# Patient Record
Sex: Female | Born: 1975 | Hispanic: No | Marital: Single | Smoking: Never smoker
Health system: Southern US, Community
[De-identification: ages and names within clinical notes are randomized; demographics above are authoritative.]

## PROBLEM LIST (undated history)

## (undated) DIAGNOSIS — G373 Acute transverse myelitis in demyelinating disease of central nervous system: Secondary | ICD-10-CM

## (undated) DIAGNOSIS — G8220 Paraplegia, unspecified: Secondary | ICD-10-CM

## (undated) DIAGNOSIS — E669 Obesity, unspecified: Secondary | ICD-10-CM

## (undated) DIAGNOSIS — Z8739 Personal history of other diseases of the musculoskeletal system and connective tissue: Secondary | ICD-10-CM

## (undated) HISTORY — PX: OTHER SURGICAL HISTORY: SHX169

## (undated) HISTORY — PX: TUBAL LIGATION: SHX77

## (undated) HISTORY — PX: KNEE ARTHROSCOPY: SHX127

---

## 2012-11-11 ENCOUNTER — Encounter (HOSPITAL_COMMUNITY): Payer: Self-pay

## 2012-11-11 ENCOUNTER — Emergency Department (HOSPITAL_COMMUNITY)
Admission: EM | Admit: 2012-11-11 | Discharge: 2012-11-11 | Disposition: A | Discharge status: Home / Self Care | Payer: Self-pay | Attending: Emergency Medicine | Admitting: Emergency Medicine

## 2012-11-11 DIAGNOSIS — Z3202 Encounter for pregnancy test, result negative: Secondary | ICD-10-CM | POA: Insufficient documentation

## 2012-11-11 DIAGNOSIS — N926 Irregular menstruation, unspecified: Secondary | ICD-10-CM | POA: Insufficient documentation

## 2012-11-11 DIAGNOSIS — R51 Headache: Secondary | ICD-10-CM | POA: Insufficient documentation

## 2012-11-11 DIAGNOSIS — R040 Epistaxis: Secondary | ICD-10-CM | POA: Insufficient documentation

## 2012-11-11 LAB — CBC WITH DIFFERENTIAL/PLATELET
Basophils Absolute: 0 10*3/uL (ref 0.0–0.1)
Basophils Relative: 0 % (ref 0–1)
Eosinophils Absolute: 0 10*3/uL (ref 0.0–0.7)
Eosinophils Relative: 0 % (ref 0–5)
HCT: 37 % (ref 36.0–46.0)
MCH: 25 pg — ABNORMAL LOW (ref 26.0–34.0)
MCHC: 31.1 g/dL (ref 30.0–36.0)
MCV: 80.4 fL (ref 78.0–100.0)
Monocytes Absolute: 0.5 10*3/uL (ref 0.1–1.0)
Platelets: 215 10*3/uL (ref 150–400)
RDW: 13.6 % (ref 11.5–15.5)

## 2012-11-11 NOTE — ED Notes (Signed)
Dr Beaton at bedside 

## 2012-11-11 NOTE — ED Notes (Signed)
Pt c/o daily nose bleeds x2 months, headache "on and off" x1 month, and has not had a menstrual cycle since 09/02/2012. Pt denies using any at home pregnancy test, had a tubal ligation 8 years ago. Pt had her (L) nostril cauterized 2 years ago.

## 2012-11-11 NOTE — ED Notes (Signed)
Pt c/o chronic nosebleeds; pt states she had nosebleed last night and denies bleeding today; pt alert and mentating appropriately; pt denies dizziness and lightheadedness; pt denies numbness and tingling; pt denies headaches and change in vision; pt states left foot tingles before nosebleed starts; pt states she has always had period after tubal ligation and has no had one since 09/02/2012

## 2012-11-11 NOTE — ED Notes (Signed)
Pt alert and mentating appropriately upon d/c; pt given d/c teaching and follow up care instructions; pt verbalizes understanding and has no further questions upon d/c. Pt ambulatory upon d/c. NAD noted upon d/c.

## 2012-11-14 NOTE — ED Provider Notes (Addendum)
History     CSN: [id number]  Arrival date & time 11/11/12  39   First MD Initiated Contact with Patient 11/11/12 1523      Chief Complaint  Patient presents with  . Hematemesis  . Headache    (Consider location/radiation/quality/duration/timing/severity/associated sxs/prior treatment) HPI Pt c/o daily nose bleeds x2 months, headache "on and off" x1 month, and has not had a menstrual cycle since 09/02/2012. Pt denies using any at home pregnancy test, had a tubal ligation 8 years ago. Pt had her (L) nostril cauterized 2 years ago History reviewed. No pertinent past medical history.  Past Surgical History  Procedure Laterality Date  . Knee arthroscopy    . Fibroid tumor    . Tubal ligation      History reviewed. No pertinent family history.  History  Substance Use Topics  . Smoking status: Never Smoker   . Smokeless tobacco: Not on file  . Alcohol Use: No    OB History   Grav Para Term Preterm Abortions TAB SAB Ect Mult Living                  Review of Systems  Constitutional: Negative for fever and fatigue.  HENT: Positive for nosebleeds. Negative for ear pain and congestion.   Hematological: Does not bruise/bleed easily.   Pt c/o daily nose bleeds x2 months, headache "on and off" x1 month, and has not had a menstrual cycle since 09/02/2012. Pt denies using any at home pregnancy test, had a tubal ligation 8 years ago. Pt had her (L) nostril cauterized 2 years ago  Allergies  Review of patient's allergies indicates no known allergies.  Home Medications  No current outpatient prescriptions on file.  BP 103/58  Pulse 72  Temp(Src) 98.8 F (37.1 C) (Oral)  Resp 18  Ht 5' (1.524 m)  Wt 247 lb (112.038 kg)  BMI 48.24 kg/m2  SpO2 100%  LMP 09/02/2012  Physical Exam  Nursing note and vitals reviewed. Constitutional: She is oriented to person, place, and time. She appears well-developed and well-nourished. No distress.  HENT:  Head: Normocephalic and  atraumatic.  Nose: No nasal deformity or nasal septal hematoma. No epistaxis (Friable area noted R septum).  Eyes: Pupils are equal, round, and reactive to light.  Neck: Normal range of motion.  Cardiovascular: Normal rate and intact distal pulses.   Pulmonary/Chest: No respiratory distress.  Abdominal: Normal appearance. She exhibits no distension.  Musculoskeletal: Normal range of motion.  Neurological: She is alert and oriented to person, place, and time. No cranial nerve deficit.  Skin: Skin is warm and dry. No rash noted.  Psychiatric: She has a normal mood and affect. Her behavior is normal.    ED Course  EPISTAXIS MANAGEMENT Date/Time: 11/14/2012 1:10 AM Performed by: Dot Lanes Authorized by: Dot Lanes Consent: Verbal consent obtained. The procedure was performed in an emergent situation. Risks and benefits: risks, benefits and alternatives were discussed Patient understanding: patient states understanding of the procedure being performed Patient identity confirmed: verbally with patient Patient sedated: no Treatment site: right anterior Repair method: suction and silver nitrate Post-procedure assessment: bleeding stopped Treatment complexity: simple Recurrence: recurrence of recent bleed Patient tolerance: Patient tolerated the procedure well with no immediate complications.   (including critical care time)  Labs Reviewed  CBC WITH DIFFERENTIAL - Abnormal; Notable for the following:    Hemoglobin 11.5 (*)    MCH 25.0 (*)    All other components within normal limits  POCT  PREGNANCY, URINE   No results found.   1. Epistaxis       MDM          Dot Lanes, MD 11/14/12 [id number]  Dot Lanes, MD 12/02/12 2121

## 2015-01-19 ENCOUNTER — Emergency Department (HOSPITAL_COMMUNITY)
Admission: EM | Admit: 2015-01-19 | Discharge: 2015-01-19 | Disposition: A | Discharge status: Home / Self Care | Payer: PRIVATE HEALTH INSURANCE | Attending: Emergency Medicine | Admitting: Emergency Medicine

## 2015-01-19 ENCOUNTER — Encounter (HOSPITAL_COMMUNITY): Payer: Self-pay | Admitting: *Deleted

## 2015-01-19 DIAGNOSIS — X58XXXA Exposure to other specified factors, initial encounter: Secondary | ICD-10-CM | POA: Diagnosis not present

## 2015-01-19 DIAGNOSIS — Y998 Other external cause status: Secondary | ICD-10-CM | POA: Insufficient documentation

## 2015-01-19 DIAGNOSIS — R350 Frequency of micturition: Secondary | ICD-10-CM | POA: Diagnosis not present

## 2015-01-19 DIAGNOSIS — Y9289 Other specified places as the place of occurrence of the external cause: Secondary | ICD-10-CM | POA: Insufficient documentation

## 2015-01-19 DIAGNOSIS — Y9389 Activity, other specified: Secondary | ICD-10-CM | POA: Insufficient documentation

## 2015-01-19 DIAGNOSIS — M79605 Pain in left leg: Secondary | ICD-10-CM | POA: Diagnosis not present

## 2015-01-19 DIAGNOSIS — M5410 Radiculopathy, site unspecified: Secondary | ICD-10-CM

## 2015-01-19 DIAGNOSIS — S3992XA Unspecified injury of lower back, initial encounter: Secondary | ICD-10-CM | POA: Diagnosis present

## 2015-01-19 MED ORDER — PREDNISONE 20 MG PO TABS: ORAL_TABLET | ORAL | Status: DC

## 2015-01-19 MED ORDER — METHOCARBAMOL 500 MG PO TABS: 500.0000 mg | ORAL_TABLET | Freq: Two times a day (BID) | ORAL | Status: DC

## 2015-01-19 MED ORDER — HYDROCODONE-ACETAMINOPHEN 5-325 MG PO TABS: 2.0000 | ORAL_TABLET | Freq: Four times a day (QID) | ORAL | Status: DC | PRN

## 2015-01-19 NOTE — ED Notes (Signed)
Pt reports "disc problem" in her back, haven't had any problems in 4 years, Thursday started moving a trash can and "messed her back up." Yesterday began having burning and spasming in back of legs. Now pain is only down L leg. Pt has tried hot bath and OTC meds without relief. No loss of bowel or bladder control.

## 2015-01-19 NOTE — Discharge Instructions (Signed)
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
## 2015-09-19 ENCOUNTER — Emergency Department (HOSPITAL_COMMUNITY): Payer: No Typology Code available for payment source

## 2015-09-19 ENCOUNTER — Emergency Department (HOSPITAL_COMMUNITY)
Admission: EM | Admit: 2015-09-19 | Discharge: 2015-09-20 | Disposition: A | Discharge status: Home / Self Care | Payer: No Typology Code available for payment source | Attending: Emergency Medicine | Admitting: Emergency Medicine

## 2015-09-19 ENCOUNTER — Encounter (HOSPITAL_COMMUNITY): Payer: Self-pay | Admitting: Emergency Medicine

## 2015-09-19 DIAGNOSIS — M545 Low back pain: Secondary | ICD-10-CM | POA: Insufficient documentation

## 2015-09-19 DIAGNOSIS — Z3202 Encounter for pregnancy test, result negative: Secondary | ICD-10-CM | POA: Diagnosis not present

## 2015-09-19 DIAGNOSIS — R109 Unspecified abdominal pain: Secondary | ICD-10-CM

## 2015-09-19 DIAGNOSIS — Z9851 Tubal ligation status: Secondary | ICD-10-CM | POA: Insufficient documentation

## 2015-09-19 LAB — URINALYSIS, ROUTINE W REFLEX MICROSCOPIC
Bilirubin Urine: NEGATIVE
Glucose, UA: NEGATIVE mg/dL
Ketones, ur: NEGATIVE mg/dL
Nitrite: NEGATIVE
Protein, ur: NEGATIVE mg/dL
Specific Gravity, Urine: 1.03 (ref 1.005–1.030)
pH: 5.5 (ref 5.0–8.0)

## 2015-09-19 LAB — URINE MICROSCOPIC-ADD ON

## 2015-09-19 LAB — PREGNANCY, URINE: Preg Test, Ur: NEGATIVE

## 2015-09-19 MED ORDER — KETOROLAC TROMETHAMINE 30 MG/ML IJ SOLN
30.0000 mg | Freq: Once | INTRAMUSCULAR | Status: AC
  Administered 2015-09-19: 30 mg via INTRAMUSCULAR
  Filled 2015-09-19: qty 1

## 2015-09-19 MED ORDER — DEXAMETHASONE SODIUM PHOSPHATE 10 MG/ML IJ SOLN
10.0000 mg | Freq: Once | INTRAMUSCULAR | Status: AC
  Administered 2015-09-19: 10 mg via INTRAMUSCULAR
  Filled 2015-09-19: qty 1

## 2015-09-19 MED ORDER — DEXAMETHASONE SODIUM PHOSPHATE 10 MG/ML IJ SOLN: 10.0000 mg | Freq: Once | INTRAMUSCULAR | Status: DC

## 2015-09-19 MED ORDER — DIAZEPAM 5 MG PO TABS
5.0000 mg | ORAL_TABLET | Freq: Once | ORAL | Status: AC
  Administered 2015-09-19: 5 mg via ORAL
  Filled 2015-09-19: qty 1

## 2015-09-19 NOTE — ED Notes (Signed)
Patient is having pain on the whole left side from her breast down to the left calf. Patient was at work yesterday she felt a sting and then she felt she could not hardly work.

## 2015-09-19 NOTE — ED Provider Notes (Signed)
CSN: [id number]     Arrival date & time 09/19/15  2054 History   First MD Initiated Contact with Patient 09/19/15 2107     Chief Complaint  Patient presents with  . Flank Pain     (Consider location/radiation/quality/duration/timing/severity/associated sxs/prior Treatment) HPI Patient presents with concern of pain in the right lower back, both legs. Pain is severe, sharp, incapacitating. Patient had mild symptoms a few weeks ago, that improved spontaneously. Over the past day she has developed severe, persistent discomfort, with no improvement with ibuprofen. Patient was one similar episode in the distant past. No prior physician evaluation of her pain. She states that she is otherwise generally well, denies medical problems. No incontinence, fever, chills, dyspnea, chest pain. No recent medication changes, diet changes, activity changes. 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  History reviewed. No pertinent past medical history. Past Surgical History  Procedure Laterality Date  . Knee arthroscopy    . Fibroid tumor    . Tubal ligation     History reviewed. No pertinent family history. Social History  Substance Use Topics  . Smoking status: Never Smoker   . Smokeless tobacco: None  . Alcohol Use: No   OB History    No data available     Review of Systems  Constitutional:       Per HPI, otherwise negative  HENT:       Per HPI, otherwise negative  Respiratory:       Per HPI, otherwise negative  Cardiovascular:       Per HPI, otherwise negative  Gastrointestinal: Negative for vomiting.  Endocrine:       Negative aside from HPI  Genitourinary:       Neg aside from HPI   Musculoskeletal:       Per HPI, otherwise negative  Skin: Negative.   Neurological: Positive for weakness. Negative for syncope.      Allergies  Review of patient's allergies indicates no known allergies.  Home Medications   Prior to Admission medications   Medication Sig  Start Date End Date Taking? Authorizing Provider  ibuprofen (ADVIL,MOTRIN) 200 MG tablet Take 800 mg by mouth 2 (two) times daily as needed for moderate pain.   Yes Historical Provider, MD  HYDROcodone-acetaminophen (NORCO/VICODIN) 5-325 MG per tablet Take 2 tablets by mouth every 6 (six) hours as needed for moderate pain. Patient not taking: Reported on 09/19/2015 01/19/15   Domenic Moras, PA-C  methocarbamol (ROBAXIN) 500 MG tablet Take 1 tablet (500 mg total) by mouth 2 (two) times daily. Patient not taking: Reported on 09/19/2015 01/19/15   Domenic Moras, PA-C  predniSONE (DELTASONE) 20 MG tablet 3 tabs po day one, then 2 tabs daily x 4 days Patient not taking: Reported on 09/19/2015 01/19/15   Domenic Moras, PA-C   BP 184/105 mmHg  Pulse 94  Temp(Src) 98.3 F (36.8 C) (Oral)  Resp 20  Ht 5' (1.524 m)  Wt 300 lb (136.079 kg)  BMI 58.59 kg/m2  SpO2 96% Physical Exam  Constitutional: She is oriented to person, place, and time. She appears well-developed and well-nourished. No distress.  Morbidly obese young F, awake, alert, tearful.    HENT:  Head: Normocephalic and atraumatic.  Eyes: Conjunctivae and EOM are normal.  Cardiovascular: Normal rate and regular rhythm.   Pulmonary/Chest: Effort normal and breath sounds normal. No stridor. No respiratory distress.  Abdominal: She exhibits no distension.  Musculoskeletal: She exhibits no edema.  Arms: Neurological: She is alert and oriented to person, place, and time. She displays no atrophy and no tremor. No cranial nerve deficit or sensory deficit. She exhibits normal muscle tone. She displays no seizure activity. Coordination normal.  Skin: Skin is warm and dry.  Psychiatric: She has a normal mood and affect.  Nursing note and vitals reviewed.   ED Course  Procedures (including critical care time) Labs Review Labs Reviewed  URINALYSIS, ROUTINE W REFLEX MICROSCOPIC (NOT AT [facility]) - Abnormal; Notable for the following:    APPearance CLOUDY (*)     Hgb urine dipstick TRACE (*)    Leukocytes, UA LARGE (*)    All other components within normal limits  URINE MICROSCOPIC-ADD ON - Abnormal; Notable for the following:    Squamous Epithelial / LPF 6-30 (*)    Bacteria, UA FEW (*)    All other components within normal limits  PREGNANCY, URINE    Chart review notable for prior visit for similar symptoms, possibly radicular.  12:14 AM CT does not demonstrate kidney stone. On repeat exam the patient is more calm, feels better.   MDM  She presents with severe back pain. The patient describes symptoms most consistent with radiculopathy, she has unexplained hematuria, and given her description of flank pain, CT scans performed to exclude stone. Vital signs, urinalysis otherwise reassuring. Patient improved with medication here, was discharged with similar medication, will follow up with primary care.  Carmin Muskrat, MD 09/20/15 [phone]

## 2015-09-20 MED ORDER — PREDNISONE 20 MG PO TABS
ORAL_TABLET | ORAL | Status: DC
Start: 2015-09-20 — End: 2016-09-29

## 2015-09-20 MED ORDER — DIAZEPAM 2 MG PO TABS: 2.0000 mg | ORAL_TABLET | Freq: Three times a day (TID) | ORAL | Status: DC

## 2015-09-20 NOTE — Discharge Instructions (Signed)
As discussed today's evaluation is largely reassuring. Your pain is likely due to inflammation of the nerves as they leave your spinal column. Please take all medication as directed, but sure to follow-up with your primary care physician. Return here for concerning changes in your condition.

## 2016-09-29 ENCOUNTER — Ambulatory Visit (INDEPENDENT_AMBULATORY_CARE_PROVIDER_SITE_OTHER): Payer: PRIVATE HEALTH INSURANCE | Admitting: Family Medicine

## 2016-09-29 VITALS — BP 142/88 | HR 99 | Temp 98.1°F | Resp 16 | Ht 61.0 in | Wt 280.0 lb

## 2016-09-29 DIAGNOSIS — R208 Other disturbances of skin sensation: Secondary | ICD-10-CM

## 2016-09-29 DIAGNOSIS — M5432 Sciatica, left side: Secondary | ICD-10-CM

## 2016-09-29 DIAGNOSIS — M7989 Other specified soft tissue disorders: Secondary | ICD-10-CM | POA: Diagnosis not present

## 2016-09-29 DIAGNOSIS — M79662 Pain in left lower leg: Secondary | ICD-10-CM | POA: Diagnosis not present

## 2016-09-29 DIAGNOSIS — R7989 Other specified abnormal findings of blood chemistry: Secondary | ICD-10-CM

## 2016-09-29 LAB — GLUCOSE, POCT (MANUAL RESULT ENTRY): POC Glucose: 129 mg/dl — AB (ref 70–99)

## 2016-09-29 MED ORDER — GABAPENTIN 100 MG PO CAPS: 100.0000 mg | ORAL_CAPSULE | Freq: Three times a day (TID) | ORAL | 0 refills | Status: DC

## 2016-09-29 MED ORDER — MELOXICAM 7.5 MG PO TABS: 7.5000 mg | ORAL_TABLET | Freq: Every day | ORAL | 0 refills | Status: DC

## 2016-09-29 NOTE — Patient Instructions (Addendum)
Your symptoms are likely due to sciatica, but will need to look into other causes of calf pain and swelling. I also will check a thyroid tests, electrolytes, and vitamin B12 for the burning/tingling. If the d-dimer is elevated, you will need to have a calf ultrasound and we will schedule that.   Start meloxicam 1 per day, do not combine this with Advil or Aleve over-the-counter. Tylenol is okay to take if needed. Additionally you can start with gabapentin 1 at bedtime, increase up to 3 times per day over the next week as you tolerated that medication. Follow-up in the next 1 week if not improving, sooner if worse.   Sciatica Sciatica is pain, numbness, weakness, or tingling along the path of the sciatic nerve. The sciatic nerve starts in the lower back and runs down the back of each leg. The nerve controls the muscles in the lower leg and in the back of the knee. It also provides feeling (sensation) to the back of the thigh, the lower leg, and the sole of the foot. Sciatica is a symptom of another medical condition that pinches or puts pressure on the sciatic nerve. Generally, sciatica only affects one side of the body. Sciatica usually goes away on its own or with treatment. In some cases, sciatica may keep coming back (recur). What are the causes? This condition is caused by pressure on the sciatic nerve, or pinching of the sciatic nerve. This may be the result of:  A disk in between the bones of the spine (vertebrae) bulging out too far (herniated disk).  Age-related changes in the spinal disks (degenerative disk disease).  A pain disorder that affects a muscle in the buttock (piriformis syndrome).  Extra bone growth (bone spur) near the sciatic nerve.  An injury or break (fracture) of the pelvis.  Pregnancy.  Tumor (rare). What increases the risk? The following factors may make you more likely to develop this condition:  Playing sports that place pressure or stress on the spine, such  as football or weight lifting.  Having poor strength and flexibility.  A history of back injury.  A history of back surgery.  Sitting for long periods of time.  Doing activities that involve repetitive bending or lifting.  Obesity. What are the signs or symptoms? Symptoms can vary from mild to very severe, and they may include:  Any of these problems in the lower back, leg, hip, or buttock:  Mild tingling or dull aches.  Burning sensations.  Sharp pains.  Numbness in the back of the calf or the sole of the foot.  Leg weakness.  Severe back pain that makes movement difficult. These symptoms may get worse when you cough, sneeze, or laugh, or when you sit or stand for long periods of time. Being overweight may also make symptoms worse. In some cases, symptoms may recur over time. How is this diagnosed? This condition may be diagnosed based on:  Your symptoms.  A physical exam. Your health care provider may ask you to do certain movements to check whether those movements trigger your symptoms.  You may have tests, including:  Blood tests.  X-rays.  MRI.  CT scan. How is this treated? In many cases, this condition improves on its own, without any treatment. However, treatment may include:  Reducing or modifying physical activity during periods of pain.  Exercising and stretching to strengthen your abdomen and improve the flexibility of your spine.  Icing and applying heat to the affected area.  Medicines  that help:  To relieve pain and swelling.  To relax your muscles.  Injections of medicines that help to relieve pain, irritation, and inflammation around the sciatic nerve (steroids).  Surgery. Follow these instructions at home: Medicines  Take over-the-counter and prescription medicines only as told by your health care provider.  Do not drive or operate heavy machinery while taking prescription pain medicine. Managing pain  If directed, apply ice to  the affected area.  Put ice in a plastic bag.  Place a towel between your skin and the bag.  Leave the ice on for 20 minutes, 2-3 times a day.  After icing, apply heat to the affected area before you exercise or as often as told by your health care provider. Use the heat source that your health care provider recommends, such as a moist heat pack or a heating pad.  Place a towel between your skin and the heat source.  Leave the heat on for 20-30 minutes.  Remove the heat if your skin turns bright red. This is especially important if you are unable to feel pain, heat, or cold. You may have a greater risk of getting burned. Activity  Return to your normal activities as told by your health care provider. Ask your health care provider what activities are safe for you.  Avoid activities that make your symptoms worse.  Take brief periods of rest throughout the day. Resting in a lying or standing position is usually better than sitting to rest.  When you rest for longer periods, mix in some mild activity or stretching between periods of rest. This will help to prevent stiffness and pain.  Avoid sitting for long periods of time without moving. Get up and move around at least one time each hour.  Exercise and stretch regularly, as told by your health care provider.  Do not lift anything that is heavier than 10 lb (4.5 kg) while you have symptoms of sciatica. When you do not have symptoms, you should still avoid heavy lifting, especially repetitive heavy lifting.  When you lift objects, always use proper lifting technique, which includes:  Bending your knees.  Keeping the load close to your body.  Avoiding twisting. General instructions  Use good posture.  Avoid leaning forward while sitting.  Avoid hunching over while standing.  Maintain a healthy weight. Excess weight puts extra stress on your back and makes it difficult to maintain good posture.  Wear supportive, comfortable  shoes. Avoid wearing high heels.  Avoid sleeping on a mattress that is too soft or too hard. A mattress that is firm enough to support your back when you sleep may help to reduce your pain.  Keep all follow-up visits as told by your health care provider. This is important. Contact a health care provider if:  You have pain that wakes you up when you are sleeping.  You have pain that gets worse when you lie down.  Your pain is worse than you have experienced in the past.  Your pain lasts longer than 4 weeks.  You experience unexplained weight loss. Get help right away if:  You lose control of your bowel or bladder (incontinence).  You have:  Weakness in your lower back, pelvis, buttocks, or legs that gets worse.  Redness or swelling of your back.  A burning sensation when you urinate. This information is not intended to replace advice given to you by your health care provider. Make sure you discuss any questions you have with your health  care provider. Document Released: 07/28/2001 Document Revised: 01/07/2016 Document Reviewed: 04/12/2015 Elsevier Interactive Patient Education  2017 [employer].    IF you received an x-ray today, you will receive an invoice from [facility]. Please contact [facility] at [phone] with questions or concerns regarding your invoice.   IF you received labwork today, you will receive an invoice from [facility]. Please contact [facility] at [phone] with questions or concerns regarding your invoice.   Our billing staff will not be able to assist you with questions regarding bills from these companies.  You will be contacted with the lab results as soon as they are available. The fastest way to get your results is to activate your My Chart account. Instructions are located on the last page of this paperwork. If you have not heard from [place] regarding the results in 2 weeks, please contact this office.

## 2016-09-29 NOTE — Progress Notes (Signed)
By signing my name below, I, Mesha Guinyard, attest that this documentation has been prepared under the direction and in the presence of Merri Ray, MD.  Electronically Signed: Verlee Monte, Medical Scribe. 09/29/16. 3:19 PM.  Subjective:    Patient ID: Traci [name], female    DOB: 08-03-1976, 41 y.o.   MRN: [id number]  HPI Chief Complaint  Patient presents with  . Leg Pain    left calf, x 2 weeks    HPI Comments: Traci [name] is a 41 y.o. female who presents to the [facility] complaining of left leg pain onset 2 weeks ago after waking up. Pain radiates to her buttock down her leg to her calf, she has a burning sensation in her calf, and left calf swelling that's worse at the end of the day since onset. 3 nights ago she had tingling in her left leg and her foot became numb afterwards. Shes taken 2 extra strength-tylenol for a day, 4 tabs aleve a day for 2-3 days, and 4 advil tabs BID for some relief of her upper leg, not her lower leg. Pt has a hx of sciatic pain in he left leg and her sxs feel similar - she also had those symptoms around the same time last year. Pt has not recently traveled, is not a drinker or smoker, and isn't taking birth control pills. She denies PMHx of DVT/PE, heart issues, and DM. Denies extremity weakness, bowel/bladder incontinence, SOB, and chest pain.  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.  FHx: Vit B12 deficiency in her brother who can't walk due to it, uncle, and cousin  Mom and dad both have DM Dad has a Psychologist, forensic  There are no active problems to display for this patient.  No past medical history on file. Past Surgical History:  Procedure Laterality Date  . fibroid tumor    . KNEE ARTHROSCOPY    . TUBAL LIGATION     No Known Allergies Prior to Admission medications   Not on File   Social History   Social History  . Marital status: Single    Spouse name: N/A  . Number of children: N/A  . Years of education: N/A    Occupational History  . Not on file.   Social History Main Topics  . Smoking status: Never Smoker  . Smokeless tobacco: Never Used  . Alcohol use No  . Drug use: No  . Sexual activity: Not on file   Other Topics Concern  . Not on file   Social History Narrative  . No narrative on file   Review of Systems  Respiratory: Negative for shortness of breath.   Cardiovascular: Positive for leg swelling. Negative for chest pain.  Musculoskeletal: Positive for arthralgias and myalgias.  Neurological: Positive for numbness. Negative for weakness.   Objective:  Physical Exam  Constitutional: She appears well-developed and well-nourished. No distress.  HENT:  Head: Normocephalic and atraumatic.  Eyes: Conjunctivae are normal.  Neck: Neck supple.  Cardiovascular: Normal rate.   Pulmonary/Chest: Effort normal.  Musculoskeletal:  Pain with heel walk but functionally able to heel walk Calf circumference measured 15 cm below the patella: 47 on the left, 45 on the right Negative seat straight leg raise Negative homans  Neurological: She is alert.  Reflex Scores:      Patellar reflexes are 1+ on the right side and 1+ on the left side.      Achilles reflexes are 3+ on the right  side and 3+ on the left side. Skin: Skin is warm and dry.  Psychiatric: She has a normal mood and affect. Her behavior is normal.  Nursing note and vitals reviewed.  BP (!) 142/88 (BP Location: Right Arm, Patient Position: Sitting, Cuff Size: Large)   Pulse 99   Temp 98.1 F (36.7 C) (Oral)   Resp 16   Ht 5\' 1"  (1.549 m)   Wt 280 lb (127 kg)   LMP 09/16/2016 (Approximate)   SpO2 96%   BMI 52.91 kg/m    Results for orders placed or performed in visit on 09/29/16  POCT glucose (manual entry)  Result Value Ref Range   POC Glucose 129 (A) 70 - 99 mg/dl   Assessment & Plan:    Traci [name] is a 41 y.o. female Pain of left calf - Plan: D-dimer, quantitative (not at [facility]), VAS [place] LOWER EXTREMITY  VENOUS (DVT), CANCELED: VAS [place] LOWER EXTREMITY VENOUS (DVT)  Dysesthesia - Plan: Basic metabolic panel, TSH, Vitamin B12, POCT glucose (manual entry), meloxicam (MOBIC) 7.5 MG tablet, gabapentin (NEURONTIN) 100 MG capsule  Calf swelling - Plan: D-dimer, quantitative (not at [facility]), VAS [place] LOWER EXTREMITY VENOUS (DVT)  Sciatica, left side - Plan: meloxicam (MOBIC) 7.5 MG tablet, gabapentin (NEURONTIN) 100 MG capsule  Elevated d-dimer - Plan: VAS [place] LOWER EXTREMITY VENOUS (DVT), CANCELED: VAS [place] LOWER EXTREMITY VENOUS (DVT)  Exam and initial symptoms likely due to sciatica, but with slight discrepancy on calf size/calf pain on left, check d-dimer.   -D-dimer was obtained which was borderline elevated, will order ultrasound of lower extremity.   -Symptomatic care discussed initially with meloxicam and Neurontin in case this is a radiculopathy consistent with sciatica. RTC precautions if persistent, and depending on results of ultrasound.   Meds ordered this encounter  Medications  . meloxicam (MOBIC) 7.5 MG tablet    Sig: Take 1 tablet (7.5 mg total) by mouth daily.    Dispense:  30 tablet    Refill:  0  . gabapentin (NEURONTIN) 100 MG capsule    Sig: Take 1 capsule (100 mg total) by mouth 3 (three) times daily. Can start with one at bedtime and increase to 3 times per day over next 1 week as tolerated.    Dispense:  30 capsule    Refill:  0   Patient Instructions   Your symptoms are likely due to sciatica, but will need to look into other causes of calf pain and swelling. I also will check a thyroid tests, electrolytes, and vitamin B12 for the burning/tingling. If the d-dimer is elevated, you will need to have a calf ultrasound and we will schedule that.   Start meloxicam 1 per day, do not combine this with Advil or Aleve over-the-counter. Tylenol is okay to take if needed. Additionally you can start with gabapentin 1 at bedtime, increase up to 3 times per day over the next week as you  tolerated that medication. Follow-up in the next 1 week if not improving, sooner if worse.   Sciatica Sciatica is pain, numbness, weakness, or tingling along the path of the sciatic nerve. The sciatic nerve starts in the lower back and runs down the back of each leg. The nerve controls the muscles in the lower leg and in the back of the knee. It also provides feeling (sensation) to the back of the thigh, the lower leg, and the sole of the foot. Sciatica is a symptom of another medical condition that pinches or puts pressure on  the sciatic nerve. Generally, sciatica only affects one side of the body. Sciatica usually goes away on its own or with treatment. In some cases, sciatica may keep coming back (recur). What are the causes? This condition is caused by pressure on the sciatic nerve, or pinching of the sciatic nerve. This may be the result of:  A disk in between the bones of the spine (vertebrae) bulging out too far (herniated disk).  Age-related changes in the spinal disks (degenerative disk disease).  A pain disorder that affects a muscle in the buttock (piriformis syndrome).  Extra bone growth (bone spur) near the sciatic nerve.  An injury or break (fracture) of the pelvis.  Pregnancy.  Tumor (rare). What increases the risk? The following factors may make you more likely to develop this condition:  Playing sports that place pressure or stress on the spine, such as football or weight lifting.  Having poor strength and flexibility.  A history of back injury.  A history of back surgery.  Sitting for long periods of time.  Doing activities that involve repetitive bending or lifting.  Obesity. What are the signs or symptoms? Symptoms can vary from mild to very severe, and they may include:  Any of these problems in the lower back, leg, hip, or buttock:  Mild tingling or dull aches.  Burning sensations.  Sharp pains.  Numbness in the back of the calf or the sole of  the foot.  Leg weakness.  Severe back pain that makes movement difficult. These symptoms may get worse when you cough, sneeze, or laugh, or when you sit or stand for long periods of time. Being overweight may also make symptoms worse. In some cases, symptoms may recur over time. How is this diagnosed? This condition may be diagnosed based on:  Your symptoms.  A physical exam. Your health care provider may ask you to do certain movements to check whether those movements trigger your symptoms.  You may have tests, including:  Blood tests.  X-rays.  MRI.  CT scan. How is this treated? In many cases, this condition improves on its own, without any treatment. However, treatment may include:  Reducing or modifying physical activity during periods of pain.  Exercising and stretching to strengthen your abdomen and improve the flexibility of your spine.  Icing and applying heat to the affected area.  Medicines that help:  To relieve pain and swelling.  To relax your muscles.  Injections of medicines that help to relieve pain, irritation, and inflammation around the sciatic nerve (steroids).  Surgery. Follow these instructions at home: Medicines  Take over-the-counter and prescription medicines only as told by your health care provider.  Do not drive or operate heavy machinery while taking prescription pain medicine. Managing pain  If directed, apply ice to the affected area.  Put ice in a plastic bag.  Place a towel between your skin and the bag.  Leave the ice on for 20 minutes, 2-3 times a day.  After icing, apply heat to the affected area before you exercise or as often as told by your health care provider. Use the heat source that your health care provider recommends, such as a moist heat pack or a heating pad.  Place a towel between your skin and the heat source.  Leave the heat on for 20-30 minutes.  Remove the heat if your skin turns bright red. This is  especially important if you are unable to feel pain, heat, or cold. You may have a  greater risk of getting burned. Activity  Return to your normal activities as told by your health care provider. Ask your health care provider what activities are safe for you.  Avoid activities that make your symptoms worse.  Take brief periods of rest throughout the day. Resting in a lying or standing position is usually better than sitting to rest.  When you rest for longer periods, mix in some mild activity or stretching between periods of rest. This will help to prevent stiffness and pain.  Avoid sitting for long periods of time without moving. Get up and move around at least one time each hour.  Exercise and stretch regularly, as told by your health care provider.  Do not lift anything that is heavier than 10 lb (4.5 kg) while you have symptoms of sciatica. When you do not have symptoms, you should still avoid heavy lifting, especially repetitive heavy lifting.  When you lift objects, always use proper lifting technique, which includes:  Bending your knees.  Keeping the load close to your body.  Avoiding twisting. General instructions  Use good posture.  Avoid leaning forward while sitting.  Avoid hunching over while standing.  Maintain a healthy weight. Excess weight puts extra stress on your back and makes it difficult to maintain good posture.  Wear supportive, comfortable shoes. Avoid wearing high heels.  Avoid sleeping on a mattress that is too soft or too hard. A mattress that is firm enough to support your back when you sleep may help to reduce your pain.  Keep all follow-up visits as told by your health care provider. This is important. Contact a health care provider if:  You have pain that wakes you up when you are sleeping.  You have pain that gets worse when you lie down.  Your pain is worse than you have experienced in the past.  Your pain lasts longer than 4  weeks.  You experience unexplained weight loss. Get help right away if:  You lose control of your bowel or bladder (incontinence).  You have:  Weakness in your lower back, pelvis, buttocks, or legs that gets worse.  Redness or swelling of your back.  A burning sensation when you urinate. This information is not intended to replace advice given to you by your health care provider. Make sure you discuss any questions you have with your health care provider. Document Released: 07/28/2001 Document Revised: 01/07/2016 Document Reviewed: 04/12/2015 Elsevier Interactive Patient Education  2017 [employer].    IF you received an x-ray today, you will receive an invoice from [facility]. Please contact [facility] at [phone] with questions or concerns regarding your invoice.   IF you received labwork today, you will receive an invoice from [place]. Please contact [facility] at [phone] with questions or concerns regarding your invoice.   Our billing staff will not be able to assist you with questions regarding bills from these companies.  You will be contacted with the lab results as soon as they are available. The fastest way to get your results is to activate your My Chart account. Instructions are located on the last page of this paperwork. If you have not heard from [place] regarding the results in 2 weeks, please contact this office.       I personally performed the services described in this documentation, which was scribed in my presence. The recorded information has been reviewed and considered for accuracy and completeness, addended by me as needed, and agree with information above.  Signed,  Merri Ray, MD [facility] [place].  10/01/16 11:10 AM

## 2016-09-30 LAB — BASIC METABOLIC PANEL
BUN/Creatinine Ratio: 23 (ref 9–23)
BUN: 13 mg/dL (ref 6–24)
CALCIUM: 9.4 mg/dL (ref 8.7–10.2)
CO2: 23 mmol/L (ref 18–29)
CREATININE: 0.56 mg/dL — AB (ref 0.57–1.00)
Chloride: 100 mmol/L (ref 96–106)
GFR calc Af Amer: 133 mL/min/{1.73_m2} (ref >59)
GFR, EST NON AFRICAN AMERICAN: 115 mL/min/{1.73_m2} (ref >59)
GLUCOSE: 128 mg/dL — AB (ref 65–99)
Potassium: 3.9 mmol/L (ref 3.5–5.2)
SODIUM: 140 mmol/L (ref 134–144)

## 2016-09-30 LAB — D-DIMER, QUANTITATIVE (NOT AT ARMC): D-DIMER: 0.56 mg{FEU}/L — AB (ref 0.00–0.49)

## 2016-09-30 LAB — VITAMIN B12: VITAMIN B 12: 577 pg/mL (ref 232–1245)

## 2016-09-30 LAB — TSH: TSH: 1.12 u[IU]/mL (ref 0.450–4.500)

## 2016-09-30 LAB — PLEASE NOTE

## 2016-10-12 ENCOUNTER — Encounter: Payer: Self-pay | Admitting: Family Medicine

## 2016-10-13 ENCOUNTER — Ambulatory Visit (INDEPENDENT_AMBULATORY_CARE_PROVIDER_SITE_OTHER): Payer: PRIVATE HEALTH INSURANCE

## 2016-10-13 ENCOUNTER — Ambulatory Visit (INDEPENDENT_AMBULATORY_CARE_PROVIDER_SITE_OTHER): Payer: PRIVATE HEALTH INSURANCE | Admitting: Family Medicine

## 2016-10-13 ENCOUNTER — Ambulatory Visit (HOSPITAL_COMMUNITY)
Admission: RE | Admit: 2016-10-13 | Discharge: 2016-10-13 | Disposition: A | Discharge status: Home / Self Care | Payer: PRIVATE HEALTH INSURANCE | Source: Ambulatory Visit | Attending: Family Medicine | Admitting: Family Medicine

## 2016-10-13 VITALS — BP 134/92 | HR 87 | Temp 98.2°F | Resp 16 | Ht 61.0 in | Wt 283.0 lb

## 2016-10-13 DIAGNOSIS — R7989 Other specified abnormal findings of blood chemistry: Secondary | ICD-10-CM | POA: Diagnosis not present

## 2016-10-13 DIAGNOSIS — M5442 Lumbago with sciatica, left side: Secondary | ICD-10-CM

## 2016-10-13 DIAGNOSIS — M5441 Lumbago with sciatica, right side: Secondary | ICD-10-CM

## 2016-10-13 DIAGNOSIS — M7989 Other specified soft tissue disorders: Secondary | ICD-10-CM | POA: Insufficient documentation

## 2016-10-13 DIAGNOSIS — M79662 Pain in left lower leg: Secondary | ICD-10-CM | POA: Insufficient documentation

## 2016-10-13 NOTE — Progress Notes (Signed)
Subjective:  By signing my name below, I, Traci [name], attest that this documentation has been prepared under the direction and in the presence of Wendie Agreste, MD Electronically Signed: Ladene Artist, ED Scribe 10/13/2016 at 2:38 PM.   Patient ID: Traci [name], female    DOB: [birth year], 41 y.o.   MRN: [id number]  Chief Complaint  Patient presents with  . Follow-up    Pain of left calf   HPI Traci [name] is a 41 y.o. female who presents to [facility] Care at [facility] for a follow-up on left calf pain. Pt was seen on 09/30/15 with left calf pain and burning sensation. She also reported sciatica-type symptoms. She had a mildly elevated d-dimer so [place] was ordered. Pt was prescribed gabapentin 100 mg tid with plan on increasing if needed and meloxicam 7.5 mg qd at last visit.   Pt states that she never received a call about an [place]. She reports no improvement of left calf pain with taken gabapentin tid. However, she states that she is now experiencing numbness in the first 2 toes on her left and pain that radiates into her buttocks with lying down at night. She also reports sob with walking distances over the past week. Pt denies side effects of gabapentin, chest pain or cough. No fevers, no history of DVT/PE.  There are no active problems to display for this patient.  No past medical history on file. Past Surgical History:  Procedure Laterality Date  . fibroid tumor    . KNEE ARTHROSCOPY    . TUBAL LIGATION     No Known Allergies Prior to Admission medications   Medication Sig Start Date End Date Taking? Authorizing Provider  gabapentin (NEURONTIN) 100 MG capsule Take 1 capsule (100 mg total) by mouth 3 (three) times daily. Can start with one at bedtime and increase to 3 times per day over next 1 week as tolerated. 09/29/16  Yes Wendie Agreste, MD  meloxicam (MOBIC) 7.5 MG tablet Take 1 tablet (7.5 mg total) by mouth daily. 09/29/16  Yes Wendie Agreste, MD   Social History    Social History  . Marital status: Single    Spouse name: N/A  . Number of children: N/A  . Years of education: N/A   Occupational History  . Not on file.   Social History Main Topics  . Smoking status: Never Smoker  . Smokeless tobacco: Never Used  . Alcohol use No  . Drug use: No  . Sexual activity: Not on file   Other Topics Concern  . Not on file   Social History Narrative  . No narrative on file   Review of Systems  Respiratory: Positive for shortness of breath. Negative for cough.   Cardiovascular: Negative for chest pain.  Musculoskeletal: Positive for myalgias.  Neurological: Positive for numbness.      Objective:   Physical Exam  Constitutional: She is oriented to person, place, and time. She appears well-developed and well-nourished. No distress.  HENT:  Head: Normocephalic and atraumatic.  Eyes: Conjunctivae and EOM are normal.  Neck: Neck supple. No tracheal deviation present.  Cardiovascular: Normal rate.   Pulmonary/Chest: Effort normal. No respiratory distress.  Musculoskeletal: Normal range of motion.  Calf circumference 15 cm below patellar: 47 on L, 46 on R. Negative seated straight leg raise.   Neurological: She is alert and oriented to person, place, and time.  Reflex Scores:      Patellar reflexes are 2+ on the right side  and 2+ on the left side.      Achilles reflexes are 2+ on the right side and 2+ on the left side. Skin: Skin is warm and dry.  Psychiatric: She has a normal mood and affect. Her behavior is normal.  Nursing note and vitals reviewed.  Vitals:   10/13/16 1407  BP: (!) 134/92  Pulse: 87  Resp: 16  Temp: 98.2 F (36.8 C)  TempSrc: Oral  SpO2: 98%  Weight: 283 lb (128.4 kg)  Height: 5\' 1"  (1.549 m)   Dg Chest 2 View  Result Date: 10/13/2016 CLINICAL DATA:  Dyspnea over the past week EXAM: CHEST  2 VIEW COMPARISON:  None in PACs FINDINGS: The lungs are adequately inflated. There is no focal infiltrate. There is no  pleural effusion. The heart and pulmonary vascularity are normal. The mediastinum is normal in width. 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 IMPRESSION: There is no pneumonia, CHF, nor other acute cardiopulmonary abnormality. Electronically Signed   By: David  Martinique M.D.   On: 10/13/2016 15:07      Assessment & Plan:  Traci [name] is a 41 y.o. female Pain of left calf  Acute left-sided low back pain with left-sided sciatica - Plan: DG Chest 2 View, DG Lumbar Spine Complete  With persisting calf pain, ultrasound was obtained (verified that we did try to call her for previous ultrasound and unable to reach her).  Ultrasound today did not show any apparent DVT. Symptoms still most likely sciatica. Lumbar spine x-rays noted for some degenerative changes, and she has been apparently told the past that she had a disc issue. Will try meloxicam for another 7-10 days, increase Neurontin to 2 pills at night, then increase to 2 pills in the morning in the next few days. If not significant improving in the next week to 10 days, consider MRI or a short course of prednisone. Sooner if worse.  In regards to shortness of breath, denies fever, denies chest pain, reassuring exam. Advised if any chest pains, worsening shortness of breath, or new or worsening symptoms, to return here or emergency room. Understanding expressed.  No orders of the defined types were placed in this encounter.  Patient Instructions    GO [place] FOR U/S 4PM TODAY  USE [facility]   For sciatica symptoms, can take 2 Neurontin tablets at night, then increase other doses during the day every few days until burning pain or numbness in feet improves.   If ultrasound is normal today, may need follow-up with orthopedist to discuss leg pains and further workup if needed. I will call you with report.   If any worsening shortness of breath return here or ER if needed for other testing.    IF you received an x-ray today, you will receive an invoice from [facility]. Please contact [facility] at [phone] with questions or concerns regarding your invoice.   IF you received labwork today, you will receive an invoice from [place]. Please contact [facility] at [phone] with questions or concerns regarding your invoice.   Our billing staff will not be able to assist you with questions regarding bills from these companies.  You will be contacted with the lab results as soon as they are available. The fastest way to get your results is to activate your My Chart account. Instructions are located on the last page of this paperwork. If you have not heard from [place] regarding the results in 2  weeks, please contact this office.      I personally performed the services described in this documentation, which was scribed in my presence. The recorded information has been reviewed and considered for accuracy and completeness, addended by me as needed, and agree with information above.  Signed,   Merri Ray, MD [facility] [place].  10/13/16 8:30 PM

## 2016-10-13 NOTE — Progress Notes (Signed)
VASCULAR LAB PRELIMINARY  PRELIMINARY  PRELIMINARY  PRELIMINARY  Left lower extremity venous duplex completed.    Preliminary report:  Left:  No evidence of DVT, superficial thrombosis, or Baker's cyst.  Traci [name], RVS 10/13/2016, 4:51 PM

## 2016-10-13 NOTE — Patient Instructions (Addendum)
  GO TO [place] FOR U/S 4PM TODAY  [facility]   For sciatica symptoms, can take 2 Neurontin tablets at night, then increase other doses during the day every few days until burning pain or numbness in feet improves.   If ultrasound is normal today, may need follow-up with orthopedist to discuss leg pains and further workup if needed. I will call you with report.   If any worsening shortness of breath return here or ER if needed for other testing.   IF you received an x-ray today, you will receive an invoice from [facility]. Please contact [facility] at [phone] with questions or concerns regarding your invoice.   IF you received labwork today, you will receive an invoice from [place]. Please contact [facility] at [phone] with questions or concerns regarding your invoice.   Our billing staff will not be able to assist you with questions regarding bills from these companies.  You will be contacted with the lab results as soon as they are available. The fastest way to get your results is to activate your My Chart account. Instructions are located on the last page of this paperwork. If you have not heard from [place] regarding the results in 2 weeks, please contact this office.

## 2016-12-17 ENCOUNTER — Encounter: Payer: Self-pay | Admitting: Family Medicine

## 2016-12-17 ENCOUNTER — Ambulatory Visit (INDEPENDENT_AMBULATORY_CARE_PROVIDER_SITE_OTHER): Payer: PRIVATE HEALTH INSURANCE | Admitting: Family Medicine

## 2016-12-17 VITALS — BP 124/82 | HR 89 | Temp 98.5°F | Resp 16 | Ht 60.0 in | Wt 282.0 lb

## 2016-12-17 DIAGNOSIS — M545 Low back pain: Secondary | ICD-10-CM

## 2016-12-17 DIAGNOSIS — M5432 Sciatica, left side: Secondary | ICD-10-CM

## 2016-12-17 DIAGNOSIS — R208 Other disturbances of skin sensation: Secondary | ICD-10-CM | POA: Diagnosis not present

## 2016-12-17 MED ORDER — GABAPENTIN 100 MG PO CAPS: ORAL_CAPSULE | ORAL | 0 refills | Status: DC

## 2016-12-17 MED ORDER — MELOXICAM 7.5 MG PO TABS: 7.5000 mg | ORAL_TABLET | Freq: Every day | ORAL | 0 refills | Status: DC

## 2016-12-17 MED ORDER — GABAPENTIN 100 MG PO CAPS: 100.0000 mg | ORAL_CAPSULE | Freq: Three times a day (TID) | ORAL | 0 refills | Status: DC

## 2016-12-17 MED ORDER — CYCLOBENZAPRINE HCL 10 MG PO TABS: 10.0000 mg | ORAL_TABLET | Freq: Three times a day (TID) | ORAL | 0 refills | Status: DC | PRN

## 2016-12-17 NOTE — Progress Notes (Signed)
Glendi Mohiuddin is a 41 y.o. female who presents to Primary Care at [facility] today for back pain:  1.  Back pain:  Present x 1 week.  Left sided back pain.  Describes as "aching" alternating with "sharp stabbing" pain in her low back pain.  States that she sat up in bed to use the bathroom in the middle of the night, and when sitting up to get out of bed had sudden sharp stabbing pain at that time.  Pain has persisted through the week.  She has been using 600 - 800 mg of ibuprofen 2-3 x per day which has helped somewhat with her pain.  Describes as 8/10.  She is obese and does little   Has history of low back pain previously.  States back "goes out" once a year or so but then she is good for some time.  However, recently had exacerbation of her back pain in Feb of this year.  She was treated at that appointment with Gabapentin (see below) and Mobic as analgesic and anti-inflammatory.    Also with sciatic nerve pain the radiates to her Left leg and calf.  Describes this as "burning/tingling."  Previously prescribed gabapentin to help with this, which does help.  However she hasn't had this for about a month.  No LE weakness or changes in gait.  No motor weakness/decreased sensation BL LE's.    No fevers or chills.  No dysuria, hematuria, urinary frequency, incontinence of bladder or bowel.    ROS as above.   PMH reviewed. Patient is a nonsmoker.   No past medical history on file. Past Surgical History:  Procedure Laterality Date  . fibroid tumor    . KNEE ARTHROSCOPY    . TUBAL LIGATION      Medications reviewed. Current Outpatient Prescriptions  Medication Sig Dispense Refill  . gabapentin (NEURONTIN) 100 MG capsule Take 1 capsule (100 mg total) by mouth 3 (three) times daily. Can start with one at bedtime and increase to 3 times per day over next 1 week as tolerated. 30 capsule 0  . meloxicam (MOBIC) 7.5 MG tablet Take 1 tablet (7.5 mg total) by mouth daily. 30 tablet 0   No current  facility-administered medications for this visit.      Physical Exam:  BP (!) 141/83   Pulse 89   Temp 98.5 F (36.9 C) (Oral)   Resp 16   Ht 5' (1.524 m)   Wt 282 lb (127.9 kg)   SpO2 99%   BMI 55.07 kg/m  Gen:  Alert, cooperative patient who appears stated age in no acute distress.  Vital signs reviewed. HEENT: EOMI,  MMM Pulm:  Clear to auscultation bilaterally with good air movement.  No wheezes or rales noted.   Cardiac:  Regular rate and rhythm without murmur auscultated.  Good S1/S2. Back:  Normal skin, Spine with normal alignment and no deformity.  No tenderness to vertebral process palpation.  Paraspinous muscles are tender BL lumbar region to palpation.  Range of motion is full at neck and decreased forward flexion lumbar sacral regions due to pain.  Straight leg raise is positive for Left sided back pain.  (She has chronic Right leg pain and is unable to tolerate with SLR on the Right.) Neuro:  Sensation 5/5 bilateral lower extremities.  Strength on Right Leg 5/5 knee extension and flexion.  Hip flexor strength is 4/5 on Right.  Hip flexor strength is 3/5 on the Left.  Strength on Left leg  5/5 knee extension and flexion Patellar and Achilles  DTR's +1 Achiles BL, unable to elicit paterllar BL.  She ambulates with waddling gait, secondary more to obesity than pain.    Assessment and Plan:  1.  Lumbago:  - Most likely lumbar strain, though possibly also with piriformis syndrome with sciatica as well.  - Plan to treat with flexeril for muscle relaxer, mobic refill (which helped her previously with back pain). - Gabapentin for sciatica.    - Heat and massage.  She herself was already planning on starting massage - Red flags and return precautions provided.    - Call or return to clinic prn if these symptoms worsen or fail to improve in next 2 weeks.  Return immediately if worsening.

## 2016-12-17 NOTE — Patient Instructions (Addendum)
You have a bad muscle spasm in your lower back.  This is causing the pain you are feeling.  Take the Meloxicam once daily for pain relief and anti-inflammatory effects.  Use the Gabapentin for the tingling you are having in your legs.    Take the Flexeril as a muscle relaxer at night. This may make you drowsy and if it does do not take it during the day.  Heat and massage are also great to help relieve the pain.  This can last for the next 7-10 days. If you're still having issues in the next 2 weeks come back and see [place].  If you start having worsening pain despite the treatment don't wait and come back immediately.     IF you received an x-ray today, you will receive an invoice from [facility]. Please contact [facility] at [phone] with questions or concerns regarding your invoice.   IF you received labwork today, you will receive an invoice from [facility]. Please contact [facility] at [phone] with questions or concerns regarding your invoice.   Our billing staff will not be able to assist you with questions regarding bills from these companies.  You will be contacted with the lab results as soon as they are available. The fastest way to get your results is to activate your My Chart account. Instructions are located on the last page of this paperwork. If you have not heard from [place] regarding the results in 2 weeks, please contact this office.

## 2016-12-20 ENCOUNTER — Emergency Department (HOSPITAL_COMMUNITY): Payer: PRIVATE HEALTH INSURANCE

## 2016-12-20 ENCOUNTER — Inpatient Hospital Stay (HOSPITAL_COMMUNITY)
Admission: EM | Admit: 2016-12-20 | Discharge: 2016-12-29 | DRG: 098 | Disposition: A | Discharge status: Rehabilitation Facility | Payer: PRIVATE HEALTH INSURANCE | Attending: Internal Medicine | Admitting: Internal Medicine

## 2016-12-20 ENCOUNTER — Encounter (HOSPITAL_COMMUNITY): Payer: Self-pay | Admitting: Emergency Medicine

## 2016-12-20 DIAGNOSIS — M5137 Other intervertebral disc degeneration, lumbosacral region: Secondary | ICD-10-CM | POA: Diagnosis not present

## 2016-12-20 DIAGNOSIS — G8220 Paraplegia, unspecified: Secondary | ICD-10-CM | POA: Diagnosis present

## 2016-12-20 DIAGNOSIS — R5081 Fever presenting with conditions classified elsewhere: Secondary | ICD-10-CM | POA: Diagnosis not present

## 2016-12-20 DIAGNOSIS — R339 Retention of urine, unspecified: Secondary | ICD-10-CM

## 2016-12-20 DIAGNOSIS — E871 Hypo-osmolality and hyponatremia: Secondary | ICD-10-CM | POA: Diagnosis not present

## 2016-12-20 DIAGNOSIS — R739 Hyperglycemia, unspecified: Secondary | ICD-10-CM | POA: Diagnosis not present

## 2016-12-20 DIAGNOSIS — Z806 Family history of leukemia: Secondary | ICD-10-CM | POA: Diagnosis not present

## 2016-12-20 DIAGNOSIS — D62 Acute posthemorrhagic anemia: Secondary | ICD-10-CM | POA: Diagnosis not present

## 2016-12-20 DIAGNOSIS — E1165 Type 2 diabetes mellitus with hyperglycemia: Secondary | ICD-10-CM | POA: Diagnosis present

## 2016-12-20 DIAGNOSIS — T380X5A Adverse effect of glucocorticoids and synthetic analogues, initial encounter: Secondary | ICD-10-CM | POA: Diagnosis not present

## 2016-12-20 DIAGNOSIS — R1084 Generalized abdominal pain: Secondary | ICD-10-CM | POA: Diagnosis not present

## 2016-12-20 DIAGNOSIS — M48061 Spinal stenosis, lumbar region without neurogenic claudication: Secondary | ICD-10-CM | POA: Diagnosis present

## 2016-12-20 DIAGNOSIS — N319 Neuromuscular dysfunction of bladder, unspecified: Secondary | ICD-10-CM | POA: Diagnosis present

## 2016-12-20 DIAGNOSIS — E1169 Type 2 diabetes mellitus with other specified complication: Secondary | ICD-10-CM

## 2016-12-20 DIAGNOSIS — R531 Weakness: Secondary | ICD-10-CM

## 2016-12-20 DIAGNOSIS — Z9851 Tubal ligation status: Secondary | ICD-10-CM

## 2016-12-20 DIAGNOSIS — R740 Nonspecific elevation of levels of transaminase and lactic acid dehydrogenase [LDH]: Secondary | ICD-10-CM | POA: Diagnosis not present

## 2016-12-20 DIAGNOSIS — M792 Neuralgia and neuritis, unspecified: Secondary | ICD-10-CM | POA: Diagnosis not present

## 2016-12-20 DIAGNOSIS — Z79899 Other long term (current) drug therapy: Secondary | ICD-10-CM

## 2016-12-20 DIAGNOSIS — K649 Unspecified hemorrhoids: Secondary | ICD-10-CM | POA: Diagnosis not present

## 2016-12-20 DIAGNOSIS — G8221 Paraplegia, complete: Secondary | ICD-10-CM | POA: Diagnosis not present

## 2016-12-20 DIAGNOSIS — M5116 Intervertebral disc disorders with radiculopathy, lumbar region: Secondary | ICD-10-CM

## 2016-12-20 DIAGNOSIS — G0491 Myelitis, unspecified: Secondary | ICD-10-CM | POA: Diagnosis not present

## 2016-12-20 DIAGNOSIS — Z8349 Family history of other endocrine, nutritional and metabolic diseases: Secondary | ICD-10-CM

## 2016-12-20 DIAGNOSIS — R202 Paresthesia of skin: Secondary | ICD-10-CM

## 2016-12-20 DIAGNOSIS — M5432 Sciatica, left side: Secondary | ICD-10-CM | POA: Diagnosis present

## 2016-12-20 DIAGNOSIS — G379 Demyelinating disease of central nervous system, unspecified: Secondary | ICD-10-CM | POA: Diagnosis present

## 2016-12-20 DIAGNOSIS — I82492 Acute embolism and thrombosis of other specified deep vein of left lower extremity: Secondary | ICD-10-CM | POA: Diagnosis not present

## 2016-12-20 DIAGNOSIS — R200 Anesthesia of skin: Secondary | ICD-10-CM

## 2016-12-20 DIAGNOSIS — R402143 Coma scale, eyes open, spontaneous, at hospital admission: Secondary | ICD-10-CM | POA: Diagnosis present

## 2016-12-20 DIAGNOSIS — G373 Acute transverse myelitis in demyelinating disease of central nervous system: Secondary | ICD-10-CM | POA: Diagnosis present

## 2016-12-20 DIAGNOSIS — K592 Neurogenic bowel, not elsewhere classified: Secondary | ICD-10-CM | POA: Diagnosis present

## 2016-12-20 DIAGNOSIS — R29898 Other symptoms and signs involving the musculoskeletal system: Secondary | ICD-10-CM

## 2016-12-20 DIAGNOSIS — R402363 Coma scale, best motor response, obeys commands, at hospital admission: Secondary | ICD-10-CM | POA: Diagnosis present

## 2016-12-20 DIAGNOSIS — N390 Urinary tract infection, site not specified: Secondary | ICD-10-CM | POA: Diagnosis not present

## 2016-12-20 DIAGNOSIS — R402253 Coma scale, best verbal response, oriented, at hospital admission: Secondary | ICD-10-CM | POA: Diagnosis present

## 2016-12-20 DIAGNOSIS — M5126 Other intervertebral disc displacement, lumbar region: Secondary | ICD-10-CM | POA: Diagnosis present

## 2016-12-20 DIAGNOSIS — Z6841 Body Mass Index (BMI) 40.0 and over, adult: Secondary | ICD-10-CM

## 2016-12-20 DIAGNOSIS — E669 Obesity, unspecified: Secondary | ICD-10-CM | POA: Diagnosis not present

## 2016-12-20 DIAGNOSIS — Z960 Presence of urogenital implants: Secondary | ICD-10-CM | POA: Diagnosis not present

## 2016-12-20 DIAGNOSIS — K5900 Constipation, unspecified: Secondary | ICD-10-CM | POA: Diagnosis present

## 2016-12-20 DIAGNOSIS — Z833 Family history of diabetes mellitus: Secondary | ICD-10-CM

## 2016-12-20 DIAGNOSIS — I10 Essential (primary) hypertension: Secondary | ICD-10-CM | POA: Diagnosis present

## 2016-12-20 DIAGNOSIS — E0965 Drug or chemical induced diabetes mellitus with hyperglycemia: Secondary | ICD-10-CM | POA: Diagnosis not present

## 2016-12-20 DIAGNOSIS — A498 Other bacterial infections of unspecified site: Secondary | ICD-10-CM | POA: Diagnosis not present

## 2016-12-20 DIAGNOSIS — R112 Nausea with vomiting, unspecified: Secondary | ICD-10-CM | POA: Diagnosis not present

## 2016-12-20 DIAGNOSIS — M7989 Other specified soft tissue disorders: Secondary | ICD-10-CM | POA: Diagnosis not present

## 2016-12-20 DIAGNOSIS — N39490 Overflow incontinence: Secondary | ICD-10-CM | POA: Diagnosis present

## 2016-12-20 DIAGNOSIS — M4714 Other spondylosis with myelopathy, thoracic region: Secondary | ICD-10-CM | POA: Diagnosis not present

## 2016-12-20 HISTORY — DX: Personal history of other diseases of the musculoskeletal system and connective tissue: Z87.39

## 2016-12-20 HISTORY — DX: Obesity, unspecified: E66.9

## 2016-12-20 LAB — CBC WITH DIFFERENTIAL/PLATELET
BASOS PCT: 0 %
Basophils Absolute: 0 10*3/uL (ref 0.0–0.1)
Eosinophils Absolute: 0.1 10*3/uL (ref 0.0–0.7)
Eosinophils Relative: 1 %
HEMATOCRIT: 41.9 % (ref 36.0–46.0)
Hemoglobin: 13.4 g/dL (ref 12.0–15.0)
LYMPHS PCT: 34 %
Lymphs Abs: 1.8 10*3/uL (ref 0.7–4.0)
MCH: 25.1 pg — AB (ref 26.0–34.0)
MCHC: 32 g/dL (ref 30.0–36.0)
MCV: 78.5 fL (ref 78.0–100.0)
MONO ABS: 0.3 10*3/uL (ref 0.1–1.0)
MONOS PCT: 6 %
NEUTROS ABS: 3.1 10*3/uL (ref 1.7–7.7)
Neutrophils Relative %: 59 %
Platelets: 232 10*3/uL (ref 150–400)
RBC: 5.34 MIL/uL — ABNORMAL HIGH (ref 3.87–5.11)
RDW: 14 % (ref 11.5–15.5)
WBC: 5.2 10*3/uL (ref 4.0–10.5)

## 2016-12-20 LAB — BASIC METABOLIC PANEL
Anion gap: 8 (ref 5–15)
BUN: 5 mg/dL — ABNORMAL LOW (ref 6–20)
CALCIUM: 9.1 mg/dL (ref 8.9–10.3)
CO2: 24 mmol/L (ref 22–32)
CREATININE: 0.6 mg/dL (ref 0.44–1.00)
Chloride: 103 mmol/L (ref 101–111)
GFR calc Af Amer: >60 mL/min (ref >60)
GFR calc non Af Amer: >60 mL/min (ref >60)
GLUCOSE: 175 mg/dL — AB (ref 65–99)
Potassium: 4 mmol/L (ref 3.5–5.1)
Sodium: 135 mmol/L (ref 135–145)

## 2016-12-20 LAB — URINALYSIS, ROUTINE W REFLEX MICROSCOPIC
BACTERIA UA: NONE SEEN
Bilirubin Urine: NEGATIVE
Glucose, UA: >=500 mg/dL — AB
HGB URINE DIPSTICK: NEGATIVE
KETONES UR: NEGATIVE mg/dL
Leukocytes, UA: NEGATIVE
Nitrite: NEGATIVE
PROTEIN: NEGATIVE mg/dL
Specific Gravity, Urine: 1.02 (ref 1.005–1.030)
Squamous Epithelial / LPF: NONE SEEN
pH: 6 (ref 5.0–8.0)

## 2016-12-20 LAB — VITAMIN B12: Vitamin B-12: 1561 pg/mL — ABNORMAL HIGH (ref 180–914)

## 2016-12-20 LAB — I-STAT CG4 LACTIC ACID, ED: Lactic Acid, Venous: 1.37 mmol/L (ref 0.5–1.9)

## 2016-12-20 MED ORDER — ENOXAPARIN SODIUM 40 MG/0.4ML ~~LOC~~ SOLN
40.0000 mg | SUBCUTANEOUS | Status: DC
  Administered 2016-12-20 – 2016-12-21 (×2): 40 mg via SUBCUTANEOUS
  Filled 2016-12-20 (×2): qty 0.4

## 2016-12-20 MED ORDER — GADOBENATE DIMEGLUMINE 529 MG/ML IV SOLN
20.0000 mL | Freq: Once | INTRAVENOUS | Status: AC | PRN
  Administered 2016-12-20: 20 mL via INTRAVENOUS

## 2016-12-20 MED ORDER — SODIUM CHLORIDE 0.9 % IV SOLN
INTRAVENOUS | Status: AC
  Administered 2016-12-20 (×2): via INTRAVENOUS

## 2016-12-20 MED ORDER — DEXTROSE 5 % IV SOLN
10.0000 mg/kg | Freq: Three times a day (TID) | INTRAVENOUS | Status: DC
  Administered 2016-12-20 – 2016-12-22 (×7): 1280 mg via INTRAVENOUS
  Filled 2016-12-20 (×2): qty 25.6
  Filled 2016-12-20: qty 20
  Filled 2016-12-20 (×2): qty 25.6
  Filled 2016-12-20: qty 20
  Filled 2016-12-20: qty 25.6
  Filled 2016-12-20: qty 20
  Filled 2016-12-20: qty 25.6

## 2016-12-20 MED ORDER — MELOXICAM 7.5 MG PO TABS
7.5000 mg | ORAL_TABLET | Freq: Every day | ORAL | Status: DC
  Administered 2016-12-20 – 2016-12-29 (×10): 7.5 mg via ORAL
  Filled 2016-12-20 (×10): qty 1

## 2016-12-20 MED ORDER — SODIUM CHLORIDE 0.9 % IV SOLN
1000.0000 mg | Freq: Once | INTRAVENOUS | Status: AC
  Administered 2016-12-20: 1000 mg via INTRAVENOUS
  Filled 2016-12-20: qty 8

## 2016-12-20 MED ORDER — OXYCODONE-ACETAMINOPHEN 5-325 MG PO TABS
1.0000 | ORAL_TABLET | ORAL | Status: DC | PRN
  Administered 2016-12-20 – 2016-12-21 (×3): 1 via ORAL
  Filled 2016-12-20 (×4): qty 1

## 2016-12-20 MED ORDER — GABAPENTIN 300 MG PO CAPS
300.0000 mg | ORAL_CAPSULE | Freq: Three times a day (TID) | ORAL | Status: DC
  Administered 2016-12-20 – 2016-12-29 (×27): 300 mg via ORAL
  Filled 2016-12-20 (×27): qty 1

## 2016-12-20 MED ORDER — SODIUM CHLORIDE 0.9 % IV SOLN
1000.0000 mg | INTRAVENOUS | Status: AC
  Administered 2016-12-21 – 2016-12-24 (×4): 1000 mg via INTRAVENOUS
  Filled 2016-12-20 (×4): qty 8

## 2016-12-20 MED ORDER — ONDANSETRON HCL 4 MG/2ML IJ SOLN
4.0000 mg | Freq: Once | INTRAMUSCULAR | Status: AC
  Administered 2016-12-20: 4 mg via INTRAVENOUS
  Filled 2016-12-20: qty 2

## 2016-12-20 MED ORDER — MORPHINE SULFATE (PF) 4 MG/ML IV SOLN
4.0000 mg | Freq: Once | INTRAVENOUS | Status: AC
  Administered 2016-12-20: 4 mg via INTRAVENOUS
  Filled 2016-12-20: qty 1

## 2016-12-20 MED ORDER — SODIUM CHLORIDE 0.9 % IV SOLN
INTRAVENOUS | Status: DC
  Administered 2016-12-20: 02:00:00 via INTRAVENOUS
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
## 2016-12-20 NOTE — ED Notes (Signed)
Pt placed on bedpan. Urinary incontinence noted to clothing and bed linen.  Pt unaware of this incontinence. No urine acquired.  Linens changed. Pt continues with incontinence and states she cannot feel herself urinating.

## 2016-12-20 NOTE — ED Notes (Signed)
Pt. currently at MRI .  
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
## 2016-12-20 NOTE — ED Triage Notes (Addendum)
Patient arrived with EMS from home reports bilateral lower legs numbness/" burning" for 1 week worse this evening unrelieved by prescriptions Flexeril/ Meloxicam and gabapentin , denies injury / ambulatory .Pt. added low back pain for several days . Hypertensive at arrival.
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
## 2016-12-20 NOTE — Consult Note (Signed)
Reason for Consult: L4-5 herniated disc, spinal stenosis, paraplegia, thoracic spinal cord abnormality Referring Physician: Dr. Alta Corning Traci [name] is an 41 y.o. female.  HPI: The patient is a 41 year old obese Hispanic female who has had some "left sciatica" over the last couple months. She has seen her primary doctor, Dr. Nyoka Cowden, and was worked up with lumbar x-rays about 2 months ago which demonstrated some degenerative changes. The patient was treated with medications including Mobic.  The patient began noticing bilateral lower extremity numbness and weakness while taking a shower yesterday. This progressed to paraplegia. The patient was brought to the ER. She was noted to have perineal numbness, decreased rectal tone, paraplegia and urinary retention. A Foley catheter was placed. The patient was initially worked up with a lumbar MRI with and without contrast which demonstrated a left L4-5 herniated disc with moderate spinal stenosis. I spoke with Dr. Leonides Schanz and told her I didn't think this explained her clinical situation and we proceeded with a thoracic MRI. This demonstrated high signal within the patient's caudal spinal cord. Dr. Cheral Marker, neurology saw the patient and she is being admitted.  I saw the patient about 0730. She relates the above history. She complains of back pain but this seems more or less at the baseline. She describes numbness in her bilateral lower extremities which begins in her proximal anterior thighs. She can't feel anything below her knees. She has perineal numbness.  Past Medical History:  Diagnosis Date  . H/O degenerative disc disease   . Obesity     Past Surgical History:  Procedure Laterality Date  . fibroid tumor    . KNEE ARTHROSCOPY    . TUBAL LIGATION      No family history on file.  Social History:  reports that she has never smoked. She has never used smokeless tobacco. She reports that she does not drink alcohol or use drugs.  Allergies: No Known  Allergies  Medications:  I have reviewed the patient's current medications. Prior to Admission:  (Not in a hospital admission) Scheduled:  Continuous: . sodium chloride 125 mL/hr at 12/20/16 0131  . acyclovir     PRN: Anti-infectives    Start     Dose/Rate Route Frequency Ordered Stop   12/20/16 0900  acyclovir (ZOVIRAX) 1,280 mg in dextrose 5 % 250 mL IVPB     10 mg/kg  127.9 kg 275.6 mL/hr over 60 Minutes Intravenous Every 8 hours 12/20/16 0852         Results for orders placed or performed during the hospital encounter of 12/20/16 (from the past 48 hour(s))  CBC with Differential     Status: Abnormal   Collection Time: 12/20/16  1:18 AM  Result Value Ref Range   WBC 5.2 4.0 - 10.5 K/uL   RBC 5.34 (H) 3.87 - 5.11 MIL/uL   Hemoglobin 13.4 12.0 - 15.0 g/dL   HCT 41.9 36.0 - 46.0 %   MCV 78.5 78.0 - 100.0 fL   MCH 25.1 (L) 26.0 - 34.0 pg   MCHC 32.0 30.0 - 36.0 g/dL   RDW 14.0 11.5 - 15.5 %   Platelets 232 150 - 400 K/uL   Neutrophils Relative % 59 %   Neutro Abs 3.1 1.7 - 7.7 K/uL   Lymphocytes Relative 34 %   Lymphs Abs 1.8 0.7 - 4.0 K/uL   Monocytes Relative 6 %   Monocytes Absolute 0.3 0.1 - 1.0 K/uL   Eosinophils Relative 1 %   Eosinophils Absolute 0.1 0.0 -  0.7 K/uL   Basophils Relative 0 %   Basophils Absolute 0.0 0.0 - 0.1 K/uL  Basic metabolic panel     Status: Abnormal   Collection Time: 12/20/16  1:18 AM  Result Value Ref Range   Sodium 135 135 - 145 mmol/L   Potassium 4.0 3.5 - 5.1 mmol/L   Chloride 103 101 - 111 mmol/L   CO2 24 22 - 32 mmol/L   Glucose, Bld 175 (H) 65 - 99 mg/dL   BUN 5 (L) 6 - 20 mg/dL   Creatinine, Ser 0.60 0.44 - 1.00 mg/dL   Calcium 9.1 8.9 - 10.3 mg/dL   GFR calc non Af Amer >60 >60 mL/min   GFR calc Af Amer >60 >60 mL/min    Comment: (NOTE) The eGFR has been calculated using the CKD EPI equation. This calculation has not been validated in all clinical situations. eGFR's persistently <60 mL/min signify possible Chronic  Kidney Disease.    Anion gap 8 5 - 15  I-Stat CG4 Lactic Acid, ED     Status: None   Collection Time: 12/20/16  1:53 AM  Result Value Ref Range   Lactic Acid, Venous 1.37 0.5 - 1.9 mmol/L    Mr Thoracic Spine Wo Contrast  Result Date: 12/20/2016 CLINICAL DATA:  Urinary incontinence, thoracic spine pain. EXAM: MRI THORACIC SPINE WITHOUT CONTRAST TECHNIQUE: Multiplanar, multisequence MR imaging of the thoracic spine was performed. No intravenous contrast was administered. COMPARISON:  None. FINDINGS: Large body habitus results in decreased signal to noise ratio, further degraded by mild motion. ALIGNMENT: Maintenance of the thoracic kyphosis. No malalignment. VERTEBRAE/DISCS: Vertebral bodies are intact. Intervertebral discs morphology and signal are normal. Mild mineralization mid thoracic discs. Multilevel mild chronic discogenic endplate changes with minimal acute component at T7-8. No suspicious bone marrow signal. CORD: Abnormal expansile T2 bright signal within the from T10 to the conus medullaris, predominately involving the lateral columns. Contrast was administered for prior examination, however there is no T1 shortening within the lesion. Faint bright STIR signal thoracic spine from T for 2 T5-6 PREVERTEBRAL AND PARASPINAL SOFT TISSUES:  Normal. DISC LEVELS: T1-2 through T3-4: No disc bulge, canal stenosis nor neural foraminal narrowing. T4-5, T5-6: Small broad-based disc bulge without canal stenosis. No neural foraminal narrowing. T6-7: Small broad-based LEFT central disc protrusion without canal stenosis. Mild LEFT neural foraminal narrowing. T7-8: Small broad-based disc bulge without canal stenosis or neural foraminal narrowing. T8-9, T9-10, T10-11 and T11-12: No significant disc bulge, canal stenosis or neural foraminal narrowing. IMPRESSION: Discontinuous abnormal signal within the mid thoracic spinal cord to the conus medullaris concerning for demyelination. Recommend postcontrast imaging  and, imaging of the remaining neuro access when clinically able and, 24 hours after last gadolinium contrast administration. Degenerative change of the thoracic spine without canal stenosis. Mild LEFT T6-7 neural foraminal narrowing. Acute findings discussed with and reconfirmed by Dr.KRISTEN WARD on 12/20/2016 at 6:40 am. Electronically Signed   By: Elon Alas M.D.   On: 12/20/2016 06:45   Mr Lumbar Spine W Wo Contrast  Addendum Date: 12/20/2016   ADDENDUM REPORT: 12/20/2016 05:54 ADDENDUM: Upon re- review, there is likely moderate canal stenosis at L4-5, AP dimension of the canal is 5 mm. Thecal sac is 3-4 mm. Electronically Signed   By: Elon Alas M.D.   On: 12/20/2016 05:54   Result Date: 12/20/2016 CLINICAL DATA:  Bilateral lower extremity numbness and burning sensation for week, not improved with medication. EXAM: MRI LUMBAR SPINE WITHOUT AND WITH CONTRAST TECHNIQUE: Multiplanar  and multiecho pulse sequences of the lumbar spine were obtained without and with intravenous contrast. CONTRAST:  48m MULTIHANCE GADOBENATE DIMEGLUMINE 529 MG/ML IV SOLN COMPARISON:  Lumbar spine radiographs October 13, 2016 FINDINGS: SEGMENTATION: For the purposes of this report, the last well-formed intervertebral disc will be described as L5-S1. ALIGNMENT: Maintenance of the lumbar lordosis. No malalignment. VERTEBRAE:Vertebral bodies are intact. Moderate L4-5 and L5-S1 disc height loss with acute on chronic enhancing mild discogenic endplate changes LH8-5 mild subacute discogenic endplate changes LI7-P8 No suspicious osseous or disc enhancement. CONUS MEDULLARIS: Conus medullaris terminates at T12-L1 and demonstrates normal morphology and signal characteristics. Cauda equina is normal. No abnormal cord, leptomeningeal or epidural enhancement. PARASPINAL AND SOFT TISSUES: Included prevertebral and paraspinal soft tissues are normal. DISC LEVELS: T12-L1: No disc bulge, canal stenosis nor neural foraminal narrowing.  L1-2: Small LEFT central broad-based disc protrusion without canal stenosis or neural foraminal narrowing. L2-3: Small RIGHT central disc protrusion contacts the traversing RIGHT L3 within the lateral recess. Mild canal stenosis. No neural foraminal narrowing. L3-4: Small broad-based disc bulge, 4 x 4 mm extrusion along the inferior disc, difficult to quantify due to slice thickness. No canal stenosis or neural foraminal narrowing. L4-5: LEFT central disc extrusion measuring 4 x 9 mm (AP by CC) difficult to quantify due to slice thickness. Surrounding enhancing granulation tissue. Mild canal stenosis with lateral recess effacement likely affecting the traversing L5 nerves though there is moderate thecal sac effacement. Mild to moderate RIGHT, minimal LEFT neural foraminal narrowing. L5-S1: Small central disc protrusion and annular fissure. No canal stenosis. Mild LEFT neural foraminal narrowing. IMPRESSION: Moderate L4-5 disc extrusion results in mild canal stenosis (moderate thecal sac effacement) likely affects the traversing L5 nerves. Small L3-4 disc extrusion. Small disc protrusions all remaining lumbar levels. Mild canal stenosis L2-3. Mild to moderate RIGHT L4-5 neural foraminal narrowing. Acute findings discussed with and reconfirmed by Dr.KRISTEN WARD on 12/20/2016 at 3:54 am. Electronically Signed: By: CElon AlasM.D. On: 12/20/2016 03:55    ROS: As above Blood pressure (!) 173/87, pulse 95, temperature 98.6 F (37 C), temperature source Rectal, resp. rate 16, SpO2 98 %. Physical Exam  General: An obese 41year old Hispanic female in no apparent distress and remarkably stoic.  HEENT: Normocephalic, extraocular muscles intact  Neck: Supple, normal range of motion  Thorax: Symmetric  Abdomen: Soft and obese  Extremities: Unremarkable  Neurologic exam: The patient is alert and oriented 3, Glasgow Coma Scale 15. Cranial nerves II through XII were examined bilaterally and grossly  normal. Vision and hearing are grossly normal bilaterally. The patient's motor strength is 5 over 5 in about a bicep, tricep, hand grip. She has 0/5 bilateral iliopsoas, quadriceps, gastrocnemius, and dorsiflexor strength. There is decreased light touch sensation in her lower extremities. By report she has perineal numbness and decreased rectal tone. A Foley catheter is in place. Cerebellar function is intact to rapid alternating movements of the upper extremities bilaterally.  I have reviewed the patient's lumbar MRI performed with and without contrast [facility]. The patient has disc degeneration at L4-5 with a left-sided herniated disc which results in moderate spinal stenosis. There is mild diffuse bulges here and there.  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 Multiple sclerosis/Devic's disease are also possible. Neurology is managing this.  L4-5 herniated disc, spinal stenosis: This is likely the source of her "left sciatica" and can be addressed in the future if necessary. I don't think this is the cause of her leg weakness as she has weakness proximal to the L4-5 segment.  I have discussed this case with Dr. Leonides Schanz.    JENKINS,JEFFREY D 12/20/2016, 8:50 AM

## 2016-12-20 NOTE — ED Notes (Signed)
Patient transported to MRI 
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
## 2016-12-20 NOTE — Progress Notes (Signed)
Pt arrived to unit from ED via bed. No c/o pain or discomfort. Pt oriented to unit.

## 2016-12-21 ENCOUNTER — Inpatient Hospital Stay (HOSPITAL_COMMUNITY): Payer: PRIVATE HEALTH INSURANCE

## 2016-12-21 ENCOUNTER — Encounter (HOSPITAL_COMMUNITY): Payer: Self-pay

## 2016-12-21 LAB — BASIC METABOLIC PANEL
Anion gap: 7 (ref 5–15)
BUN: 9 mg/dL (ref 6–20)
CO2: 24 mmol/L (ref 22–32)
CREATININE: 0.65 mg/dL (ref 0.44–1.00)
Calcium: 8.8 mg/dL — ABNORMAL LOW (ref 8.9–10.3)
Chloride: 104 mmol/L (ref 101–111)
GFR calc Af Amer: >60 mL/min (ref >60)
GLUCOSE: 204 mg/dL — AB (ref 65–99)
POTASSIUM: 4.4 mmol/L (ref 3.5–5.1)
Sodium: 135 mmol/L (ref 135–145)

## 2016-12-21 LAB — ANTI-JO 1 ANTIBODY, IGG: Anti JO-1: <0.2 AI (ref 0.0–0.9)

## 2016-12-21 LAB — EXTRACTABLE NUCLEAR ANTIGEN ANTIBODY
SSA (RO) (ENA) ANTIBODY, IGG: 0.7 AI (ref 0.0–0.9)
Scleroderma (Scl-70) (ENA) Antibody, IgG: <0.2 AI (ref 0.0–0.9)
ds DNA Ab: <1 IU/mL (ref 0–9)

## 2016-12-21 LAB — PROTIME-INR
INR: 1.04
Prothrombin Time: 13.7 seconds (ref 11.4–15.2)

## 2016-12-21 LAB — HIV ANTIBODY (ROUTINE TESTING W REFLEX): HIV Screen 4th Generation wRfx: NONREACTIVE

## 2016-12-21 LAB — APTT: aPTT: 29 seconds (ref 24–36)

## 2016-12-21 LAB — GLUCOSE, CAPILLARY
GLUCOSE-CAPILLARY: 310 mg/dL — AB (ref 65–99)
Glucose-Capillary: 289 mg/dL — ABNORMAL HIGH (ref 65–99)

## 2016-12-21 MED ORDER — POLYETHYLENE GLYCOL 3350 17 G PO PACK
17.0000 g | PACK | Freq: Every day | ORAL | Status: DC
  Administered 2016-12-21 – 2016-12-22 (×2): 17 g via ORAL
  Filled 2016-12-21 (×2): qty 1

## 2016-12-21 MED ORDER — INSULIN ASPART 100 UNIT/ML ~~LOC~~ SOLN
0.0000 [IU] | Freq: Three times a day (TID) | SUBCUTANEOUS | Status: DC
  Administered 2016-12-21: 7 [IU] via SUBCUTANEOUS
  Administered 2016-12-22: 2 [IU] via SUBCUTANEOUS
  Administered 2016-12-22: 3 [IU] via SUBCUTANEOUS
  Administered 2016-12-22: 2 [IU] via SUBCUTANEOUS
  Administered 2016-12-23: 3 [IU] via SUBCUTANEOUS

## 2016-12-21 MED ORDER — NYSTATIN 100000 UNIT/GM EX POWD
Freq: Three times a day (TID) | CUTANEOUS | Status: DC
  Administered 2016-12-21 – 2016-12-23 (×6): via TOPICAL
  Administered 2016-12-23 (×2): 1 g via TOPICAL
  Administered 2016-12-24 – 2016-12-28 (×9): via TOPICAL
  Administered 2016-12-28: 1 via TOPICAL
  Administered 2016-12-28 – 2016-12-29 (×2): via TOPICAL
  Filled 2016-12-21: qty 15

## 2016-12-21 MED ORDER — INSULIN ASPART 100 UNIT/ML ~~LOC~~ SOLN: 0.0000 [IU] | Freq: Every day | SUBCUTANEOUS | Status: DC

## 2016-12-21 MED ORDER — INSULIN GLARGINE 100 UNIT/ML ~~LOC~~ SOLN
5.0000 [IU] | Freq: Every day | SUBCUTANEOUS | Status: DC
  Administered 2016-12-21 – 2016-12-22 (×2): 5 [IU] via SUBCUTANEOUS
  Filled 2016-12-21 (×3): qty 0.05

## 2016-12-21 NOTE — Progress Notes (Signed)
Going for MRI now.

## 2016-12-21 NOTE — Progress Notes (Signed)
Subjective: Currently, the patient reports that her paresthesia has progressed up her thighs and she has more numbness. Worsened LLE paresthesia compared to yesterday. Still with full paraplegia below hips. Pt is tearful on exam, but understands current workup.  Interval Events: B12 wnl, Autoimmune Ab negative BG elevated in setting of steroids  Objective: Vital signs in last 24 hours: Vitals:   12/20/16 2202 12/21/16 0134 12/21/16 0441 12/21/16 1028  BP: (!) 168/95 (!) 161/88 (!) 160/83 (!) 155/81  Pulse: (!) 111 (!) 107 88 91  Resp: 20 18 20 20   Temp: 99.3 F (37.4 C) 98.8 F (37.1 C) 98.8 F (37.1 C) 98.6 F (37 C)  TempSrc: Oral Oral Oral Oral  SpO2: 95% 94% 96% 97%   Intake/Output:  05/06 0701 - 05/07 0700 In: 6283 [P.O.:2875; I.V.:1725; IV Piggyback:500] Out: 1517 [Urine:3425]    Physical Exam: Physical Exam  Constitutional: She is oriented to person, place, and time. She appears well-developed. She is cooperative. No distress.  HENT:  Head: Normocephalic and atraumatic.  Eyes: EOM are normal. Pupils are equal, round, and reactive to light.  Neck: Normal range of motion.  Cardiovascular: Normal rate, regular rhythm, normal heart sounds and normal pulses.  Exam reveals no gallop.   No murmur heard. Pulmonary/Chest: Effort normal and breath sounds normal. No respiratory distress. Breasts are symmetrical.  Abdominal: Soft. Bowel sounds are normal. There is no tenderness.  Musculoskeletal: She exhibits no edema.  Neurological: She is alert and oriented to person, place, and time.  Full paraplegia below the hips. Progression of paresthesia w/ no sensation to pain, light touch, temp below knee BLE. Minimal temp sensation RLE above the knee.  Skin: Skin is warm and dry.  Psychiatric:  Appropriately anxious and tearful on exam.   Labs: CBC:  Recent Labs Lab 12/20/16 0118  WBC 5.2  NEUTROABS 3.1  HGB 13.4  HCT 41.9  MCV 78.5  PLT 616   Metabolic  Panel:  Recent Labs Lab 12/20/16 0118 12/21/16 0353  NA 135 135  K 4.0 4.4  CL 103 104  CO2 24 24  GLUCOSE 175* 204*  BUN 5* 9  CREATININE 0.60 0.65  CALCIUM 9.1 8.8*    Medications: Infusions: . acyclovir Stopped (12/21/16 0715)  . methylPREDNISolone (SOLU-MEDROL) injection Stopped (12/21/16 0737)   Scheduled Medications: . gabapentin  300 mg Oral TID  . insulin aspart  0-5 Units Subcutaneous QHS  . insulin aspart  0-9 Units Subcutaneous TID WC  . meloxicam  7.5 mg Oral Daily  . nystatin   Topical TID   PRN Medications: oxyCODONE-acetaminophen  Assessment/Plan: Pt is a 41 y.o. 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 Interventions at this time will be focused on diagnostic w/u and high dose steroids.   1) Transverse myelitis: Progression of paresthesia today. Denies ocular symptoms. Band like pain sensation in lumbar region, 5/10. Hyperintensity of thoracic spinal cord on T2. MRI w/ and w/o contrast brain, cervical and thoracic spine pending today. Neurology follow with extensive w/u for possible etiology. Autoimmune labs negative, B12 wnl, Cu/Zn pending, CSF studies pending LP which cannot be done today d/t lovenox injection (plan tomorrow). Neurology c/s, appreciate recs. 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 L4-5 herniation may be responsible. NSG signed off, can f/u outpt if sciatica returns after resolution from myelitis.  3) HTN: initially hypertensive in the setting of  acute pain/fear from neuro deficits. Improving. Will monitor.  DVT PPx - low molecular weight heparin, hold tomorrow for IR LP.  Length of Stay: 1 day(s) Dispo: Anticipated discharge after diagnostic w/u and completion of acute therapy.  Holley Raring, MD Pager: [phone] (7AM-5PM) 12/21/2016, 1:05 PM

## 2016-12-21 NOTE — Care Management Note (Signed)
Case Management Note  Patient Details  Name: Traci [name] MRN: [id number] Date of Birth: 14-Mar-1976  Subjective/Objective:    Pt in with bilateral leg weakness. She is from home with a roommate.                Action/Plan: Plan is to return home when medically stable. CM following for d/c needs, physician orders.    Expected Discharge Date:                  Expected Discharge Plan:  Home/Self Care  In-House Referral:     Discharge planning Services     Post Acute Care Choice:    Choice offered to:     DME Arranged:    DME Agency:     HH Arranged:    HH Agency:     Status of Service:  In process, will continue to follow  If discussed at Long Length of Stay Meetings, dates discussed:    Additional Comments:  Pollie Friar, RN 12/21/2016, 4:23 PM

## 2016-12-21 NOTE — Progress Notes (Signed)
Patient ID: Arelia Volpe, female   DOB: 03/12/1976, 41 y.o.   MRN: [id number] Subjective:  The patient is alert and pleasant. She appears comfortable. She's not having much pain. She still can't move her legs.  Objective: Vital signs in last 24 hours: Temp:  [98.2 F (36.8 C)-99.7 F (37.6 C)] 98.6 F (37 C) (05/07 1028) Pulse Rate:  [88-111] 91 (05/07 1028) Resp:  [18-20] 20 (05/07 1028) BP: (155-175)/(81-95) 155/81 (05/07 1028) SpO2:  [94 %-97 %] 97 % (05/07 1028)  Intake/Output from previous day: 05/06 0701 - 05/07 0700 In: 5100 [P.O.:2875; I.V.:1725; IV Piggyback:500] Out: 9937 [Urine:3425] Intake/Output this shift: Total I/O In: 240 [P.O.:240] Out: -   Physical exam the patient is alert and pleasant. She is paraplegic. She has light touch sensation in her bilateral lower extremities.  Lab Results:  Recent Labs  12/20/16 0118  WBC 5.2  HGB 13.4  HCT 41.9  PLT 232   BMET  Recent Labs  12/20/16 0118 12/21/16 0353  NA 135 135  K 4.0 4.4  CL 103 104  CO2 24 24  GLUCOSE 175* 204*  BUN 5* 9  CREATININE 0.60 0.65  CALCIUM 9.1 8.8*    Studies/Results: Mr Thoracic Spine Wo Contrast  Result Date: 12/20/2016 CLINICAL DATA:  Urinary incontinence, thoracic spine pain. EXAM: MRI THORACIC SPINE WITHOUT CONTRAST TECHNIQUE: Multiplanar, multisequence MR imaging of the thoracic spine was performed. No intravenous contrast was administered. COMPARISON:  None. FINDINGS: Large body habitus results in decreased signal to noise ratio, further degraded by mild motion. ALIGNMENT: Maintenance of the thoracic kyphosis. No malalignment. VERTEBRAE/DISCS: Vertebral bodies are intact. Intervertebral discs morphology and signal are normal. Mild mineralization mid thoracic discs. Multilevel mild chronic discogenic endplate changes with minimal acute component at T7-8. No suspicious bone marrow signal. CORD: Abnormal expansile T2 bright signal within the from T10 to the conus medullaris,  predominately involving the lateral columns. Contrast was administered for prior examination, however there is no T1 shortening within the lesion. Faint bright STIR signal thoracic spine from T for 2 T5-6 PREVERTEBRAL AND PARASPINAL SOFT TISSUES:  Normal. DISC LEVELS: T1-2 through T3-4: No disc bulge, canal stenosis nor neural foraminal narrowing. T4-5, T5-6: Small broad-based disc bulge without canal stenosis. No neural foraminal narrowing. T6-7: Small broad-based LEFT central disc protrusion without canal stenosis. Mild LEFT neural foraminal narrowing. T7-8: Small broad-based disc bulge without canal stenosis or neural foraminal narrowing. T8-9, T9-10, T10-11 and T11-12: No significant disc bulge, canal stenosis or neural foraminal narrowing. IMPRESSION: Discontinuous abnormal signal within the mid thoracic spinal cord to the conus medullaris concerning for demyelination. Recommend postcontrast imaging and, imaging of the remaining neuro access when clinically able and, 24 hours after last gadolinium contrast administration. Degenerative change of the thoracic spine without canal stenosis. Mild LEFT T6-7 neural foraminal narrowing. Acute findings discussed with and reconfirmed by Dr.KRISTEN WARD on 12/20/2016 at 6:40 am. Electronically Signed   By: Elon Alas M.D.   On: 12/20/2016 06:45   Mr Lumbar Spine W Wo Contrast  Addendum Date: 12/20/2016   ADDENDUM REPORT: 12/20/2016 05:54 ADDENDUM: Upon re- review, there is likely moderate canal stenosis at L4-5, AP dimension of the canal is 5 mm. Thecal sac is 3-4 mm. Electronically Signed   By: Elon Alas M.D.   On: 12/20/2016 05:54   Result Date: 12/20/2016 CLINICAL DATA:  Bilateral lower extremity numbness and burning sensation for week, not improved with medication. EXAM: MRI LUMBAR SPINE WITHOUT AND WITH CONTRAST TECHNIQUE: Multiplanar and multiecho  pulse sequences of the lumbar spine were obtained without and with intravenous contrast. CONTRAST:   70mL MULTIHANCE GADOBENATE DIMEGLUMINE 529 MG/ML IV SOLN COMPARISON:  Lumbar spine radiographs October 13, 2016 FINDINGS: SEGMENTATION: For the purposes of this report, the last well-formed intervertebral disc will be described as L5-S1. ALIGNMENT: Maintenance of the lumbar lordosis. No malalignment. VERTEBRAE:Vertebral bodies are intact. Moderate L4-5 and L5-S1 disc height loss with acute on chronic enhancing mild discogenic endplate changes J2-4, mild subacute discogenic endplate changes Q6-S3. No suspicious osseous or disc enhancement. CONUS MEDULLARIS: Conus medullaris terminates at T12-L1 and demonstrates normal morphology and signal characteristics. Cauda equina is normal. No abnormal cord, leptomeningeal or epidural enhancement. PARASPINAL AND SOFT TISSUES: Included prevertebral and paraspinal soft tissues are normal. DISC LEVELS: T12-L1: No disc bulge, canal stenosis nor neural foraminal narrowing. L1-2: Small LEFT central broad-based disc protrusion without canal stenosis or neural foraminal narrowing. L2-3: Small RIGHT central disc protrusion contacts the traversing RIGHT L3 within the lateral recess. Mild canal stenosis. No neural foraminal narrowing. L3-4: Small broad-based disc bulge, 4 x 4 mm extrusion along the inferior disc, difficult to quantify due to slice thickness. No canal stenosis or neural foraminal narrowing. L4-5: LEFT central disc extrusion measuring 4 x 9 mm (AP by CC) difficult to quantify due to slice thickness. Surrounding enhancing granulation tissue. Mild canal stenosis with lateral recess effacement likely affecting the traversing L5 nerves though there is moderate thecal sac effacement. Mild to moderate RIGHT, minimal LEFT neural foraminal narrowing. L5-S1: Small central disc protrusion and annular fissure. No canal stenosis. Mild LEFT neural foraminal narrowing. IMPRESSION: Moderate L4-5 disc extrusion results in mild canal stenosis (moderate thecal sac effacement) likely affects  the traversing L5 nerves. Small L3-4 disc extrusion. Small disc protrusions all remaining lumbar levels. Mild canal stenosis L2-3. Mild to moderate RIGHT L4-5 neural foraminal narrowing. Acute findings discussed with and reconfirmed by Dr.KRISTEN WARD on 12/20/2016 at 3:54 am. Electronically Signed: By: Elon Alas M.D. On: 12/20/2016 03:55    Assessment/Plan: Transverse myelitis versus MS, etc. the neurologist are managing this.  L4-5 herniated disc, moderate spinal stenosis: I think this was the cause of her leg pain she was having a few months ago. This can be addressed in the future if needed if and when she recovers from her spinal cord injury.  I'll sign off. Please call if I can be of further assistance. I'll be glad to see her in the office if her left sciatica should recur after she recovers.  LOS: 1 day     Darlisa Spruiell D 12/21/2016, 11:11 AM

## 2016-12-21 NOTE — Progress Notes (Signed)
Subjective:  Traci [name] is a charming woman that continues to have no movement in her lower legs and now has reported decreased sensation in both her right and left leg, compared to her status yesterday.  She does not report any recent bowel movements but does not feel constipated.  She does not report any shortness of breath.  She does not report any vision changes.  On explanation of her current status, she was tearful but understood and excepted our current plan.     Objective: Vital signs in last 24 hours: Vitals:   12/20/16 2202 12/21/16 0134 12/21/16 0441 12/21/16 1028  BP: (!) 168/95 (!) 161/88 (!) 160/83 (!) 155/81  Pulse: (!) 111 (!) 107 88 91  Resp: 20 18 20 20   Temp: 99.3 F (37.4 C) 98.8 F (37.1 C) 98.8 F (37.1 C) 98.6 F (37 C)  TempSrc: Oral Oral Oral Oral  SpO2: 95% 94% 96% 97%    Intake/Output Summary (Last 24 hours) at 12/21/16 1215 Last data filed at 12/21/16 0900  Gross per 24 hour  Intake             4740 ml  Output             3125 ml  Net             1615 ml    General appearance: cooperative, mild distress and moderately obese Lungs: clear to auscultation bilaterally Heart: regular rate and rhythm, S1, S2 normal, no murmur, click, rub or gallop Neurologic: Mental status: Alert, oriented, thought content appropriate Cranial nerves: normal Sensory: normal, Complete loss of sensation below the ankle, mild sensation above the ankle to the hip on the right side, no sensation on the left leg Motor: 0/5 BLE, normal otherwise  Coordination: normal Lab Results: CMP Latest Ref Rng & Units 12/21/2016 12/20/2016 09/29/2016  Glucose 65 - 99 mg/dL 204(H) 175(H) 128(H)  BUN 6 - 20 mg/dL 9 5(L) 13  Creatinine 0.44 - 1.00 mg/dL 0.65 0.60 0.56(L)  Sodium 135 - 145 mmol/L 135 135 140  Potassium 3.5 - 5.1 mmol/L 4.4 4.0 3.9  Chloride 101 - 111 mmol/L 104 103 100  CO2 22 - 32 mmol/L 24 24 23   Calcium 8.9 - 10.3 mg/dL 8.8(L) 9.1 9.4   CBC Latest Ref Rng & Units  12/20/2016 11/11/2012  WBC 4.0 - 10.5 K/uL 5.2 9.0  Hemoglobin 12.0 - 15.0 g/dL 13.4 11.5(L)  Hematocrit 36.0 - 46.0 % 41.9 37.0  Platelets 150 - 400 K/uL 232 215   Urinalysis    Component Value Date/Time   COLORURINE YELLOW 12/20/2016 1135   APPEARANCEUR CLEAR 12/20/2016 1135   LABSPEC 1.020 12/20/2016 1135   PHURINE 6.0 12/20/2016 1135   GLUCOSEU >=500 (A) 12/20/2016 1135   HGBUR NEGATIVE 12/20/2016 1135   BILIRUBINUR NEGATIVE 12/20/2016 1135   KETONESUR NEGATIVE 12/20/2016 1135   PROTEINUR NEGATIVE 12/20/2016 1135   NITRITE NEGATIVE 12/20/2016 1135   LEUKOCYTESUR NEGATIVE 12/20/2016 1135    Vitamin B12: 1,561 (normal range: 180-914)  Anti-Jo: <0.2 (in normal reference range)  Extractable Nuclear antigen ab: Ribonucleic Protein 0.0 - 0.9 AI <0.2VC   SSA (Ro) (ENA) Antibody, IgG 0.0 - 0.9 AI 0.7   Scleroderma (Scl-70) (ENA) Antibody, IgG 0.0 - 0.9 AI <0.2   ENA SM Ab Ser-aCnc 0.0 - 0.9 AI <0.2   SSB (La) (ENA) Antibody, IgG 0.0 - 0.9 AI <0.2   ds DNA Ab 0 - 9 IU/mL <1   Comments: (NOTE)  Negative   <5                   Equivocal 5 - 9                   Positive   >9      Micro Results: No results found for this or any previous visit (from the past 240 hour(s)). Studies/Results: Mr Thoracic Spine Wo Contrast  Result Date: 12/20/2016 CLINICAL DATA:  Urinary incontinence, thoracic spine pain. EXAM: MRI THORACIC SPINE WITHOUT CONTRAST TECHNIQUE: Multiplanar, multisequence MR imaging of the thoracic spine was performed. No intravenous contrast was administered. COMPARISON:  None. FINDINGS: Large body habitus results in decreased signal to noise ratio, further degraded by mild motion. ALIGNMENT: Maintenance of the thoracic kyphosis. No malalignment. VERTEBRAE/DISCS: Vertebral bodies are intact. Intervertebral discs morphology and signal are normal. Mild mineralization mid thoracic discs. Multilevel mild  chronic discogenic endplate changes with minimal acute component at T7-8. No suspicious bone marrow signal. CORD: Abnormal expansile T2 bright signal within the from T10 to the conus medullaris, predominately involving the lateral columns. Contrast was administered for prior examination, however there is no T1 shortening within the lesion. Faint bright STIR signal thoracic spine from T for 2 T5-6 PREVERTEBRAL AND PARASPINAL SOFT TISSUES:  Normal. DISC LEVELS: T1-2 through T3-4: No disc bulge, canal stenosis nor neural foraminal narrowing. T4-5, T5-6: Small broad-based disc bulge without canal stenosis. No neural foraminal narrowing. T6-7: Small broad-based LEFT central disc protrusion without canal stenosis. Mild LEFT neural foraminal narrowing. T7-8: Small broad-based disc bulge without canal stenosis or neural foraminal narrowing. T8-9, T9-10, T10-11 and T11-12: No significant disc bulge, canal stenosis or neural foraminal narrowing. IMPRESSION: Discontinuous abnormal signal within the mid thoracic spinal cord to the conus medullaris concerning for demyelination. Recommend postcontrast imaging and, imaging of the remaining neuro access when clinically able and, 24 hours after last gadolinium contrast administration. Degenerative change of the thoracic spine without canal stenosis. Mild LEFT T6-7 neural foraminal narrowing. Acute findings discussed with and reconfirmed by Dr.KRISTEN WARD on 12/20/2016 at 6:40 am. Electronically Signed   By: Elon Alas M.D.   On: 12/20/2016 06:45   Mr Lumbar Spine W Wo Contrast  Addendum Date: 12/20/2016   ADDENDUM REPORT: 12/20/2016 05:54 ADDENDUM: Upon re- review, there is likely moderate canal stenosis at L4-5, AP dimension of the canal is 5 mm. Thecal sac is 3-4 mm. Electronically Signed   By: Elon Alas M.D.   On: 12/20/2016 05:54   Result Date: 12/20/2016 CLINICAL DATA:  Bilateral lower extremity numbness and burning sensation for week, not improved with  medication. EXAM: MRI LUMBAR SPINE WITHOUT AND WITH CONTRAST TECHNIQUE: Multiplanar and multiecho pulse sequences of the lumbar spine were obtained without and with intravenous contrast. CONTRAST:  1mL MULTIHANCE GADOBENATE DIMEGLUMINE 529 MG/ML IV SOLN COMPARISON:  Lumbar spine radiographs October 13, 2016 FINDINGS: SEGMENTATION: For the purposes of this report, the last well-formed intervertebral disc will be described as L5-S1. ALIGNMENT: Maintenance of the lumbar lordosis. No malalignment. VERTEBRAE:Vertebral bodies are intact. Moderate L4-5 and L5-S1 disc height loss with acute on chronic enhancing mild discogenic endplate changes M7-6, mild subacute discogenic endplate changes H2-C9. No suspicious osseous or disc enhancement. CONUS MEDULLARIS: Conus medullaris terminates at T12-L1 and demonstrates normal morphology and signal characteristics. Cauda equina is normal. No abnormal cord, leptomeningeal or epidural enhancement. PARASPINAL AND SOFT TISSUES: Included prevertebral and paraspinal soft tissues are normal. DISC LEVELS: T12-L1: No disc bulge, canal stenosis nor  neural foraminal narrowing. L1-2: Small LEFT central broad-based disc protrusion without canal stenosis or neural foraminal narrowing. L2-3: Small RIGHT central disc protrusion contacts the traversing RIGHT L3 within the lateral recess. Mild canal stenosis. No neural foraminal narrowing. L3-4: Small broad-based disc bulge, 4 x 4 mm extrusion along the inferior disc, difficult to quantify due to slice thickness. No canal stenosis or neural foraminal narrowing. L4-5: LEFT central disc extrusion measuring 4 x 9 mm (AP by CC) difficult to quantify due to slice thickness. Surrounding enhancing granulation tissue. Mild canal stenosis with lateral recess effacement likely affecting the traversing L5 nerves though there is moderate thecal sac effacement. Mild to moderate RIGHT, minimal LEFT neural foraminal narrowing. L5-S1: Small central disc protrusion  and annular fissure. No canal stenosis. Mild LEFT neural foraminal narrowing. IMPRESSION: Moderate L4-5 disc extrusion results in mild canal stenosis (moderate thecal sac effacement) likely affects the traversing L5 nerves. Small L3-4 disc extrusion. Small disc protrusions all remaining lumbar levels. Mild canal stenosis L2-3. Mild to moderate RIGHT L4-5 neural foraminal narrowing. Acute findings discussed with and reconfirmed by Dr.KRISTEN WARD on 12/20/2016 at 3:54 am. Electronically Signed: By: Elon Alas M.D. On: 12/20/2016 03:55   Medications: I have reviewed the patient's current medications. Scheduled Meds: . enoxaparin (LOVENOX) injection  40 mg Subcutaneous Q24H  . gabapentin  300 mg Oral TID  . meloxicam  7.5 mg Oral Daily   Continuous Infusions: . acyclovir Stopped (12/21/16 0715)  . methylPREDNISolone (SOLU-MEDROL) injection Stopped (12/21/16 0838)   PRN Meds:.oxyCODONE-acetaminophen Assessment/Plan: Active Problems:   Leg weakness, bilateral   Bilateral leg weakness  Traci [name] is a 41 yo woman with a PMHx significant for degenerative disc disease complains of worsening leg weakness and loss of sensation that is likely associated with an acute demyelinating process, likely either viral myelitis or an initial flair up of MS.    Leg Weakness/Numbness bilaterally: The patients Hx of disc degeneration and back pain, as well as her lumbar MRI are concerning for neuropathy/radiculopathy but the disc extrusions seen on the lumbar MRI would not explain her leg numbness/weakness above the knee or the autonomic dysfunction.  The Thoracic MRI is concerning for demyelinating process such as acute transverse myelitis, MS or Devic's disease. The recent cold makes transverse myelitis 2/2 a viral infection a strong possibility, though her symptoms could be a first manifestation of MS.  There are no signs of visual disturbance, putting Devic's disease lower on our differential.  Her FMHx of  B12 deficiency is concerning for subacute combined degeneration but she has elevated levels of B12.  Cu deficiency can present like B12 deficiency so that should also be ruled out.  Neurology noted that there is also a risk that this could be associated with an autoimmune disease, but all antibody tests came back negative.  Plan: -The patient is on IV acyclovir to combat myelitis if it has a viral etiology.  - LP tomorrow, looking for cell count, protein glucose etc as well as PCR panel for  possible viral etiolgy  (VZV, HSV). -  IV methylprednisone  -Cervical and brain MRI are being ordered to assess for other areas of demyelination that could indicate MS.  -Cu levels to rule out deficiency which can cause muscle weakness.  Get Zn levels too as excess can sequester Cu   - Foley is maintained while the patient has urinary incontinence - consult PT/OT for rehab/ work up  Back pain and burning sensation in thighs: The patient has a  hx of back pain and burning sensation in her thighs that is thought to be sciatica.  At the moment this does not seem to be an issue for her but should be watched.   Plan: Gabapentin was restarted and oxy was given for the pain  Constipation: Patient has not had a BM in the hospital yet, which may be due to her underlying condition, stress, a side effect of her pain medication, or all of the above.   Plan:  Prescribe docusate to stimulate bowel movement.   Hyperglycemia: The patient now has a BG level of 204, which is likely related to her steroid therpay.  However, she does have a significant family history of DM.   Plan: Get an A1C to rule out DM or to establish baseline A1C level in the future.  Administer temporary insulin to control hypergycemia.   This is a Careers information officer Note.  The care of the patient was discussed with Dr. Lynnae January and the assessment and plan formulated with their assistance.  Please see their attached note for official documentation of the  daily encounter.   LOS: 1 day   [name], Medical Student 12/21/2016, 12:15 PM

## 2016-12-21 NOTE — Progress Notes (Signed)
  Date: 12/21/2016  Patient name: Traci [name]  Medical record number: [id number]  Date of birth: 29-Sep-1975   I have seen and evaluated Traci [name] and discussed their care with the Residency Team. Traci [name] is a 41 year old female with a chief complaint of bilateral lower extremity weakness. Her chronic back pain has been worsening for about a week and she was recently seen at an urgent care center where she was prescribed cyclobenzaprine, meloxicam, and gabapentin. Prior to admission, she felt wobbly when taking a shower and went downstairs. At that point her bilateral legs feeling to feel numb and weak from the knees down with the left leg being more affected than the right leg. She had burning sensation proximal to the knees and back pain which was not relieved with cyclobenzaprine or meloxicam.  In the ED, her MRI of the lumbar spine showed moderate canal stenosis at L4-5 that this is not felt to be the cause of her symptoms. A thoracic MRI was then done which showed demyelination of the midthoracic spine.  PMHx, Fam Hx, and/or Soc Hx : Past medical history is significant for DDD. Family history is significant for a B12 deficiency in her brother and father. There is also diabetes in her family. 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  Vitals:   12/21/16 0441 12/21/16 1028  BP: (!) 160/83 (!) 155/81  Pulse: 88 91  Resp: 20 20  Temp: 98.8 F (37.1 C) 98.6 F (37 C)  Gen NAD, obese HRRR no MRG LCTAB Neuro per Traci [name] sensation LLE thigh to knee, absent : knee to foot. 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  Assessment and Plan: I have seen and evaluated the patient as outlined above. I agree with the formulated Assessment  and Plan as detailed in the residents' note, with the following changes:   1. Transverse myelitis - work being done to distinguish between idiopathic transverse myelitis and transverse myelitis from other causes like multiple sclerosis. In the meantime, she is on Solu-Medrol and has a Foley catheter placed. She is also on empiric acyclovir. LP is planned for tomorrow  Bartholomew Crews, MD 5/7/20182:10 PM

## 2016-12-21 NOTE — Progress Notes (Signed)
Inpatient Diabetes Program Recommendations  AACE/ADA: New Consensus Statement on Inpatient Glycemic Control (2015)  Target Ranges:  Prepandial:   less than 140 mg/dL      Peak postprandial:   less than 180 mg/dL (1-2 hours)      Critically ill patients:  140 - 180 mg/dL   No results found for: GLUCAP, HGBA1C  Review of Glycemic Control  Diabetes history: No prior hx Inpatient Diabetes Program Recommendations:  Noted pending A1c.  While on steroids, Please consider: -Novolog correction 0-9 units tid + 0-5 units hs Will follow.  Thank you, Nani Gasser. Keyaria Lawson, RN, MSN, CDE  Diabetes Coordinator Inpatient Glycemic Control Team Team Pager [phone] (8am-5pm) 12/21/2016 9:26 AM

## 2016-12-21 NOTE — Progress Notes (Signed)
Pt's foley leaking; MD ordered larger 18 Fr. Catheter to be inserted. Procedure tolerated well by pt. Will continue to monitor.

## 2016-12-22 ENCOUNTER — Inpatient Hospital Stay (HOSPITAL_COMMUNITY): Payer: PRIVATE HEALTH INSURANCE

## 2016-12-22 LAB — CSF CELL COUNT WITH DIFFERENTIAL
Eosinophils, CSF: 0 % (ref 0–1)
Lymphs, CSF: 3 % — ABNORMAL LOW (ref 40–80)
MONOCYTE-MACROPHAGE-SPINAL FLUID: 0 % — AB (ref 15–45)
Other Cells, CSF: 0
RBC Count, CSF: 3550 /mm3 — ABNORMAL HIGH
SEGMENTED NEUTROPHILS-CSF: 97 % — AB (ref 0–6)
TUBE #: 1
WBC, CSF: 106 /mm3 (ref 0–5)

## 2016-12-22 LAB — GLUCOSE, CAPILLARY
GLUCOSE-CAPILLARY: 168 mg/dL — AB (ref 65–99)
Glucose-Capillary: 192 mg/dL — ABNORMAL HIGH (ref 65–99)
Glucose-Capillary: 234 mg/dL — ABNORMAL HIGH (ref 65–99)
Glucose-Capillary: 367 mg/dL — ABNORMAL HIGH (ref 65–99)

## 2016-12-22 LAB — GLUCOSE, CSF: Glucose, CSF: 92 mg/dL — ABNORMAL HIGH (ref 40–70)

## 2016-12-22 LAB — HEMOGLOBIN A1C
HEMOGLOBIN A1C: 6.9 % — AB (ref 4.8–5.6)
Mean Plasma Glucose: 151 mg/dL

## 2016-12-22 LAB — VARICELLA-ZOSTER BY PCR: VARICELLA-ZOSTER, PCR: NEGATIVE

## 2016-12-22 LAB — PROTEIN, CSF: Total  Protein, CSF: 36 mg/dL (ref 15–45)

## 2016-12-22 LAB — HIV-1 RNA, QUALITATIVE, TMA: HIV-1 RNA, Qualitative, TMA: NEGATIVE

## 2016-12-22 MED ORDER — POLYETHYLENE GLYCOL 3350 17 G PO PACK
17.0000 g | PACK | Freq: Two times a day (BID) | ORAL | Status: DC
  Administered 2016-12-22 – 2016-12-29 (×4): 17 g via ORAL
  Filled 2016-12-22 (×8): qty 1

## 2016-12-22 MED ORDER — GADOBENATE DIMEGLUMINE 529 MG/ML IV SOLN
20.0000 mL | Freq: Once | INTRAVENOUS | Status: AC | PRN
  Administered 2016-12-22: 20 mL via INTRAVENOUS

## 2016-12-22 MED ORDER — LIDOCAINE HCL (PF) 1 % IJ SOLN
5.0000 mL | Freq: Once | INTRAMUSCULAR | Status: AC
  Administered 2016-12-22: 5 mL via INTRADERMAL

## 2016-12-22 MED ORDER — LIDOCAINE HCL 1 % IJ SOLN
INTRAMUSCULAR | Status: AC
  Filled 2016-12-22: qty 10

## 2016-12-22 NOTE — Progress Notes (Signed)
Pt has an elevation of WBC of CSF. MD paged.  Ave Filter, RN

## 2016-12-22 NOTE — Progress Notes (Signed)
Subjective: Interval History: minor improvement in the LE sensations   Objective: Vital signs in last 24 hours: Temp:  [97.8 F (36.6 C)-99 F (37.2 C)] 98.8 F (37.1 C) (05/08 0945) Pulse Rate:  [86-111] 86 (05/08 0437) Resp:  [18-20] 18 (05/08 0945) BP: (138-164)/(63-87) 152/87 (05/08 0945) SpO2:  [95 %-99 %] 97 % (05/08 0945)  Intake/Output from previous day: 05/07 0701 - 05/08 0700 In: 600 [P.O.:600] Out: 2350 [Urine:2350] Intake/Output this shift: No intake/output data recorded. Nutritional status: Diet regular Room service appropriate? Yes; Fluid consistency: Thin   Neurological Examination Mental Status: Alert, oriented, thought content appropriate.  Speech fluent without evidence of aphasia.  Able to follow 3 step commands without difficulty. Cranial Nerves: II: Visual fields intact, pupils equal, round, reactive to light  III,IV, VI: ptosis not present, extra-ocular movements intact bilaterally V,VII: smile symmetric, patchy decreased facial temp sensation VIII: hearing intact to voice IX,X: no hypophonia XI: Symmetric XII: midline tongue extension Motor: RUE and LUE 5/5 proximally and distally with normal tone.  RLE: Trace toe movement, otherwise 0/5 hip flexor, knee extensor/flexor and ankle dorsi/plantar flexion. LLE: 0/5 hip flexor, knee extensor/flexor, ankle dorsi/plantar flexion and toes.  Tone flaccid bilateral lower extremities.  Sensory: Decreased temperature sensation bilateral lower extremities.  Severely impaired proprioception to toes and ankles bilaterally. Decreased fine touch bilateral lower extremities, left worse than right.  Sensory level is at L1 bilaterally, with levels more rostrally exhibiting normal sensation on exam.  Deep Tendon Reflexes: 2+ bilateral brachioradialis and biceps. 0 patellae and achilles bilaterally. Toes mute bilaterally.  Cerebellar: Normal FNF bilaterally  Gait: Unable to assess  Lab Results:  Recent Labs   12/20/16 0118 12/21/16 0353  WBC 5.2  --   HGB 13.4  --   HCT 41.9  --   PLT 232  --   NA 135 135  K 4.0 4.4  CL 103 104  CO2 24 24  GLUCOSE 175* 204*  BUN 5* 9  CREATININE 0.60 0.65  CALCIUM 9.1 8.8*   Lipid Panel No results for input(s): CHOL, TRIG, HDL, CHOLHDL, VLDL, LDLCALC in the last 72 hours.  Studies/Results: Mr Jeri Cos And Wo Contrast  Result Date: 12/22/2016 CLINICAL DATA:  42 y/o F; bilateral lower extremity weakness chronic back pain. Possible demyelinating disorder. EXAM: MRI HEAD WITHOUT AND WITH CONTRAST TECHNIQUE: Multiplanar, multiecho pulse sequences of the brain and surrounding structures were obtained without and with intravenous contrast. CONTRAST:  90mL MULTIHANCE GADOBENATE DIMEGLUMINE 529 MG/ML IV SOLN COMPARISON:  None. FINDINGS: Brain: No acute infarction, hemorrhage, hydrocephalus, extra-axial collection or mass lesion. No significant T2 FLAIR signal abnormality of the brain. No white matter lesion identified. No abnormal enhancement. Vascular: Normal flow voids. Skull and upper cervical spine: Normal marrow signal. Sinuses/Orbits: Mild ethmoid sinus and sphenoid sinus mucosal thickening. No abnormal signal of mastoid air cells. Orbits are unremarkable. Other: None. IMPRESSION: No white matter lesion identified. No abnormal enhancement. Unremarkable MRI of brain. Please refer to findings of MRI of the cervical and thoracic spine. Electronically Signed   By: Kristine Garbe M.D.   On: 12/22/2016 01:18   Mr Thoracic Spine W Contrast  Result Date: 12/22/2016 CLINICAL DATA:  41 y/o  F; bilateral lower extremity weakness. EXAM: MRI CERVICAL WITHOUT AND WITH CONTRAST MRI THORACIC WITHOUT AND WITH CONTRAST TECHNIQUE: Multiplanar and multiecho pulse sequences of the cervical spine, to include the craniocervical junction and cervicothoracic junction, and thoracic spine, were obtained without and with intravenous contrast. CONTRAST:  46mL MULTIHANCE GADOBENATE  DIMEGLUMINE 529 MG/ML IV SOLN COMPARISON:  None. FINDINGS: MRI CERVICAL SPINE FINDINGS Alignment: Physiologic. Vertebrae: No fracture, evidence of discitis, or bone lesion. No abnormal enhancement. Cord: Normal signal and morphology.  No abnormal enhancement. Posterior Fossa, vertebral arteries, paraspinal tissues: Prominent left level 3 lymph node measuring 13 x 9 mm (series 17, image 20), probably reactive. Disc levels: C2-3: No significant disc displacement, foraminal narrowing, or canal stenosis. C3-4: Mild left-sided uncovertebral and facet hypertrophy with mild left foraminal narrowing. No significant canal stenosis. C4-5: No significant disc displacement, foraminal narrowing, or canal stenosis. C5-6: Small disc osteophyte complex with mild bilateral uncovertebral and facet hypertrophy. Mild bilateral foraminal narrowing. No significant canal stenosis. C6-7: Small central protrusion. No significant foraminal narrowing or canal stenosis. C7-T1: No significant disc displacement, foraminal narrowing, or canal stenosis. MRI THORACIC SPINE FINDINGS T1 pre and postcontrast sequences were acquired. Alignment:  Physiologic. Vertebrae:  No abnormal enhancement. Cord:  No abnormal enhancement. Paraspinal and other soft tissues: Negative. IMPRESSION: 1. No abnormal enhancement of the spinal cord. Normal cervical cord signal. Multiple sclerosis is unlikely in the absence of brain lesions. 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 Additionally, prominent T2 signal within anterior gray matter as seen in the conus on the prior thoracic MRI can be seen with cord infarct, although unlikely. No enhancement to suggest AIDP. 2. Mild cervical spondylosis greatest at the C5-6 level where predominantly discogenic disease results in mild bilateral foraminal narrowing. No significant canal stenosis. Electronically Signed   By: Kristine Garbe M.D.   On:  12/22/2016 01:51   Mr Cervical Spine W Or Wo Contrast  Result Date: 12/22/2016 CLINICAL DATA:  41 y/o  F; bilateral lower extremity weakness. EXAM: MRI CERVICAL WITHOUT AND WITH CONTRAST MRI THORACIC WITHOUT AND WITH CONTRAST TECHNIQUE: Multiplanar and multiecho pulse sequences of the cervical spine, to include the craniocervical junction and cervicothoracic junction, and thoracic spine, were obtained without and with intravenous contrast. CONTRAST:  87mL MULTIHANCE GADOBENATE DIMEGLUMINE 529 MG/ML IV SOLN COMPARISON:  None. FINDINGS: MRI CERVICAL SPINE FINDINGS Alignment: Physiologic. Vertebrae: No fracture, evidence of discitis, or bone lesion. No abnormal enhancement. Cord: Normal signal and morphology.  No abnormal enhancement. Posterior Fossa, vertebral arteries, paraspinal tissues: Prominent left level 3 lymph node measuring 13 x 9 mm (series 17, image 20), probably reactive. Disc levels: C2-3: No significant disc displacement, foraminal narrowing, or canal stenosis. C3-4: Mild left-sided uncovertebral and facet hypertrophy with mild left foraminal narrowing. No significant canal stenosis. C4-5: No significant disc displacement, foraminal narrowing, or canal stenosis. C5-6: Small disc osteophyte complex with mild bilateral uncovertebral and facet hypertrophy. Mild bilateral foraminal narrowing. No significant canal stenosis. C6-7: Small central protrusion. No significant foraminal narrowing or canal stenosis. C7-T1: No significant disc displacement, foraminal narrowing, or canal stenosis. MRI THORACIC SPINE FINDINGS T1 pre and postcontrast sequences were acquired. Alignment:  Physiologic. Vertebrae:  No abnormal enhancement. Cord:  No abnormal enhancement. Paraspinal and other soft tissues: Negative. IMPRESSION: 1. No abnormal enhancement of the spinal cord. Normal cervical cord signal. Multiple sclerosis is unlikely in the absence of brain lesions. 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 Additionally, prominent T2 signal within anterior gray matter as seen in the conus on the prior thoracic MRI can be seen with cord infarct, although unlikely. No enhancement to suggest AIDP. 2. Mild cervical spondylosis greatest at the C5-6 level where predominantly discogenic disease results in mild bilateral foraminal narrowing. No significant  canal stenosis. Electronically Signed   By: Kristine Garbe M.D.   On: 12/22/2016 01:51    Medications: I have reviewed the patient's current medications.  Assessment/Plan: Pt is a 41 y.o. 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 Interventions at this time will be focused on diagnostic w/u and high dose steroids.   1) Transverse myelitis:  MRI Brain, C/T/L spine showed no new lesions, no contrast enhancements  thoracic lesions C/O  transverse myelitis.  -LP under flor today for CSF studies  - Solu-Medrol 1g qD x 5d -will consider immunotherapy if there is no improvements on steroids  - empiric therapy with acyclovir IV is not needed -PT/OT   LOS: 2 days   Arnaldo Natal, MD [facility]

## 2016-12-22 NOTE — Progress Notes (Signed)
   Subjective: Currently, the patient is in better spirits this AM due to having small improvement in LE sensation R>L. She still cannot move LE below hips. Understands need for LP today. Pain controlled. No SOB or difficulty breathing.  Interval Events: MRI w/o new findings.  Objective: Vital signs in last 24 hours: Vitals:   12/21/16 1744 12/21/16 2104 12/22/16 0151 12/22/16 0437  BP: (!) 164/87 (!) 155/80 138/63 (!) 163/79  Pulse: (!) 111 (!) 105 (!) 102 86  Resp: 18 20 20 18   Temp: 98.8 F (37.1 C) 99 F (37.2 C) 98.5 F (36.9 C) 97.8 F (36.6 C)  TempSrc: Oral Oral Oral Oral  SpO2: 96% 95% 99% 95%   Intake/Output:  05/07 0701 - 05/08 0700 In: 600 [P.O.:600] Out: 2350 [Urine:2350]    Physical Exam: Physical Exam  Constitutional: She is oriented to person, place, and time. She appears well-developed. She is cooperative. No distress.  HENT:  Head: Normocephalic and atraumatic.  Eyes: EOM are normal. Pupils are equal, round, and reactive to light.  Neck: Normal range of motion.  Cardiovascular: Normal rate, regular rhythm, intact distal pulses and normal pulses.   Pulmonary/Chest: Effort normal. No respiratory distress. Breasts are symmetrical.  Abdominal: Soft. Bowel sounds are normal. There is no tenderness.  Musculoskeletal: She exhibits no edema.  Neurological: She is alert and oriented to person, place, and time.  Full paraplegia below the hips. Improvement in sensation bilat above the knee to temp, light touch, pain. Small improvement below the knee bilat R>L to temp, light touch, no pain sesation below knee.  Skin: Skin is warm and dry.   Medications: Infusions: . acyclovir Stopped (12/22/16 0641)  . methylPREDNISolone (SOLU-MEDROL) injection Stopped (12/21/16 8850)   Scheduled Medications: . gabapentin  300 mg Oral TID  . insulin aspart  0-9 Units Subcutaneous TID WC  . insulin glargine  5 Units Subcutaneous QHS  . meloxicam  7.5 mg Oral Daily  . nystatin    Topical TID  . polyethylene glycol  17 g Oral Daily   PRN Medications: oxyCODONE-acetaminophen  Assessment/Plan: Pt is a 41 y.o. 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 Interventions at this time will be focused on diagnostic w/u and high dose steroids.   1) Transverse myelitis: completion of MRI series yesterday w/o brain finding suggestive of MS, no cervical lesions, thoracic findings still consistent with idiopathic vs infectious transverse myelitis. Autoimmune less likely with negative serum studies. Neurology following, awaiting CSF studies. - Solu-Medrol 1g qD x 5d - maintain foley for neurogenic bladder - PT/OT - q4h neuro-check - f/u CSF - empiric therapy with acyclovir IV - pain control w/ oxy-APAP 5-325mg  + gabapentin 300mg  TID  Length of Stay: 2 day(s) Dispo: Anticipated discharge after diagnostic w/u and completion of acute therapy.  Holley Raring, MD Pager: [phone] (7AM-5PM) 12/22/2016, 7:16 AM

## 2016-12-22 NOTE — Progress Notes (Addendum)
Patient returned from LP. VSS. Pt aware to lie flat for 4 hours.  Lab aware to draw a red and gold top at this time.   Ave Filter, RN

## 2016-12-22 NOTE — Progress Notes (Signed)
Subjective:  Traci [name] is a charming woman that continues to have no movement in her lower legs.  Since yesterday, she notes that there is new sensation in both of her legs. Shew received mirilax yesterday but still does not report any bowel movements, even though she can feel her GI tract moving.  She does not report any shortness of breath.  She does not report any vision changes.  We discussed the role of the LP and details of the procedure.     Objective: Vital signs in last 24 hours: Vitals:   12/21/16 2104 12/22/16 0151 12/22/16 0437 12/22/16 0945  BP: (!) 155/80 138/63 (!) 163/79 (!) 152/87  Pulse: (!) 105 (!) 102 86   Resp: 20 20 18 18   Temp: 99 F (37.2 C) 98.5 F (36.9 C) 97.8 F (36.6 C) 98.8 F (37.1 C)  TempSrc: Oral Oral Oral Oral  SpO2: 95% 99% 95% 97%    Intake/Output Summary (Last 24 hours) at 12/22/16 1138 Last data filed at 12/22/16 0151  Gross per 24 hour  Intake              360 ml  Output             2350 ml  Net            -1990 ml    General appearance: cooperative, no distress and moderately obese Lungs: clear to auscultation bilaterally Heart: regular rate and rhythm, S1, S2 normal, no murmur, click, rub or gallop Neurologic: Mental status: Alert, oriented, thought content appropriate Cranial nerves: normal Sensory: Has renewed sensation in both her legs with light touch.  Suprapopliteal area is most numb in both legs.  Motor: 0/5 BLE, normal otherwise   Lab Results:  Capillary Glucose- 168 (5/8), 289 (5/7) A1C-6.9%  APTT- 29 (normal) PT-13.7 (normal)   Studies/Results: Mr Jeri Cos And Wo Contrast  Result Date: 12/22/2016 CLINICAL DATA:  41 y/o F; bilateral lower extremity weakness chronic back pain. Possible demyelinating disorder. EXAM: MRI HEAD WITHOUT AND WITH CONTRAST TECHNIQUE: Multiplanar, multiecho pulse sequences of the brain and surrounding structures were obtained without and with intravenous contrast. CONTRAST:  36mL MULTIHANCE  GADOBENATE DIMEGLUMINE 529 MG/ML IV SOLN COMPARISON:  None. FINDINGS: Brain: No acute infarction, hemorrhage, hydrocephalus, extra-axial collection or mass lesion. No significant T2 FLAIR signal abnormality of the brain. No white matter lesion identified. No abnormal enhancement. Vascular: Normal flow voids. Skull and upper cervical spine: Normal marrow signal. Sinuses/Orbits: Mild ethmoid sinus and sphenoid sinus mucosal thickening. No abnormal signal of mastoid air cells. Orbits are unremarkable. Other: None. IMPRESSION: No white matter lesion identified. No abnormal enhancement. Unremarkable MRI of brain. Please refer to findings of MRI of the cervical and thoracic spine. Electronically Signed   By: Kristine Garbe M.D.   On: 12/22/2016 01:18   Mr Thoracic Spine W Contrast  Result Date: 12/22/2016 CLINICAL DATA:  41 y/o  F; bilateral lower extremity weakness. EXAM: MRI CERVICAL WITHOUT AND WITH CONTRAST MRI THORACIC WITHOUT AND WITH CONTRAST TECHNIQUE: Multiplanar and multiecho pulse sequences of the cervical spine, to include the craniocervical junction and cervicothoracic junction, and thoracic spine, were obtained without and with intravenous contrast. CONTRAST:  73mL MULTIHANCE GADOBENATE DIMEGLUMINE 529 MG/ML IV SOLN COMPARISON:  None. FINDINGS: MRI CERVICAL SPINE FINDINGS Alignment: Physiologic. Vertebrae: No fracture, evidence of discitis, or bone lesion. No abnormal enhancement. Cord: Normal signal and morphology.  No abnormal enhancement. Posterior Fossa, vertebral arteries, paraspinal tissues: Prominent left level 3 lymph node measuring  13 x 9 mm (series 17, image 20), probably reactive. Disc levels: C2-3: No significant disc displacement, foraminal narrowing, or canal stenosis. C3-4: Mild left-sided uncovertebral and facet hypertrophy with mild left foraminal narrowing. No significant canal stenosis. C4-5: No significant disc displacement, foraminal narrowing, or canal stenosis. C5-6: Small  disc osteophyte complex with mild bilateral uncovertebral and facet hypertrophy. Mild bilateral foraminal narrowing. No significant canal stenosis. C6-7: Small central protrusion. No significant foraminal narrowing or canal stenosis. C7-T1: No significant disc displacement, foraminal narrowing, or canal stenosis. MRI THORACIC SPINE FINDINGS T1 pre and postcontrast sequences were acquired. Alignment:  Physiologic. Vertebrae:  No abnormal enhancement. Cord:  No abnormal enhancement. Paraspinal and other soft tissues: Negative. IMPRESSION: 1. No abnormal enhancement of the spinal cord. Normal cervical cord signal. Multiple sclerosis is unlikely in the absence of brain lesions. 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 Additionally, prominent T2 signal within anterior gray matter as seen in the conus on the prior thoracic MRI can be seen with cord infarct, although unlikely. No enhancement to suggest AIDP. 2. Mild cervical spondylosis greatest at the C5-6 level where predominantly discogenic disease results in mild bilateral foraminal narrowing. No significant canal stenosis. Electronically Signed   By: Kristine Garbe M.D.   On: 12/22/2016 01:51   Mr Cervical Spine W Or Wo Contrast  Result Date: 12/22/2016 CLINICAL DATA:  41 y/o  F; bilateral lower extremity weakness. EXAM: MRI CERVICAL WITHOUT AND WITH CONTRAST MRI THORACIC WITHOUT AND WITH CONTRAST TECHNIQUE: Multiplanar and multiecho pulse sequences of the cervical spine, to include the craniocervical junction and cervicothoracic junction, and thoracic spine, were obtained without and with intravenous contrast. CONTRAST:  32mL MULTIHANCE GADOBENATE DIMEGLUMINE 529 MG/ML IV SOLN COMPARISON:  None. FINDINGS: MRI CERVICAL SPINE FINDINGS Alignment: Physiologic. Vertebrae: No fracture, evidence of discitis, or bone lesion. No abnormal enhancement. Cord: Normal signal and morphology.  No  abnormal enhancement. Posterior Fossa, vertebral arteries, paraspinal tissues: Prominent left level 3 lymph node measuring 13 x 9 mm (series 17, image 20), probably reactive. Disc levels: C2-3: No significant disc displacement, foraminal narrowing, or canal stenosis. C3-4: Mild left-sided uncovertebral and facet hypertrophy with mild left foraminal narrowing. No significant canal stenosis. C4-5: No significant disc displacement, foraminal narrowing, or canal stenosis. C5-6: Small disc osteophyte complex with mild bilateral uncovertebral and facet hypertrophy. Mild bilateral foraminal narrowing. No significant canal stenosis. C6-7: Small central protrusion. No significant foraminal narrowing or canal stenosis. C7-T1: No significant disc displacement, foraminal narrowing, or canal stenosis. MRI THORACIC SPINE FINDINGS T1 pre and postcontrast sequences were acquired. Alignment:  Physiologic. Vertebrae:  No abnormal enhancement. Cord:  No abnormal enhancement. Paraspinal and other soft tissues: Negative. IMPRESSION: 1. No abnormal enhancement of the spinal cord. Normal cervical cord signal. Multiple sclerosis is unlikely in the absence of brain lesions. 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 Additionally, prominent T2 signal within anterior gray matter as seen in the conus on the prior thoracic MRI can be seen with cord infarct, although unlikely. No enhancement to suggest AIDP. 2. Mild cervical spondylosis greatest at the C5-6 level where predominantly discogenic disease results in mild bilateral foraminal narrowing. No significant canal stenosis. Electronically Signed   By: Kristine Garbe M.D.   On: 12/22/2016 01:51   Medications: I have reviewed the patient's current medications. Scheduled Meds: . gabapentin  300 mg Oral TID  . insulin aspart  0-9 Units Subcutaneous TID WC  . insulin glargine  5 Units Subcutaneous QHS  . meloxicam   7.5 mg Oral Daily  . nystatin   Topical TID  . polyethylene glycol  17 g Oral Daily   Continuous Infusions: . acyclovir Stopped (12/22/16 0641)  . methylPREDNISolone (SOLU-MEDROL) injection 1,000 mg (12/22/16 0811)   PRN Meds:.oxyCODONE-acetaminophen Assessment/Plan: Active Problems:   Leg weakness, bilateral   Bilateral leg weakness  Mrs. Fleishman is a 41 yo woman with a PMHx significant for degenerative disc disease complains of worsening leg weakness and loss of sensation that is likely associated with an acute demyelinating process, likely either viral myelitis or an initial flair up of MS.    Leg Weakness/Numbness bilaterally: The patient has symptoms and a thoracic MRI  concerning for a demyelinating process such as acute transverse myelitis, MS or Devic's disease. Recent MRI of the head and cervical spine shows no demyelination in these areas, making MS less likely.  The recent cold makes transverse myelitis 2/2 a viral infection a strong possibility. There are no signs of visual disturbance, putting Devic's disease lower on our differential.  Cu deficiency can present like subacute combined degeneration, which is unlikely but should be ruled out.  Neurology noted that there is also a risk that this could be associated with an autoimmune disease, but all antibody tests came back negative.  Plan: -The patient is on IV acyclovir to combat myelitis if it has a viral etiology. We will know more one the LP comes back whether or not this can be discontinued, or modified  - LP today, looking for cell count, protein glucose etc as well as PCR panel for  possible viral etiolgy  (VZV, HSV). - IV methylprednisone to combat inflammation -Cu levels to rule out deficiency which can cause muscle weakness.  Get Zn levels too as excess can sequester Cu   - Foley is maintained while the patient has urinary incontinence - consult PT/OT for rehab/ work up  Back pain and burning sensation in  thighs: The patient has a hx of back pain and burning sensation in her thighs that is thought to be sciatica.  At the moment this does not seem to be an issue for her but should be watched.   Plan: Gabapentin was restarted and oxy was given for the pain  Constipation: Patient has not had a BM in the hospital yet, even after giving the patient mirilax. There seems to be active bowel sounds so active peristalsis is likely.  The issue is most likely associated with sphincter control.   Plan:  Consider another round of mirilax.  Maybe docusate as a stool softener.    Hyperglycemia: The patient now has a BG level (via capillary) of 168, which is likely related to her steroid therpay.  However, she does have a significant family history of DM and her A1C was measured to be 6.9%, but will need to retest to confirm.  Plan:  Get secondary A1C today  -continue sliding scale insulin to control hypergycemia.   This is a Careers information officer Note.  The care of the patient was discussed with Dr. Lynnae January and the assessment and plan formulated with their assistance.  Please see their attached note for official documentation of the daily encounter.   LOS: 2 days   [name], Medical Student 12/22/2016, 8:14 AM

## 2016-12-23 ENCOUNTER — Encounter (HOSPITAL_COMMUNITY): Payer: Self-pay | Admitting: *Deleted

## 2016-12-23 LAB — GLUCOSE, CAPILLARY
GLUCOSE-CAPILLARY: 275 mg/dL — AB (ref 65–99)
GLUCOSE-CAPILLARY: 249 mg/dL — AB (ref 65–99)
GLUCOSE-CAPILLARY: 227 mg/dL — AB (ref 65–99)
Glucose-Capillary: 208 mg/dL — ABNORMAL HIGH (ref 65–99)

## 2016-12-23 LAB — PATHOLOGIST SMEAR REVIEW

## 2016-12-23 LAB — IGG CSF INDEX
ALBUMIN CSF-MCNC: 22 mg/dL (ref 11–48)
ALBUMIN: 3.6 g/dL (ref 3.5–5.5)
CSF IgG Index: 0.6 (ref 0.0–0.7)
IGG (IMMUNOGLOBIN G), SERUM: 910 mg/dL (ref 700–1600)
IGG CSF: 3.3 mg/dL (ref 0.0–8.6)
IgG/Alb Ratio, CSF: 0.15 (ref 0.00–0.25)

## 2016-12-23 LAB — HERPES SIMPLEX VIRUS(HSV) DNA BY PCR
HSV 1 DNA: NEGATIVE
HSV 2 DNA: NEGATIVE

## 2016-12-23 LAB — COPPER, SERUM: COPPER: 170 ug/dL — AB (ref 72–166)

## 2016-12-23 LAB — ZINC: ZINC: 65 ug/dL (ref 56–134)

## 2016-12-23 MED ORDER — ENOXAPARIN SODIUM 40 MG/0.4ML ~~LOC~~ SOLN
40.0000 mg | SUBCUTANEOUS | Status: DC
  Administered 2016-12-23: 40 mg via SUBCUTANEOUS
  Filled 2016-12-23 (×2): qty 0.4

## 2016-12-23 MED ORDER — INSULIN ASPART 100 UNIT/ML ~~LOC~~ SOLN
0.0000 [IU] | Freq: Every day | SUBCUTANEOUS | Status: DC
  Administered 2016-12-23 – 2016-12-24 (×2): 2 [IU] via SUBCUTANEOUS

## 2016-12-23 MED ORDER — INSULIN GLARGINE 100 UNIT/ML ~~LOC~~ SOLN
8.0000 [IU] | Freq: Every day | SUBCUTANEOUS | Status: DC
  Administered 2016-12-23: 8 [IU] via SUBCUTANEOUS
  Filled 2016-12-23: qty 0.08

## 2016-12-23 MED ORDER — INSULIN ASPART 100 UNIT/ML ~~LOC~~ SOLN
0.0000 [IU] | Freq: Three times a day (TID) | SUBCUTANEOUS | Status: DC
  Administered 2016-12-23: 5 [IU] via SUBCUTANEOUS
  Administered 2016-12-23 – 2016-12-24 (×2): 8 [IU] via SUBCUTANEOUS
  Administered 2016-12-24 – 2016-12-25 (×3): 5 [IU] via SUBCUTANEOUS
  Administered 2016-12-25: 3 [IU] via SUBCUTANEOUS
  Administered 2016-12-27 – 2016-12-28 (×3): 2 [IU] via SUBCUTANEOUS

## 2016-12-23 NOTE — Progress Notes (Addendum)
   Subjective: Currently, the patient feels well. No complications after LP yesterday. No HA or SOB. No BM yet. Eating, drinking well. Will work with PT today.  Interval Events: s/p LP, CSF studies pending  Objective: Vital signs in last 24 hours: Vitals:   12/22/16 1744 12/22/16 2108 12/23/16 0100 12/23/16 0451  BP: (!) 127/57 (!) 144/73 125/66 (!) 154/68  Pulse: 74 91 72 68  Resp: 18 18 18 18   Temp: 99 F (37.2 C) 98.2 F (36.8 C) 98.4 F (36.9 C) 98.4 F (36.9 C)  TempSrc: Oral Oral Oral Oral  SpO2: 98% 94% 94% 96%   Intake/Output:  05/08 0701 - 05/09 0700 In: 1130 [P.O.:1080; IV Piggyback:50] Out: 1600 [Urine:1600]    Physical Exam: Physical Exam  Constitutional: She is oriented to person, place, and time. She appears well-developed. She is cooperative. No distress.  HENT:  Head: Normocephalic and atraumatic.  Eyes: EOM are normal. Pupils are equal, round, and reactive to light.  Neck: Normal range of motion.  Cardiovascular: Normal rate, regular rhythm, intact distal pulses and normal pulses.   Pulmonary/Chest: Effort normal. No respiratory distress. Breasts are symmetrical.  Abdominal: Soft. Bowel sounds are normal. There is no tenderness.  Musculoskeletal: She exhibits no edema.  Neurological: She is alert and oriented to person, place, and time.  Full paraplegia below the hips. Improvement in sensation bilat above the knee to temp, light touch, pain. Small improvement below the knee bilat R>L to temp, light touch, no pain sesation below knee.  Skin: Skin is warm and dry.   Medications: Infusions: . methylPREDNISolone (SOLU-MEDROL) injection Stopped (12/23/16 0739)   Scheduled Medications: . enoxaparin (LOVENOX) injection  40 mg Subcutaneous Q24H  . gabapentin  300 mg Oral TID  . insulin aspart  0-15 Units Subcutaneous TID WC  . insulin aspart  0-5 Units Subcutaneous QHS  . insulin glargine  8 Units Subcutaneous QHS  . meloxicam  7.5 mg Oral Daily  .  nystatin   Topical TID  . polyethylene glycol  17 g Oral BID   PRN Medications: oxyCODONE-acetaminophen  Assessment/Plan: Pt is a 41 y.o. 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 Interventions at this time will be focused on diagnostic w/u and high dose steroids.   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 CSF studies pending, pleocytosis, mod low gluc, some prot, PCR and IgG/bands pending. Neurology following, no need for antiviral currently. Possible immunotherapy pending deterioration in exam, continue steroids for now. Check IgA for possible IgG therapy. - Solu-Medrol 1g qD x 4/5d - maintain foley for neurogenic bladder - PT/OT - q4h neuro-check - f/u IgA - f/u CSF - pain control w/ oxy-APAP 5-325mg  + gabapentin 300mg  TID  Length of Stay: 3 day(s) Dispo: Anticipated discharge after diagnostic w/u and completion of acute therapy.  Holley Raring, MD Pager: [phone] (7AM-5PM) 12/23/2016, 9:50 AM

## 2016-12-23 NOTE — Progress Notes (Signed)
Subjective: Interval History: minor improvement in the LE sensations R>L   Objective: Vital signs in last 24 hours: Temp:  [98.2 F (36.8 C)-99 F (37.2 C)] 98.8 F (37.1 C) (05/09 1030) Pulse Rate:  [68-93] 93 (05/09 1030) Resp:  [18-20] 20 (05/09 1030) BP: (121-154)/(57-73) 147/63 (05/09 1030) SpO2:  [94 %-98 %] 97 % (05/09 1030)  Intake/Output from previous day: 05/08 0701 - 05/09 0700 In: 1130 [P.O.:1080; IV Piggyback:50] Out: 1600 [Urine:1600] Intake/Output this shift: Total I/O In: 120 [P.O.:120] Out: -  Nutritional status: Diet Carb Modified Fluid consistency: Thin; Room service appropriate? Yes   Neurological Examination Mental Status: Alert, oriented, thought content appropriate.  Speech fluent without evidence of aphasia.  Able to follow 3 step commands without difficulty. Cranial Nerves: II: Visual fields intact, pupils equal, round, reactive to light  III,IV, VI: ptosis not present, extra-ocular movements intact bilaterally V,VII: smile symmetric, patchy decreased facial temp sensation VIII: hearing intact to voice IX,X: no hypophonia XI: Symmetric XII: midline tongue extension Motor: RUE and LUE 5/5 proximally and distally with normal tone.  RLE: Trace toe movement, otherwise 0/5 hip flexor, knee extensor/flexor and ankle dorsi/plantar flexion. LLE: 0/5 hip flexor, knee extensor/flexor, ankle dorsi/plantar flexion and toes.  Tone flaccid bilateral lower extremities.  Sensory: Decreased temperature sensation bilateral lower extremities.  Severely impaired proprioception to toes and ankles bilaterally. Decreased fine touch bilateral lower extremities, left worse than right.  Sensory level is at L1 bilaterally, with levels more rostrally exhibiting normal sensation on exam.  Deep Tendon Reflexes: 2+ bilateral brachioradialis and biceps. 0 patellae and achilles bilaterally. Toes mute bilaterally.  Cerebellar: Normal FNF bilaterally  Gait: Unable to assess  Lab  Results:  Recent Labs  12/21/16 0353  NA 135  K 4.4  CL 104  CO2 24  GLUCOSE 204*  BUN 9  CREATININE 0.65  CALCIUM 8.8*   Lipid Panel No results for input(s): CHOL, TRIG, HDL, CHOLHDL, VLDL, LDLCALC in the last 72 hours.  Studies/Results: Mr Jeri Cos And Wo Contrast  Result Date: 12/22/2016 CLINICAL DATA:  41 y/o F; bilateral lower extremity weakness chronic back pain. Possible demyelinating disorder. EXAM: MRI HEAD WITHOUT AND WITH CONTRAST TECHNIQUE: Multiplanar, multiecho pulse sequences of the brain and surrounding structures were obtained without and with intravenous contrast. CONTRAST:  28mL MULTIHANCE GADOBENATE DIMEGLUMINE 529 MG/ML IV SOLN COMPARISON:  None. FINDINGS: Brain: No acute infarction, hemorrhage, hydrocephalus, extra-axial collection or mass lesion. No significant T2 FLAIR signal abnormality of the brain. No white matter lesion identified. No abnormal enhancement. Vascular: Normal flow voids. Skull and upper cervical spine: Normal marrow signal. Sinuses/Orbits: Mild ethmoid sinus and sphenoid sinus mucosal thickening. No abnormal signal of mastoid air cells. Orbits are unremarkable. Other: None. IMPRESSION: No white matter lesion identified. No abnormal enhancement. Unremarkable MRI of brain. Please refer to findings of MRI of the cervical and thoracic spine. Electronically Signed   By: Kristine Garbe M.D.   On: 12/22/2016 01:18   Mr Thoracic Spine W Contrast  Result Date: 12/22/2016 CLINICAL DATA:  41 y/o  F; bilateral lower extremity weakness. EXAM: MRI CERVICAL WITHOUT AND WITH CONTRAST MRI THORACIC WITHOUT AND WITH CONTRAST TECHNIQUE: Multiplanar and multiecho pulse sequences of the cervical spine, to include the craniocervical junction and cervicothoracic junction, and thoracic spine, were obtained without and with intravenous contrast. CONTRAST:  73mL MULTIHANCE GADOBENATE DIMEGLUMINE 529 MG/ML IV SOLN COMPARISON:  None. FINDINGS: MRI CERVICAL SPINE FINDINGS  Alignment: Physiologic. Vertebrae: No fracture, evidence of discitis, or bone lesion. No  abnormal enhancement. Cord: Normal signal and morphology.  No abnormal enhancement. Posterior Fossa, vertebral arteries, paraspinal tissues: Prominent left level 3 lymph node measuring 13 x 9 mm (series 17, image 20), probably reactive. Disc levels: C2-3: No significant disc displacement, foraminal narrowing, or canal stenosis. C3-4: Mild left-sided uncovertebral and facet hypertrophy with mild left foraminal narrowing. No significant canal stenosis. C4-5: No significant disc displacement, foraminal narrowing, or canal stenosis. C5-6: Small disc osteophyte complex with mild bilateral uncovertebral and facet hypertrophy. Mild bilateral foraminal narrowing. No significant canal stenosis. C6-7: Small central protrusion. No significant foraminal narrowing or canal stenosis. C7-T1: No significant disc displacement, foraminal narrowing, or canal stenosis. MRI THORACIC SPINE FINDINGS T1 pre and postcontrast sequences were acquired. Alignment:  Physiologic. Vertebrae:  No abnormal enhancement. Cord:  No abnormal enhancement. Paraspinal and other soft tissues: Negative. IMPRESSION: 1. No abnormal enhancement of the spinal cord. Normal cervical cord signal. Multiple sclerosis is unlikely in the absence of brain lesions. 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 Additionally, prominent T2 signal within anterior gray matter as seen in the conus on the prior thoracic MRI can be seen with cord infarct, although unlikely. No enhancement to suggest AIDP. 2. Mild cervical spondylosis greatest at the C5-6 level where predominantly discogenic disease results in mild bilateral foraminal narrowing. No significant canal stenosis. Electronically Signed   By: Kristine Garbe M.D.   On: 12/22/2016 01:51   Mr Cervical Spine W Or Wo Contrast  Result Date: 12/22/2016 CLINICAL  DATA:  41 y/o  F; bilateral lower extremity weakness. EXAM: MRI CERVICAL WITHOUT AND WITH CONTRAST MRI THORACIC WITHOUT AND WITH CONTRAST TECHNIQUE: Multiplanar and multiecho pulse sequences of the cervical spine, to include the craniocervical junction and cervicothoracic junction, and thoracic spine, were obtained without and with intravenous contrast. CONTRAST:  74mL MULTIHANCE GADOBENATE DIMEGLUMINE 529 MG/ML IV SOLN COMPARISON:  None. FINDINGS: MRI CERVICAL SPINE FINDINGS Alignment: Physiologic. Vertebrae: No fracture, evidence of discitis, or bone lesion. No abnormal enhancement. Cord: Normal signal and morphology.  No abnormal enhancement. Posterior Fossa, vertebral arteries, paraspinal tissues: Prominent left level 3 lymph node measuring 13 x 9 mm (series 17, image 20), probably reactive. Disc levels: C2-3: No significant disc displacement, foraminal narrowing, or canal stenosis. C3-4: Mild left-sided uncovertebral and facet hypertrophy with mild left foraminal narrowing. No significant canal stenosis. C4-5: No significant disc displacement, foraminal narrowing, or canal stenosis. C5-6: Small disc osteophyte complex with mild bilateral uncovertebral and facet hypertrophy. Mild bilateral foraminal narrowing. No significant canal stenosis. C6-7: Small central protrusion. No significant foraminal narrowing or canal stenosis. C7-T1: No significant disc displacement, foraminal narrowing, or canal stenosis. MRI THORACIC SPINE FINDINGS T1 pre and postcontrast sequences were acquired. Alignment:  Physiologic. Vertebrae:  No abnormal enhancement. Cord:  No abnormal enhancement. Paraspinal and other soft tissues: Negative. IMPRESSION: 1. No abnormal enhancement of the spinal cord. Normal cervical cord signal. Multiple sclerosis is unlikely in the absence of brain lesions. 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 Additionally, prominent T2 signal  within anterior gray matter as seen in the conus on the prior thoracic MRI can be seen with cord infarct, although unlikely. No enhancement to suggest AIDP. 2. Mild cervical spondylosis greatest at the C5-6 level where predominantly discogenic disease results in mild bilateral foraminal narrowing. No significant canal stenosis. Electronically Signed   By: Kristine Garbe M.D.   On: 12/22/2016 01:51   Dg Fluoro Guide Lumbar Puncture  Result  Date: 12/22/2016 CLINICAL DATA:  Bilateral lower extremity weakness with negative brain and spine MRI. EXAM: DIAGNOSTIC LUMBAR PUNCTURE UNDER FLUOROSCOPIC GUIDANCE FLUOROSCOPY TIME:  Fluoroscopy Time:  0 minutes and 18 seconds. Radiation Exposure Index (if provided by the fluoroscopic device): 11 mGy Number of Acquired Spot Images: 0 PROCEDURE: Risks, benefits, and alternatives to the procedure were discussed with the patient prior to moving her on to the fluoro table. Emphasize risks included potential complications of headache, allergy, pain infection, and bleeding. Bleeding requiring surgical evacuation of epidural clock was discussed. With the patient prone, been appropriate skin site was identified using fluoroscopic guidance. The lower back was prepped and draped in the usual sterile fashion. 1% Lidocaine was used for local anesthesia. Lumbar puncture was performed at the L2-3 level using a 20 gauge needle with return of clear CSF with an opening pressure of 24 cm water. 8.5 ml of CSF were obtained for laboratory studies. The patient tolerated the procedure well and there were no apparent complications. IMPRESSION: Successful fluoroscopic guided lumbar puncture. Given the patient's extremely large body habitus, opening pressure was determined with the patient remaining in a prone position which could lead to a spurious result. Aliquots of CSF have been sent to the laboratory for dialysis. Electronically Signed   By: Misty Stanley M.D.   On: 12/22/2016 15:20     Medications: I have reviewed the patient's current medications.  Assessment/Plan: Pt is a 41 y.o. 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 Interventions at this time will be focused on diagnostic w/u and high dose steroids.   1) Transverse myelitis:  MRI Brain, C/T/L spine showed no new lesions, no contrast enhancements  thoracic lesions C/O  transverse myelitis.  -LP under flor done yesterday, CSF: WBC 106, RBC 3550, TP pending, rest of studies pending  - Solu-Medrol 1g qD  3/5 doses -will consider immunotherapy if there is no improvements on steroids, will check IGA to make sure she isnt IGA deficient if we were to start IVIG  - empiric therapy with acyclovir IV is not needed -Continue Neurontin  -PT/OT   LOS: 3 days   Arnaldo Natal, MD [facility]

## 2016-12-23 NOTE — Progress Notes (Signed)
Subjective:  Traci [name] is a charming woman that continues to have no movement in her lower legs, although mild sensation has been returning bilaterally.  She continues to not have a BM after multiple trials of miriliax.  She does not report any shortness of breath or vision changes. An LP was performed yesterday which the patient tolerated well.   Objective: Vital signs in last 24 hours: Vitals:   12/22/16 2108 12/23/16 0100 12/23/16 0451 12/23/16 1030  BP: (!) 144/73 125/66 (!) 154/68 (!) 147/63  Pulse: 91 72 68 93  Resp: 18 18 18 20   Temp: 98.2 F (36.8 C) 98.4 F (36.9 C) 98.4 F (36.9 C) 98.8 F (37.1 C)  TempSrc: Oral Oral Oral Oral  SpO2: 94% 94% 96% 97%    Intake/Output Summary (Last 24 hours) at 12/23/16 1302 Last data filed at 12/23/16 1100  Gross per 24 hour  Intake              120 ml  Output             1200 ml  Net            -1080 ml    General appearance: cooperative, no distress and moderately obese Lungs: clear to auscultation bilaterally Heart: regular rate and rhythm, S1, S2 normal, no murmur, click, rub or gallop Neurologic: Mental status: Alert, oriented, thought content appropriate Cranial nerves: normal Sensory: Has renewed sensation in both her legs with light touch.  Suprapopliteal area is most numb in both legs.  Motor: 0/5 BLE, normal otherwise   Lab Results:    Ref Range & Units 11:40 (12/23/16) 06:19 (12/23/16) 1d ago (12/22/16) 1d ago (12/22/16)   Glucose-Capillary 65 - 99 mg/dL 275   208   367   234     CSF Results: Appearance-- Pink and cloudy RBC -- 3550 WBC --106 Protein -- 36 Glucose -- 92 Oligoclonal bands -- processing IgG -- processing HSV PCR -- processing Pathologic Smear Review -- no malignant cells identified    Studies/Results: MRI's significant for demyelination in the thoracic segment with no demyelination in the cervical or head MRI.  Impression "Multiple sclerosis is unlikely in the absence of brain lesions.  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 Scheduled Meds: . enoxaparin (LOVENOX) injection  40 mg Subcutaneous Q24H  . gabapentin  300 mg Oral TID  . insulin aspart  0-15 Units Subcutaneous TID WC  . insulin aspart  0-5 Units Subcutaneous QHS  . insulin glargine  8 Units Subcutaneous QHS  . meloxicam  7.5 mg Oral Daily  . nystatin   Topical TID  . polyethylene glycol  17 g Oral BID   Continuous Infusions: . methylPREDNISolone (SOLU-MEDROL) injection Stopped (12/23/16 0739)   PRN Meds:.oxyCODONE-acetaminophen Assessment/Plan: Active Problems:   Leg weakness, bilateral   Bilateral leg weakness  Traci [name] is a 41 yo woman with a PMHx significant for degenerative disc disease with paraplegia but improving sensation in her legs, most likely associated with transverse myelitis.      Acute Transverse Myelitis: The patient's negative MRI lowers the possibility of her symptoms being associated with MS.  Her recent LP will still need to be reviewed but is suggestive of an infectiosus process, likely viral. Cu came back mildly elevated and does not correlated with her symptoms.   Plan: -neurology suggested to stop acyclovir as it does not have any further therapeutic  benefit  -Final CSF results pending - IV methylprednisolone 1g qD x 4/5d  to combat inflammation - Foley is maintained while the patient has urinary incontinence - consult PT/OT for rehab and placement for rehab  - q4h neuro-check - f/u IgA  Back pain and sciatic: The patient has a hx of back pain and sciatica that is now well controlled.   Plan: Continue gabapentin and oxy  Constipation: Patient has not had a BM in the hospital yet, even after multiple bouts of mirilax. There are active bowel sounds so active peristalsis is likely.  The issue is most likely associated with sphincter  control.   Plan:  Continue mirilax.  Maybe docusate as a stool softener.    Steroid induced hyperglycemia: The patient continues to have high BG due to steroid treatment.   Plan:  -continue sliding scale insulin to control hypergycemia.   This is a Careers information officer Note.  The care of the patient was discussed with Dr. Lynnae January and the assessment and plan formulated with their assistance.  Please see their attached note for official documentation of the daily encounter.   LOS: 3 days   [name], Medical Student 12/23/2016, 1:00 PM

## 2016-12-24 DIAGNOSIS — T380X5A Adverse effect of glucocorticoids and synthetic analogues, initial encounter: Secondary | ICD-10-CM

## 2016-12-24 DIAGNOSIS — R739 Hyperglycemia, unspecified: Secondary | ICD-10-CM

## 2016-12-24 LAB — GLUCOSE, CAPILLARY
GLUCOSE-CAPILLARY: 211 mg/dL — AB (ref 65–99)
Glucose-Capillary: 226 mg/dL — ABNORMAL HIGH (ref 65–99)
Glucose-Capillary: 237 mg/dL — ABNORMAL HIGH (ref 65–99)

## 2016-12-24 LAB — IGA: IgA: 147 mg/dL (ref 87–352)

## 2016-12-24 LAB — CBC
HEMATOCRIT: 38 % (ref 36.0–46.0)
HEMOGLOBIN: 12.1 g/dL (ref 12.0–15.0)
MCH: 25.1 pg — AB (ref 26.0–34.0)
MCHC: 31.8 g/dL (ref 30.0–36.0)
MCV: 78.8 fL (ref 78.0–100.0)
Platelets: 221 10*3/uL (ref 150–400)
RBC: 4.82 MIL/uL (ref 3.87–5.11)
RDW: 14.4 % (ref 11.5–15.5)
WBC: 10.5 10*3/uL (ref 4.0–10.5)

## 2016-12-24 LAB — CREATININE, SERUM
Creatinine, Ser: 0.73 mg/dL (ref 0.44–1.00)
GFR calc Af Amer: >60 mL/min (ref >60)
GFR calc non Af Amer: >60 mL/min (ref >60)

## 2016-12-24 MED ORDER — INSULIN ASPART 100 UNIT/ML ~~LOC~~ SOLN
4.0000 [IU] | Freq: Three times a day (TID) | SUBCUTANEOUS | Status: DC
  Administered 2016-12-24 – 2016-12-26 (×6): 4 [IU] via SUBCUTANEOUS

## 2016-12-24 MED ORDER — IMMUNE GLOBULIN (HUMAN) 20 GM/200ML IV SOLN
400.0000 mg/kg | INTRAVENOUS | Status: AC
  Administered 2016-12-24 – 2016-12-25 (×3): 50 g via INTRAVENOUS
  Filled 2016-12-24 (×2): qty 100

## 2016-12-24 MED ORDER — HEPARIN SODIUM (PORCINE) 5000 UNIT/ML IJ SOLN
5000.0000 [IU] | Freq: Three times a day (TID) | INTRAMUSCULAR | Status: DC
  Administered 2016-12-24 – 2016-12-29 (×15): 5000 [IU] via SUBCUTANEOUS
  Filled 2016-12-24 (×15): qty 1

## 2016-12-24 MED ORDER — INSULIN GLARGINE 100 UNIT/ML ~~LOC~~ SOLN
12.0000 [IU] | Freq: Every day | SUBCUTANEOUS | Status: DC
  Administered 2016-12-24 – 2016-12-26 (×3): 12 [IU] via SUBCUTANEOUS
  Filled 2016-12-24 (×3): qty 0.12

## 2016-12-24 MED ORDER — INSULIN GLARGINE 100 UNIT/ML ~~LOC~~ SOLN: 10.0000 [IU] | Freq: Every day | SUBCUTANEOUS | Status: DC

## 2016-12-24 NOTE — Progress Notes (Signed)
Inpatient Diabetes Program Recommendations  AACE/ADA: New Consensus Statement on Inpatient Glycemic Control (2015)  Target Ranges:  Prepandial:   less than 140 mg/dL      Peak postprandial:   less than 180 mg/dL (1-2 hours)      Critically ill patients:  140 - 180 mg/dL   Lab Results  Component Value Date   GLUCAP 211 (H) 12/24/2016   HGBA1C 6.9 (H) 12/21/2016    Review of Glycemic Control  Noted increase in Lantus to 12 units QHS and Novolog 4 units tidwc for meal coverage insulin. HgbA1C results - 6.9%, indicating diagnosis of DM. Blood sugars elevated with high-dose steroids.  Inpatient Diabetes Program Recommendations:    The Inpatient Diabetes Team will be happy to begin coordination of care for new diagnosis if you agree.  Please consult our team if pt to be diagnosed with DM2.  Will continue to follow. Thank you. Lorenda Peck, RD, LDN, CDE Inpatient Diabetes Coordinator [phone]

## 2016-12-24 NOTE — Progress Notes (Signed)
   Subjective: Currently, the patient feels well. Had 2 BM yesterday. No acute complaints. Temp sensation to hot water good yesterday. Still no movement below hips.  Objective: Vital signs in last 24 hours: Vitals:   12/23/16 1800 12/23/16 2150 12/24/16 0100 12/24/16 0515  BP: (!) 147/76 (!) 161/77 140/84 (!) 161/78  Pulse: 66 63 60 (!) 55  Resp: 20 20 20 20   Temp: 98.4 F (36.9 C) 99.1 F (37.3 C) 98.2 F (36.8 C) 98 F (36.7 C)  TempSrc: Oral Oral Oral Oral  SpO2: 97% 98% 96% 96%   Intake/Output:  05/09 0701 - 05/10 0700 In: 120 [P.O.:120] Out: 1950 [Urine:1950]    Physical Exam: Physical Exam  Constitutional: She is oriented to person, place, and time. She appears well-developed. She is cooperative. No distress.  HENT:  Head: Normocephalic and atraumatic.  Eyes: EOM are normal. Pupils are equal, round, and reactive to light.  Neck: Normal range of motion.  Cardiovascular: Normal rate, regular rhythm, intact distal pulses and normal pulses.   Pulmonary/Chest: Effort normal. No respiratory distress. Breasts are symmetrical.  Abdominal: Soft. Bowel sounds are normal. There is no tenderness.  Musculoskeletal: She exhibits no edema.  Neurological: She is alert and oriented to person, place, and time.  Full paraplegia below the hips. Improvement in sensation bilat above the knee to temp, light touch, pain. Small improvement below the knee bilat R>L to temp, light touch, no pain sesation below knee.  Skin: Skin is warm and dry.   Medications: Infusions: . methylPREDNISolone (SOLU-MEDROL) injection 1,000 mg (12/24/16 9150)   Scheduled Medications: . enoxaparin (LOVENOX) injection  40 mg Subcutaneous Q24H  . gabapentin  300 mg Oral TID  . insulin aspart  0-15 Units Subcutaneous TID WC  . insulin aspart  0-5 Units Subcutaneous QHS  . insulin glargine  8 Units Subcutaneous QHS  . meloxicam  7.5 mg Oral Daily  . nystatin   Topical TID  . polyethylene glycol  17 g Oral BID     PRN Medications: oxyCODONE-acetaminophen  Assessment/Plan: Pt is a 40 y.o. 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 Diagnostic w/u negative so far. Currently on high dose steroids.   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 CSF studies show pleocytosis, mod low gluc, some prot, IgG/bands wnl, PCR pending. Neurology following. Immunotherapy w/ IgG today (IgA wnl). - Solu-Medrol 1g qD x 5/5 days today - maintain foley for neurogenic bladder - PT/OT - q4h neuro-check - f/u CSF PCR - pain control w/ oxy-APAP 5-325mg  + gabapentin 300mg  TID  2) Steroid induced hyperglycemia: CBGs in 200s. A1c 6.9. Lantus 12U qHS + Novolog 4U TID + SSI-r TID qAC/HS  Length of Stay: 4 day(s) Dispo: Anticipated discharge after diagnostic w/u and completion of acute therapy.  Holley Raring, MD Pager: [phone] (7AM-5PM) 12/24/2016, 7:27 AM

## 2016-12-24 NOTE — Progress Notes (Signed)
Subjective:  Traci [name] is a charming woman that continues to have no movement in her lower legs, although mild sensation has been returning bilaterally. She no longer has a tingling sensation in her legs. She reports she can feel the temperature difference when using hot water for a bath, but was not able to notice when the water was cold. She had 2x BMs yesterday.  She does not report any shortness of breath or vision changes. LP done yesterday still has results pending, but neurology will start the patient on IVIG for 3-5 days.     Objective: Vital signs in last 24 hours: Vitals:   12/23/16 2150 12/24/16 0100 12/24/16 0515 12/24/16 0853  BP: (!) 161/77 140/84 (!) 161/78 (!) 152/95  Pulse: 63 60 (!) 55 62  Resp: 20 20 20 18   Temp: 99.1 F (37.3 C) 98.2 F (36.8 C) 98 F (36.7 C) 98.9 F (37.2 C)  TempSrc: Oral Oral Oral Oral  SpO2: 98% 96% 96% 95%    Intake/Output Summary (Last 24 hours) at 12/24/16 1109 Last data filed at 12/24/16 0518  Gross per 24 hour  Intake                0 ml  Output             1950 ml  Net            -1950 ml    General appearance: cooperative, no distress and moderately obese Lungs: clear to auscultation bilaterally Heart: regular rate and rhythm, S1, S2 normal, no murmur, click, rub or gallop Neurologic: Mental status: Alert, oriented, thought content appropriate Cranial nerves: normal Sensory: Low sensation to temperature in both legs, substantial loss of sensation to light touch bilaterally with left foot slightly better than the right Motor: 0/5 BLE, normal otherwise   Lab Results:    Ref Range & Units 11:40 (12/23/16) 06:19 (12/23/16) 1d ago (12/22/16) 1d ago (12/22/16)   Glucose-Capillary 65 - 99 mg/dL 275   208   367   234     CSF Results: Appearance-- Pink and cloudy RBC -- 3550 WBC --106 Protein -- 36 Glucose -- 92 Oligoclonal bands -- processing IgG -- Normal IgA -- Normal HSV PCR -- Negative Pathologic Smear Review -- no  malignant cells identified   IgG CSF index  Order: [id number]  Status:  Final result Visible to patient:  No (Not Released) Next appt:  None   Ref Range & Units 2d ago  IgG, CSF 0.0 - 8.6 mg/dL 3.3   Albumin CSF-mCnc 11 - 48 mg/dL 22   IgG (Immunoglobin G), Serum 700 - 1,600 mg/dL 910   Albumin 3.5 - 5.5 g/dL 3.6   IgG/Alb Ratio, CSF 0.00 - 0.25 0.15   CSF IgG Index 0.0 - 0.7 0.6          Studies/Results: MRI's significant for demyelination in the thoracic segment with no demyelination in the cervical or head MRI.  Impression "Multiple sclerosis is unlikely in the absence of brain lesions. 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 Scheduled Meds: . enoxaparin (LOVENOX) injection  40 mg Subcutaneous Q24H  . gabapentin  300 mg Oral TID  . Immune Globulin 10%  400 mg/kg Intravenous Q24 Hr x 5  . insulin aspart  0-15 Units Subcutaneous TID WC  . insulin aspart  0-5 Units Subcutaneous QHS  . insulin glargine  10 Units Subcutaneous  QHS  . meloxicam  7.5 mg Oral Daily  . nystatin   Topical TID  . polyethylene glycol  17 g Oral BID   Continuous Infusions:  PRN Meds:.oxyCODONE-acetaminophen Assessment/Plan: Active Problems:   Leg weakness, bilateral   Bilateral leg weakness  Traci [name] is a 41 yo woman with a PMHx significant for degenerative disc disease with paraplegia but improving sensation in her legs, most likely associated with transverse myelitis.      Acute Transverse Myelitis: The patient's negative MRI lowers the possibility of her symptoms being associated with MS.  Her recent LP will still need to be reviewed but the HSV PCR came back negative, so the etiology is still unclear. Her IgG CSF Index came back normal, and per the neurologist twill now start IGIV therapy.  Plan: -neurology suggested to stop acyclovir as it does not have  any further therapeutic benefit  -Neurology will start immunotherapy as the IGA was within normal limits. 3 days of IVIG, 5 days if needed - IV methylprednisolone 1g qD x 4/5d  to combat inflammation - Foley is maintained while the patient has urinary incontinence - consult PT/OT for rehab and placement for rehab  - q4h neuro-check - f/u IgA  Back pain and sciatic: The patient has a hx of back pain and sciatica that is now well controlled.   Plan: Continue gabapentin and oxy  Constipation: Patient had 2 bowel movement yesterday, but could not describe the consistently of the BM as she was not aware of them when they happened.  Plan:  Continue mirilax.  Add docusate to keep regular    Steroid induced hyperglycemia: The patient continues to have high BG due to steroid treatment.  Today her BG was 226, so we do need some better control.  Plan:  -continue sliding scale insulin to control hypergycemia.   This is a Careers information officer Note.  The care of the patient was discussed with Dr. Lynnae January and the assessment and plan formulated with their assistance.  Please see their attached note for official documentation of the daily encounter.   LOS: 4 days   [name], Medical Student 12/24/2016, 11:09 AM

## 2016-12-24 NOTE — Evaluation (Signed)
OT Cancellation Note  Patient Details Name: Traci [name] MRN: [id number] DOB: Apr 05, 1976   Cancelled Treatment:    Reason Eval/Treat Not Completed: Other (comment); Pt currently with active bedrest orders.Will continue to follow and complete evaluation once orders are lifted.    [name], OT Pager [phone] 12/24/2016   [name] 12/24/2016, 7:51 AM

## 2016-12-24 NOTE — Progress Notes (Signed)
Subjective: Patient is resting comfortably in bed. States that the sensation is coming back however she still has no movement in her lower extremities.  Objective: Current vital signs: BP (!) 152/95 (BP Location: Left Arm)   Pulse 62   Temp 98.9 F (37.2 C) (Oral)   Resp 18   SpO2 95%  Vital signs in last 24 hours: Temp:  [98 F (36.7 C)-99.1 F (37.3 C)] 98.9 F (37.2 C) (05/10 0853) Pulse Rate:  [55-93] 62 (05/10 0853) Resp:  [18-20] 18 (05/10 0853) BP: (140-161)/(63-95) 152/95 (05/10 0853) SpO2:  [95 %-98 %] 95 % (05/10 0853)  Intake/Output from previous day: 05/09 0701 - 05/10 0700 In: 120 [P.O.:120] Out: 1950 [Urine:1950] Intake/Output this shift: No intake/output data recorded. Nutritional status: Diet Carb Modified Fluid consistency: Thin; Room service appropriate? Yes  ROS:                                                                                                                                       History obtained from the patient  General ROS: negative for - chills, fatigue, fever, night sweats, weight gain or weight loss Psychological ROS: negative for - behavioral disorder, hallucinations, memory difficulties, mood swings or suicidal ideation Ophthalmic ROS: negative for - blurry vision, double vision, eye pain or loss of vision ENT ROS: negative for - epistaxis, nasal discharge, oral lesions, sore throat, tinnitus or vertigo Allergy and Immunology ROS: negative for - hives or itchy/watery eyes Hematological and Lymphatic ROS: negative for - bleeding problems, bruising or swollen lymph nodes Endocrine ROS: negative for - galactorrhea, hair pattern changes, polydipsia/polyuria or temperature intolerance Respiratory ROS: negative for - cough, hemoptysis, shortness of breath or wheezing Cardiovascular ROS: negative for - chest pain, dyspnea on exertion, edema or irregular heartbeat Gastrointestinal ROS: negative for - abdominal pain, diarrhea, hematemesis,  nausea/vomiting or stool incontinence Genito-Urinary ROS: negative for - dysuria, hematuria, incontinence or urinary frequency/urgency Musculoskeletal ROS: negative for - joint swelling or muscular weakness Neurological ROS: as noted in HPI Dermatological ROS: negative for rash and skin lesion changes    Neurologic Exam: Mental Status: Alert, oriented, thought content appropriate. Speech fluent without evidence of aphasia. Able to follow 3 step commands without difficulty. Cranial Nerves: II: Visual fields intact, pupils equal, round, reactive to light  III,IV, VI: ptosis not present, extra-ocular movementsintact bilaterally V,VII: smile symmetric, patchy decreased facial temp sensation VIII: hearing intact to voice IX,X: no hypophonia XI: Symmetric XII: midline tongue extension Motor: RUE and LUE 5/5 proximally and distally with normal tone.  RLE: Trace toe movement, otherwise 0/5 hip flexor, knee extensor/flexor and ankle dorsi/plantar flexion. LLE: 0/5 hip flexor, knee extensor/flexor, ankle dorsi/plantar flexion and toes.  Tone flaccid bilateral lower extremities.  Sensory: Decreased temperature sensation bilateral lower extremities.  Severely impaired proprioception to toes and ankles bilaterally. Decreased fine touch bilateral lower extremities, left worse than right.  Sensory level is at  L1 bilaterally, with levels more rostrally exhibiting normal sensation on exam.  Deep Tendon Reflexes: 2+ bilateral brachioradialis and biceps. 0 patellae and achilles bilaterally. Toes mute bilaterally.  Cerebellar: Normal FNF bilaterally    Lab Results: Basic Metabolic Panel:  Recent Labs Lab 12/20/16 0118 12/21/16 0353  NA 135 135  K 4.0 4.4  CL 103 104  CO2 24 24  GLUCOSE 175* 204*  BUN 5* 9  CREATININE 0.60 0.65  CALCIUM 9.1 8.8*    Liver Function Tests:  Recent Labs Lab 12/22/16 1408  ALBUMIN 3.6   No results for input(s): LIPASE, AMYLASE in the last 168  hours. No results for input(s): AMMONIA in the last 168 hours.  CBC:  Recent Labs Lab 12/20/16 0118  WBC 5.2  NEUTROABS 3.1  HGB 13.4  HCT 41.9  MCV 78.5  PLT 232    Cardiac Enzymes: No results for input(s): CKTOTAL, CKMB, CKMBINDEX, TROPONINI in the last 168 hours.  Lipid Panel: No results for input(s): CHOL, TRIG, HDL, CHOLHDL, VLDL, LDLCALC in the last 168 hours.  CBG:  Recent Labs Lab 12/23/16 0619 12/23/16 1140 12/23/16 1636 12/23/16 2149 12/24/16 0608  GLUCAP 208* 275* 249* 227* 226*    Microbiology: No results found for this or any previous visit.  Coagulation Studies:  Recent Labs  12/21/16 1623  LABPROT 13.7  INR 1.04    Imaging: Dg Fluoro Guide Lumbar Puncture  Result Date: 12/22/2016 CLINICAL DATA:  Bilateral lower extremity weakness with negative brain and spine MRI. EXAM: DIAGNOSTIC LUMBAR PUNCTURE UNDER FLUOROSCOPIC GUIDANCE FLUOROSCOPY TIME:  Fluoroscopy Time:  0 minutes and 18 seconds. Radiation Exposure Index (if provided by the fluoroscopic device): 11 mGy Number of Acquired Spot Images: 0 PROCEDURE: Risks, benefits, and alternatives to the procedure were discussed with the patient prior to moving her on to the fluoro table. Emphasize risks included potential complications of headache, allergy, pain infection, and bleeding. Bleeding requiring surgical evacuation of epidural clock was discussed. With the patient prone, been appropriate skin site was identified using fluoroscopic guidance. The lower back was prepped and draped in the usual sterile fashion. 1% Lidocaine was used for local anesthesia. Lumbar puncture was performed at the L2-3 level using a 20 gauge needle with return of clear CSF with an opening pressure of 24 cm water. 8.5 ml of CSF were obtained for laboratory studies. The patient tolerated the procedure well and there were no apparent complications. IMPRESSION: Successful fluoroscopic guided lumbar puncture. Given the patient's  extremely large body habitus, opening pressure was determined with the patient remaining in a prone position which could lead to a spurious result. Aliquots of CSF have been sent to the laboratory for dialysis. Electronically Signed   By: Misty Stanley M.D.   On: 12/22/2016 15:20    Medications:  Scheduled: . enoxaparin (LOVENOX) injection  40 mg Subcutaneous Q24H  . gabapentin  300 mg Oral TID  . Immune Globulin 10%  400 mg/kg Intravenous Q24 Hr x 5  . insulin aspart  0-15 Units Subcutaneous TID WC  . insulin aspart  0-5 Units Subcutaneous QHS  . insulin glargine  10 Units Subcutaneous QHS  . meloxicam  7.5 mg Oral Daily  . nystatin   Topical TID  . polyethylene glycol  17 g Oral BID    Assessment/Plan:  Pt is a 41 y.o.yo femalewith a PMHx of DDD who was admitted on 5/6/2018with symptoms of acute onset bilateral LE weakness and numbness, which was determined to be secondary to transverse myelitis.  Interventions at this time will be focused on diagnostic w/u and high dose steroids. Today will be her last dose of high-dose steroids and we will initiate IVIG for 3 days to see if she finds any improvement.  1) Transverse myelitis:  MRI Brain, C/T/L spine showed no new lesions, no contrast enhancements  thoracic lesions C/O  transverse myelitis.  -LP under flor done yesterday, CSF: WBC 106, RBC 3550, TP pending, rest of studies pending  - Solu-Medrol 1g qD  3/5 doses -will start immunotherapy as the IGA was within normal limits. She will receive 3 days of IVIG and may consider increasing it to 5 if we find improvement - empiric therapy with acyclovir IV is not needed -Continue Neurontin  -PT/OT       Etta Quill PA-C [facility] [phone]  12/24/2016, 10:07 AM

## 2016-12-24 NOTE — Evaluation (Signed)
Physical Therapy Evaluation Patient Details Name: Traci [name] MRN: [id number] DOB: February 17, 1976 Today's Date: 12/24/2016   History of Present Illness  PMHx of DDD who was admitted on 12/20/2016 with symptoms of acute onset bilateral LE weakness and numbness, which was determined to be secondary to transverse myelitis.   Clinical Impression  Pt admitted with/for progressive Bil LE weakness and altered sensation. Pt needs assist to move LE's for all tasks, but powers through with UE's well.  Needing min to max assist for bed mobility.   Pt currently limited functionally due to the problems listed. ( See problems list.)   Pt will benefit from PT to maximize function and safety in order to get ready for next venue listed below.     Follow Up Recommendations CIR    Equipment Recommendations  Rolling walker with 5" wheels;Other (comment) (TBA further depending on progress)    Recommendations for Other Services Rehab consult     Precautions / Restrictions Precautions Precautions: Fall Restrictions Weight Bearing Restrictions: No      Mobility  Bed Mobility Overal bed mobility: Needs Assistance Bed Mobility: Supine to Sit;Sit to Supine     Supine to sit: Min assist;HOB elevated (UE assist ) Sit to supine: Max assist (at LE's)  ;HOB elevated   General bed mobility comments: pt needed assist with Bil LE's, but little to no UE assist  Transfers                 General transfer comment: NT today.   Ambulation/Gait             General Gait Details: Pt unable due to relative abscence of LE movement and strength  Stairs            Wheelchair Mobility    Modified Rankin (Stroke Patients Only)       Balance Overall balance assessment: Needs assistance Sitting-balance support: Bilateral upper extremity supported;Single extremity supported Sitting balance-Leahy Scale: Poor Sitting balance - Comments: needs UE assist for balance without feet supported                                      Pertinent Vitals/Pain Pain Assessment: No/denies pain    Home Living Family/patient expects to be discharged to:: Private residence Living Arrangements: Non-relatives/Friends Available Help at Discharge: Friend(s);Family;Available PRN/intermittently Type of Home: House Home Access: Stairs to enter Entrance Stairs-Rails: Right;Left Entrance Stairs-Number of Steps: about 3 Home Layout: Two level;Able to live on main level with bedroom/bathroom Home Equipment: None      Prior Function Level of Independence: Independent         Comments: Pt works as a Training and development officer at [employer], was driving      Journalist, newspaper        Extremity/Trunk Assessment   Upper Extremity Assessment Upper Extremity Assessment: Overall WFL for tasks assessed    Lower Extremity Assessment Lower Extremity Assessment: RLE deficits/detail;LLE deficits/detail (trace movements at pelvis in ext and abd) RLE Sensation: decreased light touch RLE Coordination: decreased gross motor;decreased fine motor LLE Deficits / Details: trace movement L LE in hip ext/abd L > R LE LLE Sensation: decreased light touch LLE Coordination: decreased fine motor;decreased gross motor       Communication   Communication: No difficulties  Cognition Arousal/Alertness: Awake/alert Behavior During Therapy: WFL for tasks assessed/performed Overall Cognitive Status: Within Functional Limits for tasks assessed  General Comments      Exercises     Assessment/Plan    PT Assessment Patient needs continued PT services  PT Problem List Decreased strength;Decreased activity tolerance;Decreased balance;Decreased mobility;Decreased coordination;Decreased knowledge of use of DME;Decreased knowledge of precautions       PT Treatment Interventions Gait training;Functional mobility training;Therapeutic activities;Balance training    PT  Goals (Current goals can be found in the Care Plan section)  Acute Rehab PT Goals Patient Stated Goal: walk PT Goal Formulation: With patient Time For Goal Achievement: 01/07/17 Potential to Achieve Goals: Fair    Frequency Min 4X/week   Barriers to discharge Other (comment) (pt may not have enough support locally )      Co-evaluation PT/OT/SLP Co-Evaluation/Treatment: Yes Reason for Co-Treatment: Complexity of the patient's impairments (multi-system involvement);For patient/therapist safety PT goals addressed during session: Mobility/safety with mobility OT goals addressed during session: ADL's and self-care       AM-PAC PT "6 Clicks" Daily Activity  Outcome Measure Difficulty turning over in bed (including adjusting bedclothes, sheets and blankets)?: Total Difficulty moving from lying on back to sitting on the side of the bed? : Total Difficulty sitting down on and standing up from a chair with arms (e.g., wheelchair, bedside commode, etc,.)?: Total Help needed moving to and from a bed to chair (including a wheelchair)?: Total Help needed walking in hospital room?: Total Help needed climbing 3-5 steps with a railing? : Total 6 Click Score: 6    End of Session   Activity Tolerance: Patient tolerated treatment well Patient left: in bed;with call bell/phone within reach;with chair alarm set Nurse Communication: Mobility status PT Visit Diagnosis: Other symptoms and signs involving the nervous system (R29.898);Muscle weakness (generalized) (M62.81)    Time: 4128-7867 PT Time Calculation (min) (ACUTE ONLY): 23 min   Charges:   PT Evaluation $PT Eval High Complexity: 1 Procedure     PT G Codes:        Jan 09, 2017  Donnella Sham, PT [phone]  (pager)  Tessie Fass Wladyslaw Henrichs 01-09-17, 5:51 PM

## 2016-12-24 NOTE — Care Management Note (Signed)
Case Management Note  Patient Details  Name: Traci [name] MRN: [id number] Date of Birth: 09-16-1975  Subjective/Objective:                    Action/Plan: Pt starting IVIG. Awaiting PT/OT recommendations. CM following for d/c need, physician orders.   Expected Discharge Date:                  Expected Discharge Plan:  Home/Self Care  In-House Referral:     Discharge planning Services     Post Acute Care Choice:    Choice offered to:     DME Arranged:    DME Agency:     HH Arranged:    HH Agency:     Status of Service:  In process, will continue to follow  If discussed at Long Length of Stay Meetings, dates discussed:    Additional Comments:  Pollie Friar, RN 12/24/2016, 11:29 AM

## 2016-12-24 NOTE — Evaluation (Signed)
Occupational Therapy Evaluation Patient Details Name: Traci [name] MRN: [id number] DOB: Oct 10, 1975 Today's Date: 12/24/2016    History of Present Illness PMHx of DDD who was admitted on 12/20/2016 with symptoms of acute onset bilateral LE weakness and numbness, which was determined to be secondary to transverse myelitis.    Clinical Impression   Pt is a 41 y/o F who presents with the above. At baseline Pt is independent with ADLs/IADLs, was driving, and working. Pt currently has significantly decreased sensation and function in BLEs. Pt demonstrates good UE strength, able to complete bed mobility and sitting EOB during this session. Pt will benefit from continued OT services in acute and postacute settings in order to increase safety and independence with ADLs and functional mobility.     Follow Up Recommendations  CIR    Equipment Recommendations  3 in 1 bedside commode (will need bariatric BSC)           Precautions / Restrictions Precautions Precautions: Fall Restrictions Weight Bearing Restrictions: No      Mobility Bed Mobility Overal bed mobility: Needs Assistance Bed Mobility: Supine to Sit;Sit to Supine     Supine to sit: Min assist;HOB elevated (UE assist ) Sit to supine: Min guard;HOB elevated   General bed mobility comments: pt needed assist with Bil LE's, but little to no UE assist  Transfers                 General transfer comment: NT today.     Balance Overall balance assessment: Needs assistance Sitting-balance support: Bilateral upper extremity supported;Single extremity supported Sitting balance-Leahy Scale: Poor Sitting balance - Comments: needs UE assist for balance without feet supported                                   ADL either performed or assessed with clinical judgement   ADL Overall ADL's : Needs assistance/impaired Eating/Feeding: Sitting;Set up   Grooming: Wash/dry face;Set up;Sitting Grooming Details  (indicate cue type and reason): supported sitting, increased assist for unsupported  Upper Body Bathing: Min guard;Sitting Upper Body Bathing Details (indicate cue type and reason): supported sitting, increased assist for unsupported  Lower Body Bathing: Moderate assistance;Bed level   Upper Body Dressing : Min guard;Sitting Upper Body Dressing Details (indicate cue type and reason): supported sitting, increased assist for unsupported  Lower Body Dressing: Maximal assistance;Bed level   Toilet Transfer: Total assistance   Toileting- Clothing Manipulation and Hygiene: Total assistance;Bed level Toileting - Clothing Manipulation Details (indicate cue type and reason): foley             Vision Baseline Vision/History: No visual deficits                  Pertinent Vitals/Pain Pain Assessment: No/denies pain     Hand Dominance     Extremity/Trunk Assessment Upper Extremity Assessment Upper Extremity Assessment: Overall WFL for tasks assessed   Lower Extremity Assessment Lower Extremity Assessment: RLE deficits/detail;LLE deficits/detail (trace movements at pelvis in ext and abd) RLE Sensation: decreased light touch RLE Coordination: decreased gross motor;decreased fine motor LLE Deficits / Details: trace movement L LE in hip ext/abd L > R LE LLE Sensation: decreased light touch LLE Coordination: decreased fine motor;decreased gross motor       Communication Communication Communication: No difficulties   Cognition Arousal/Alertness: Awake/alert Behavior During Therapy: WFL for tasks assessed/performed Overall Cognitive Status: Within Functional Limits for tasks assessed  General Comments       Exercises Exercises:  (LE PROM exercise during MMT )   Shoulder Instructions      Home Living Family/patient expects to be discharged to:: Private residence Living Arrangements: Non-relatives/Friends Available Help at  Discharge: Friend(s);Family;Available PRN/intermittently Type of Home: House Home Access: Stairs to enter [employer] of Steps: about 3 Entrance Stairs-Rails: Right;Left Home Layout: Two level;Able to live on main level with bedroom/bathroom Alternate Level Stairs-Number of Steps: 17-20 Alternate Level Stairs-Rails: Left Bathroom Shower/Tub: Tub/shower unit;Curtain   Bathroom Toilet: Standard Bathroom Accessibility: Yes How Accessible: Accessible via walker Home Equipment: None          Prior Functioning/Environment Level of Independence: Independent        Comments: Pt works as a Training and development officer at [employer], was driving         OT Problem List: Decreased strength;Decreased activity tolerance      OT Treatment/Interventions: Field seismologist;Therapeutic exercise;Energy conservation;Neuromuscular education;DME and/or AE instruction;Therapeutic activities;Patient/family education;Balance training    OT Goals(Current goals can be found in the care plan section) Acute Rehab OT Goals Patient Stated Goal: walk OT Goal Formulation: With patient Time For Goal Achievement: 12/31/16 Potential to Achieve Goals: Good ADL Goals Pt Will Perform Grooming: sitting;with min assist (unsupported sitting) Pt/caregiver will Perform Home Exercise Program: Increased strength;Both right and left upper extremity;With theraband;With written HEP provided Additional ADL Goal #1: Pt will complete bed mobility with MinA in preparation for completing EOB ADLs. Additional ADL Goal #2: Pt will complete latera scooting side to side with MinA in preparation for lateral functional mobility transfers.  OT Frequency: Min 2X/week               Co-evaluation PT/OT/SLP Co-Evaluation/Treatment: Yes Reason for Co-Treatment: Complexity of the patient's impairments (multi-system involvement);For patient/therapist safety PT goals addressed during session: Mobility/safety with mobility OT goals  addressed during session: ADL's and self-care      AM-PAC PT "6 Clicks" Daily Activity     Outcome Measure Help from another person eating meals?: None Help from another person taking care of personal grooming?: A Little Help from another person toileting, which includes using toliet, bedpan, or urinal?: Total Help from another person bathing (including washing, rinsing, drying)?: A Lot Help from another person to put on and taking off regular upper body clothing?: A Little Help from another person to put on and taking off regular lower body clothing?: A Lot 6 Click Score: 15   End of Session    Activity Tolerance: Patient tolerated treatment well Patient left: in bed;with call bell/phone within reach  OT Visit Diagnosis: Muscle weakness (generalized) (M62.81);Other symptoms and signs involving the nervous system (R29.898)                Time: 9242-6834 OT Time Calculation (min): 33 min Charges:  OT General Charges $OT Visit: 1 Procedure OT Evaluation $OT Eval Moderate Complexity: 1 Procedure G-Codes:     Traci [name], OT Pager [phone] 12/24/2016   Traci [name] 12/24/2016, 5:54 PM

## 2016-12-25 DIAGNOSIS — T380X5A Adverse effect of glucocorticoids and synthetic analogues, initial encounter: Secondary | ICD-10-CM

## 2016-12-25 DIAGNOSIS — R739 Hyperglycemia, unspecified: Secondary | ICD-10-CM

## 2016-12-25 DIAGNOSIS — G373 Acute transverse myelitis in demyelinating disease of central nervous system: Secondary | ICD-10-CM

## 2016-12-25 LAB — GLUCOSE, CAPILLARY
GLUCOSE-CAPILLARY: 203 mg/dL — AB (ref 65–99)
GLUCOSE-CAPILLARY: 113 mg/dL — AB (ref 65–99)
GLUCOSE-CAPILLARY: 125 mg/dL — AB (ref 65–99)
Glucose-Capillary: 258 mg/dL — ABNORMAL HIGH (ref 65–99)
Glucose-Capillary: 152 mg/dL — ABNORMAL HIGH (ref 65–99)

## 2016-12-25 LAB — OLIGOCLONAL BANDS, CSF + SERM

## 2016-12-25 MED ORDER — ACETAMINOPHEN 325 MG PO TABS
650.0000 mg | ORAL_TABLET | Freq: Four times a day (QID) | ORAL | Status: DC | PRN
  Administered 2016-12-25 – 2016-12-28 (×5): 650 mg via ORAL
  Filled 2016-12-25 (×5): qty 2

## 2016-12-25 NOTE — Progress Notes (Signed)
Inpatient Rehabilitation  Per therapy request, patient was screened by Gunnar Fusi for appropriateness for an Inpatient Acute Rehab consult.  At this time we are recommending an Inpatient Rehab consult.  MD text paged to notify; please order if you are agreeable.  Carmelia Roller., CCC/SLP Admission Coordinator  [facility]  Cell [phone]

## 2016-12-25 NOTE — Progress Notes (Signed)
Physical Therapy Treatment Patient Details Name: Traci [name] MRN: [id number] DOB: 03/11/1976 Today's Date: 12/25/2016    History of Present Illness pt is a 41 y/o female with h/o obesity and DDD who presents with sudden onset of bilatteral weakness with gradual degradation of both strength and sensation in L LE before R LE. MRI shows some disc protrusion and mild narrowing at certain levels from cervical to lumbar spine, but MD's feel the presentation does not represent what the images show. LP completed, results not back. MRI brain not abnormal. Work up continues.    PT Comments    Pt neurologically the same as eval, subjectively.  Pt with no active movement in LE's.  Emphasized scooting to EOB with w/shift and assymmetrical technique.  Successful assisted standing trials and squat transfer.   Follow Up Recommendations  CIR     Equipment Recommendations  Other (comment) (TBA)    Recommendations for Other Services Rehab consult     Precautions / Restrictions Precautions Precautions: Fall    Mobility  Bed Mobility Overal bed mobility: Needs Assistance Bed Mobility: Supine to Sit     Supine to sit: Min guard (mod for legs)     General bed mobility comments: pt use roll of Upper Body to assist drag of bil LE's to EOB and min truncal assist up via R elbow. (some use of rail and HOB raised 20*)  Transfers Overall transfer level: Needs assistance   Transfers: Sit to/from [employer] Transfers Sit to Stand: Max assist;+2 physical assistance   Squat pivot transfers: Total assist;+2 physical assistance     General transfer comment: pt assists significantly with UE's, but no assist from LE's   Ambulation/Gait             General Gait Details: not able   Stairs            Wheelchair Mobility    Modified Rankin (Stroke Patients Only)       Balance Overall balance assessment: Needs assistance Sitting-balance support: Single extremity  supported;Bilateral upper extremity supported Sitting balance-Leahy Scale: Poor Sitting balance - Comments: Pt sat EOB approx 20 min.  Able to maintain safe sitting EOB with therapists not close by.  Safest with 1 UE assist   Standing balance support: Bilateral upper extremity supported Standing balance-Leahy Scale: Zero Standing balance comment: stood x2 with significant 2 person facilitation with support at knees and use of pad.  Pt unable to get her trunk upright.                            Cognition Arousal/Alertness: Awake/alert Behavior During Therapy: WFL for tasks assessed/performed Overall Cognitive Status: Within Functional Limits for tasks assessed                                        Exercises      General Comments General comments (skin integrity, edema, etc.): MD witnessed sitting EOB, pt with no lower reflexes      Pertinent Vitals/Pain Pain Assessment: No/denies pain    Home Living                      Prior Function            PT Goals (current goals can now be found in the care plan section) Acute Rehab PT Goals Patient Stated  Goal: walk PT Goal Formulation: With patient Time For Goal Achievement: 01/07/17 Potential to Achieve Goals: Fair Progress towards PT goals: Progressing toward goals    Frequency    Min 4X/week      PT Plan Current plan remains appropriate    Co-evaluation              AM-PAC PT "6 Clicks" Daily Activity  Outcome Measure  Difficulty turning over in bed (including adjusting bedclothes, sheets and blankets)?: Total Difficulty moving from lying on back to sitting on the side of the bed? : Total Difficulty sitting down on and standing up from a chair with arms (e.g., wheelchair, bedside commode, etc,.)?: Total Help needed moving to and from a bed to chair (including a wheelchair)?: Total Help needed walking in hospital room?: Total Help needed climbing 3-5 steps with a railing?  : Total 6 Click Score: 6    End of Session   Activity Tolerance: Patient tolerated treatment well Patient left: in chair;with call bell/phone within reach;with chair alarm set;Other (comment) (lift pad in the chair) Nurse Communication: Mobility status PT Visit Diagnosis: Other symptoms and signs involving the nervous system (R29.898);Muscle weakness (generalized) (M62.81)     Time: 3968-8648 PT Time Calculation (min) (ACUTE ONLY): 52 min  Charges:  $Therapeutic Activity: 23-37 mins                    G Codes:       Dec 31, 2016  Donnella Sham, PT [phone]  (pager)   Tessie Fass Leontina Skidmore 12/31/16, 4:21 PM

## 2016-12-25 NOTE — Progress Notes (Addendum)
   Subjective: Currently, the patient feels well. Having lots of BMs. No change in LE symptoms.  Objective: Vital signs in last 24 hours: Vitals:   12/25/16 1200 12/25/16 1216 12/25/16 1237 12/25/16 1251  BP: (!) 148/76 (!) 155/71 (!) 143/76 130/68  Pulse: 66 72 67 77  Resp: 17 16 16 16   Temp: 97.9 F (36.6 C) 98.1 F (36.7 C)    TempSrc: Oral Oral    SpO2: 100% 97% 100% 100%  Weight:      Height:       Intake/Output:  05/10 0701 - 05/11 0700 In: 2460 [P.O.:960; I.V.:500] Out: 1100 [Urine:1100]    Physical Exam: Physical Exam  Constitutional: She is oriented to person, place, and time. She appears well-developed. She is cooperative. No distress.  HENT:  Head: Normocephalic and atraumatic.  Eyes: EOM are normal. Pupils are equal, round, and reactive to light.  Neck: Normal range of motion.  Cardiovascular: Normal rate, regular rhythm, intact distal pulses and normal pulses.   Pulmonary/Chest: Effort normal. No respiratory distress. Breasts are symmetrical.  Abdominal: Soft. Bowel sounds are normal. There is no tenderness.  Musculoskeletal: She exhibits no edema.  Neurological: She is alert and oriented to person, place, and time.  Full paraplegia below the hips. Improvement in sensation bilat above the knee to temp, light touch, pain. Small improvement below the knee bilat R>L to temp, light touch, no pain sesation below knee.  Skin: Skin is warm and dry.   Medications: Infusions:  Scheduled Medications: . gabapentin  300 mg Oral TID  . heparin  5,000 Units Subcutaneous Q8H  . insulin aspart  0-15 Units Subcutaneous TID WC  . insulin aspart  0-5 Units Subcutaneous QHS  . insulin aspart  4 Units Subcutaneous TID WC  . insulin glargine  12 Units Subcutaneous QHS  . meloxicam  7.5 mg Oral Daily  . nystatin   Topical TID  . polyethylene glycol  17 g Oral BID   PRN Medications: oxyCODONE-acetaminophen  Assessment/Plan: Pt is a 41 y.o. yo female with a PMHx of DDD  who was admitted on 12/20/2016 with symptoms of acute onset bilateral LE weakness and numbness, which was determined to be secondary to transverse myelitis. Diagnostic w/u negative so far. Currently on high dose steroids.   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 CSF studies show pleocytosis, mod low gluc, some prot, IgG/bands wnl, PCR pending. Neurology following. Immunotherapy w/ IgG today (IgA wnl). - IV IgG day 2 of 3-5. - maintain foley for neurogenic bladder - PT/OT - q4h neuro-check - f/u CSF PCR - pain control w/ oxy-APAP 5-325mg  + gabapentin 300mg  TID  2) Steroid induced hyperglycemia: CBGs in 150 today. A1c 6.9. Lantus 12U qHS + Novolog 4U TID + SSI-r TID qAC/HS  Length of Stay: 5 day(s) Dispo: PT/OT recommend CIR, pt will be a good candidate regardless of improvement with LE motor function, but will hold off on CIR c/s until prognosis regarding LE function is more certain.  Holley Raring, MD Pager: [phone] (7AM-5PM) 12/25/2016, 1:16 PM

## 2016-12-25 NOTE — NC FL2 (Signed)
[place] LEVEL OF CARE SCREENING TOOL     IDENTIFICATION  Patient Name: Traci [name] Birthdate: [birth year] Sex: female Admission Date (Current Location): 12/20/2016  [facility] and [place] Number:  Herbalist and Address:  [facility], [place] [address], [place], [place] [address]      Provider Number: [id number]  Attending Physician Name and Address:  Bartholomew Crews, MD  Relative Name and Phone Number:       Current Level of Care: Hospital Recommended Level of Care: [place] Prior Approval Number:    Date Approved/Denied:   PASRR Number: [id number]  Discharge Plan: SNF    Current Diagnoses: Patient Active Problem List   Diagnosis Date Noted  . Transverse myelitis ([place]) 12/25/2016  . Steroid-induced hyperglycemia 12/25/2016    Orientation RESPIRATION BLADDER Height & Weight     Self, Time, Situation, Place  Normal External catheter Weight: 290 lb 6.4 oz (131.7 kg) Height:  5' (152.4 cm)  BEHAVIORAL SYMPTOMS/MOOD NEUROLOGICAL BOWEL NUTRITION STATUS      Incontinent Diet (carb modified)  AMBULATORY STATUS COMMUNICATION OF NEEDS Skin   Extensive Assist Verbally Normal                       Personal Care Assistance Level of Assistance  Bathing, Dressing Bathing Assistance: Maximum assistance   Dressing Assistance: Maximum assistance     Functional Limitations Info             SPECIAL CARE FACTORS FREQUENCY  PT (By licensed PT), OT (By licensed OT)     PT Frequency: 5x/wk OT Frequency: 5x/wk            Contractures      Additional Factors Info  Code Status, Allergies, Insulin Sliding Scale Code Status Info: full Allergies Info: nka   Insulin Sliding Scale Info: 3x/day       Current Medications (12/25/2016):  This is the current hospital active medication list Current Facility-Administered Medications  Medication Dose Route Frequency Provider Last Rate Last Dose  .  gabapentin (NEURONTIN) capsule 300 mg  300 mg Oral TID Holley Raring, MD   300 mg at 12/25/16 1150  . heparin injection 5,000 Units  5,000 Units Subcutaneous Q8H Holley Raring, MD   5,000 Units at 12/25/16 1440  . insulin aspart (novoLOG) injection 0-15 Units  0-15 Units Subcutaneous TID WC Holley Raring, MD   3 Units at 12/25/16 1151  . insulin aspart (novoLOG) injection 0-5 Units  0-5 Units Subcutaneous QHS Holley Raring, MD   2 Units at 12/24/16 2333  . insulin aspart (novoLOG) injection 4 Units  4 Units Subcutaneous TID WC Holley Raring, MD   4 Units at 12/25/16 1159  . insulin glargine (LANTUS) injection 12 Units  12 Units Subcutaneous QHS Holley Raring, MD   12 Units at 12/24/16 2341  . meloxicam (MOBIC) tablet 7.5 mg  7.5 mg Oral Daily Shela Leff, MD   7.5 mg at 12/25/16 1150  . nystatin (MYCOSTATIN/NYSTOP) topical powder   Topical TID Holley Raring, MD      . oxyCODONE-acetaminophen (PERCOCET/ROXICET) 5-325 MG per tablet 1 tablet  1 tablet Oral Q4H PRN Holley Raring, MD   1 tablet at 12/21/16 2117  . polyethylene glycol (MIRALAX / GLYCOLAX) packet 17 g  17 g Oral BID Holley Raring, MD   17 g at 12/23/16 1024     Discharge Medications: Please see discharge summary for a list of discharge medications.  Relevant Imaging Results:  Relevant Lab Results:   Additional Information SS#: [id number]  Geralynn Ochs, LCSW

## 2016-12-25 NOTE — Clinical Social Work Note (Signed)
Clinical Social Work Assessment  Patient Details  Name: Traci [name] MRN: [id number] Date of Birth: Aug 23, 1975  Date of referral:  12/25/16               Reason for consult:  Facility Placement, Discharge Planning                Permission sought to share information with:  Facility Art therapist granted to share information::  Yes, Verbal Permission Granted  Name::        Agency::  SNF  Relationship::     Contact Information:     Housing/Transportation Living arrangements for the past 2 months:  Single Family Home Source of Information:  Patient Patient Interpreter Needed:  None Criminal Activity/Legal Involvement Pertinent to Current Situation/Hospitalization:  No - Comment as needed Significant Relationships:  Friend Lives with:  Self, Roommate Do you feel safe going back to the place where you live?  Yes Need for family participation in patient care:  No (Coment)  Care giving concerns:  Pt has increased care giving needs due to lower body weakness and numbness, and has no support at home to help her during the day.    Social Worker assessment / plan:  CSW explained SNF referral and placement. Pt discussed that she resides with a roommate and the roommate's daughter, but they are both gone during the day. Pt also discussed that her family lives over two hours away from her, and they all work full-time and wouldn't be able to care for her during the day. Pt indicated she was unfamiliar with SNF placement and asked for information. CSW provided list of local SNF options.   Employment status:  Therapist, music:  Managed Care PT Recommendations:  [place] / Referral to community resources:     Patient/Family's Response to care:  Pt was agreeable to SNF placement.  Patient/Family's Understanding of and Emotional Response to Diagnosis, Current Treatment, and Prognosis:  Pt appeared hopeful in her ability to recover her  strength back. Pt understanding of the need for rehab and therapy in order to build her strength and improve her ability to function independently. Pt indicated understanding of CSW role in discharge planning process.  Emotional Assessment Appearance:  Appears stated age Attitude/Demeanor/Rapport:    Affect (typically observed):  Appropriate Orientation:  Oriented to Self, Oriented to Place, Oriented to  Time, Oriented to Situation Alcohol / Substance use:  Not Applicable Psych involvement (Current and /or in the community):  No (Comment)  Discharge Needs  Concerns to be addressed:  Care Coordination, Discharge Planning Concerns Readmission within the last 30 days:  No Current discharge risk:  Physical Impairment Barriers to Discharge:  Continued Medical Work up   [employer], [place] 12/25/2016, 3:01 PM

## 2016-12-25 NOTE — Progress Notes (Signed)
Subjective:  NAONE.  She reports new changes in her movement or sensation.  She has had multiple bowel movements.  She does not report any shortness of breath or vision changes.  She continues to have glucose levels in the 200s, so we discussed some dietary modifications. PT/OT came and worked on sitting up with the patient, which she was able to do.   She is on day 2 of her IVIG treatment.    Objective: Vital signs in last 24 hours: Vitals:   12/25/16 0918 12/25/16 1200 12/25/16 1216 12/25/16 1237  BP: 138/60 (!) 148/76 (!) 155/71 (!) 143/76  Pulse: 66 66 72 67  Resp: 16 17 16 16   Temp: 98 F (36.7 C) 97.9 F (36.6 C) 98.1 F (36.7 C)   TempSrc: Oral Oral Oral   SpO2: 100% 100% 97% 100%  Weight:      Height:        Intake/Output Summary (Last 24 hours) at 12/25/16 1248 Last data filed at 12/25/16 0140  Gross per 24 hour  Intake             1860 ml  Output             1100 ml  Net              760 ml    General appearance: cooperative, no distress and moderately obese Lungs: clear to auscultation bilaterally Heart: regular rate and rhythm, S1, S2 normal, no murmur, click, rub or gallop Neurologic: Mental status: Alert, oriented, thought content appropriate Cranial nerves: normal Sensory: Low sensation to temperature in both legs, substantial loss of sensation to light touch bilaterally with left foot slightly better than the right Motor: 0/5 BLE, normal otherwise   Lab Results:    Ref Range & Units 11:20 (12/25/16) 06:27 (12/25/16) 1d ago (12/24/16) 1d ago (12/24/16)   Glucose-Capillary 65 - 99 mg/dL 152   203   237   258      CSF Results: Appearance-- Pink and cloudy RBC -- 3550 WBC --106 Protein -- 36 Glucose -- 92 Oligoclonal bands -- processing IgG -- Normal IgA -- Normal HSV PCR -- Negative Pathologic Smear Review -- no malignant cells identified   IgG CSF index  Order: [id number]  Status:  Final result Visible to patient:  No (Not Released) Next appt:   None   Ref Range & Units 2d ago  IgG, CSF 0.0 - 8.6 mg/dL 3.3   Albumin CSF-mCnc 11 - 48 mg/dL 22   IgG (Immunoglobin G), Serum 700 - 1,600 mg/dL 910   Albumin 3.5 - 5.5 g/dL 3.6   IgG/Alb Ratio, CSF 0.00 - 0.25 0.15   CSF IgG Index 0.0 - 0.7 0.6          Studies/Results: MRI's significant for demyelination in the thoracic segment with no demyelination in the cervical or head MRI.  Impression "Multiple sclerosis is unlikely in the absence of brain lesions. 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 Scheduled Meds: . gabapentin  300 mg Oral TID  . heparin  5,000 Units Subcutaneous Q8H  . insulin aspart  0-15 Units Subcutaneous TID WC  . insulin aspart  0-5 Units Subcutaneous QHS  . insulin aspart  4 Units Subcutaneous TID WC  . insulin glargine  12 Units Subcutaneous QHS  . meloxicam  7.5 mg Oral Daily  . nystatin   Topical TID  . polyethylene  glycol  17 g Oral BID   Continuous Infusions:  PRN Meds:.oxyCODONE-acetaminophen Assessment/Plan: Active Problems:   Leg weakness, bilateral   Bilateral leg weakness  Mrs. Wollenberg is a 41 yo woman with a PMHx significant for degenerative disc disease with paraplegia but improving sensation in her legs, most likely associated with transverse myelitis.      Acute Transverse Myelitis: The patient's negative MRI lowers the possibility of her symptoms being associated with MS.  Her recent LP will still need to be reviewed but the HSV PCR came back negative, so the etiology is still unclear. Her IgG CSF Index came back normal, and per the neurologist twill now start IGIV therapy.  Plan:  -Day 2 of 3-5 day IVIG therapy - Foley is maintained while the patient has urinary incontinence - PT/OT continued for rehab - q4h neuro-check  Back pain and sciatic: The patient has a hx of back pain and sciatica that is  now well controlled.   Plan: Continue gabapentin and oxy  Constipation: The patient had multiple bowel movements yesterday.   Plan:  Continue mirilax as needed.  Add docusate to keep regular    Steroid induced hyperglycemia: The patient had a BG level of 152 today.  She is showing better control over all.   Plan:  -continue sliding scale insulin to control hypergycemia.   This is a Careers information officer Note.  The care of the patient was discussed with Dr. Lynnae January and the assessment and plan formulated with their assistance.  Please see their attached note for official documentation of the daily encounter.   LOS: 5 days   [name], Medical Student 12/25/2016, 12:46 PM

## 2016-12-25 NOTE — Progress Notes (Signed)
Subjective: Interval History: Not able to walk.  Working with PT to stand a bit.  Unable to feel need to urinate or bowel movement.  Feels numb and weak in the legs.    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  Has had radicular symptoms over that time.    CSF WBC 106 RBC 3550 Protein 36 Glucose 92 IgG index 0.6  OCB pending  CSF HSV 1, 2, VZV negative.  HIV negative.  ANA and all autoimmune serologies negative.    MRI Brain and Cervical spine is normal.  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 Nutritional status: Diet Carb Modified Fluid consistency: Thin; Room service appropriate? Yes  Neurologic Exam:  Awake, alert, oriented. Face symmetric. Tongue midline. Strength 5/5 BUE  And 0/5 BLE. Decreased sensation in the legs. Gait- unable to stand or walk even without assistance.  Reflexes - absent achilles, patella bilaterally and +2 biceps, triceps, brachioradialis.  Lab Results:  Recent Labs  12/24/16 1339  WBC 10.5  HGB 12.1  HCT 38.0  PLT 221  CREATININE 0.73   Lipid Panel No results for input(s): CHOL, TRIG, HDL, CHOLHDL, VLDL, LDLCALC in the last 72 hours.  Studies/Results: No results found.  Medications:  Scheduled: . gabapentin  300 mg Oral TID  . heparin  5,000 Units Subcutaneous Q8H  . insulin aspart  0-15 Units Subcutaneous TID WC  . insulin aspart  0-5 Units Subcutaneous QHS  . insulin aspart   4 Units Subcutaneous TID WC  . insulin glargine  12 Units Subcutaneous QHS  . meloxicam  7.5 mg Oral Daily  . nystatin   Topical TID  . polyethylene glycol  17 g Oral BID    Assessment/Plan:  Transverse myelitis- etiology unclear at this time.  I will add CSF EBV and CMV PCR as other possible etiologies.    This may be the first presentation of MS, although normal IgG index argues against that.  OCB are pending.  Statistically about 30% of TM patients develop clinically definite MS by 5 years after TM, but all those have abnormal CSF.    Given the extensive longitudinal lesion, Devic's syndrome is in the differential.  I will check serum NMO-IgG.    I doubt spinal cord stroke.  DWI not useful with spine imaging per neuroradiologist.  The symptoms were gradual over 4 hours and not sudden onset.  CSF goes against this too.  Continue IVIG for 3 days only.  S/P IV solumedrol for 5 days.   Cont PT/OT and anticipate inpatient rehab.       LOS: 5 days   Rogue Jury, MD 12/25/2016  3:23 PM

## 2016-12-25 NOTE — Progress Notes (Signed)
Occupational Therapy Treatment Patient Details Name: Traci [name] MRN: [id number] DOB: 11/14/1975 Today's Date: 12/25/2016    History of present illness pt is a 41 y/o female with h/o obesity and DDD who presents with sudden onset of bilatteral weakness with gradual degradation of both strength and sensation in L LE before R LE. MRI shows some disc protrusion and mild narrowing at certain levels from cervical to lumbar spine, but MD's feel the presentation does not represent what the images show. LP completed, results not back. MRI brain not abnormal. Work up continues.   OT comments  Pt progressing towards goals, demonstrating great motivation to work with therapy and work towards increasing independence with ADLs and functional mobility. Pt sat EOB during session working on dynamic sitting balance, weight shifting and scooting to EOB with MinA. Sit to stand trials with +2 assist and squat pivot transfer to recliner completed with +2 total assist. Pt will benefit from continued acute OT services and is a great candidate for CIR to optimize safety and independence with ADLs and functional mobility. Will continue to follow.     Follow Up Recommendations  CIR    Equipment Recommendations  3 in 1 bedside commode (will need drop arm bariatric)          Precautions / Restrictions Precautions Precautions: Fall Restrictions Weight Bearing Restrictions: No       Mobility Bed Mobility Overal bed mobility: Needs Assistance Bed Mobility: Supine to Sit     Supine to sit: Mod assist (for LEs)     General bed mobility comments: pt use roll of Upper Body to assist drag of bil LE's to EOB and Min trunk assist up via R elbow; some use of handrail  Transfers Overall transfer level: Needs assistance   Transfers: Sit to/from [employer] Transfers Sit to Stand: Max assist;+2 physical assistance   Squat pivot transfers: Total assist;+2 physical assistance     General transfer comment:  pt assists significantly with UE's, but no assist from LE's     Balance Overall balance assessment: Needs assistance Sitting-balance support: Single extremity supported;Bilateral upper extremity supported Sitting balance-Leahy Scale: Poor Sitting balance - Comments: Pt sat EOB approx 20 min.  Able to maintain safe sitting EOB with therapists not close by.  Safest with 1 UE assist   Standing balance support: Bilateral upper extremity supported Standing balance-Leahy Scale: Zero Standing balance comment: stood x2 with significant 2 person facilitation with support at knees and use of pad.  Pt unable to get her trunk upright.                           ADL either performed or assessed with clinical judgement   ADL Overall ADL's : Needs assistance/impaired Eating/Feeding: Sitting;Set up   Grooming: Wash/dry face;Set up;Sitting Grooming Details (indicate cue type and reason): supported sitting, increased assist for unsupported              Lower Body Dressing: Total assistance;Bed level Lower Body Dressing Details (indicate cue type and reason): donning socks     Toileting- Clothing Manipulation and Hygiene: Total assistance;Bed level Toileting - Clothing Manipulation Details (indicate cue type and reason): foley     Functional mobility during ADLs: Maximal assistance;+2 for physical assistance;+2 for safety/equipment General ADL Comments: Focus of session on bed mobility and functional mobility transfers, education provided to Pt on pressure relief strategies while sitting up in chair  Cognition Arousal/Alertness: Awake/alert Behavior During Therapy: WFL for tasks assessed/performed Overall Cognitive Status: Within Functional Limits for tasks assessed                                                      General Comments MD witnessed sitting EOB, pt with no lower reflexes    Pertinent Vitals/ Pain       Pain  Assessment: No/denies pain                                                          Frequency  Min 2X/week        Progress Toward Goals  OT Goals(current goals can now be found in the care plan section)  Progress towards OT goals: Progressing toward goals  Acute Rehab OT Goals Patient Stated Goal: walk OT Goal Formulation: With patient Time For Goal Achievement: 01/07/17 Potential to Achieve Goals: Good  Plan Discharge plan remains appropriate    Co-evaluation    PT/OT/SLP Co-Evaluation/Treatment: Yes Reason for Co-Treatment: Complexity of the patient's impairments (multi-system involvement);For patient/therapist safety PT goals addressed during session: Mobility/safety with mobility OT goals addressed during session: ADL's and self-care      AM-PAC PT "6 Clicks" Daily Activity     Outcome Measure   Help from another person eating meals?: None Help from another person taking care of personal grooming?: A Little Help from another person toileting, which includes using toliet, bedpan, or urinal?: Total Help from another person bathing (including washing, rinsing, drying)?: A Lot Help from another person to put on and taking off regular upper body clothing?: A Little Help from another person to put on and taking off regular lower body clothing?: Total 6 Click Score: 14    End of Session Equipment Utilized During Treatment: Gait belt  OT Visit Diagnosis: Muscle weakness (generalized) (M62.81);Other symptoms and signs involving the nervous system (R29.898)   Activity Tolerance Patient tolerated treatment well   Patient Left in chair;with call bell/phone within reach   Nurse Communication Mobility status;Need for lift equipment        Time: 7001-7494 OT Time Calculation (min): 55 min  Charges: OT Treatments $Therapeutic Activity: 23-37 mins  [name], [place] Pager [phone] 12/25/2016    [name] 12/25/2016, 5:18  PM

## 2016-12-26 ENCOUNTER — Encounter (HOSPITAL_COMMUNITY): Payer: Self-pay | Admitting: *Deleted

## 2016-12-26 LAB — GLUCOSE, CAPILLARY
GLUCOSE-CAPILLARY: 111 mg/dL — AB (ref 65–99)
GLUCOSE-CAPILLARY: 103 mg/dL — AB (ref 65–99)
Glucose-Capillary: 118 mg/dL — ABNORMAL HIGH (ref 65–99)
Glucose-Capillary: 126 mg/dL — ABNORMAL HIGH (ref 65–99)

## 2016-12-26 LAB — HEMOGLOBIN A1C
Hgb A1c MFr Bld: 7.3 % — ABNORMAL HIGH (ref 4.8–5.6)
Mean Plasma Glucose: 163 mg/dL

## 2016-12-26 MED ORDER — IMMUNE GLOBULIN (HUMAN) 20 GM/200ML IV SOLN
400.0000 mg/kg | Freq: Once | INTRAVENOUS | Status: AC
  Administered 2016-12-26: 50 g via INTRAVENOUS
  Filled 2016-12-26: qty 100

## 2016-12-26 NOTE — Progress Notes (Signed)
Subjective:  Traci [name].  She reports no new changes in her movement or sensation.  She has had multiple bowel movements.  She does not report any shortness of breath or vision changes.  Her lgucose is now at 111 and well controlled. PT/OT came and worked on sitting up with the patient, which she reports is tiring but good.   She is on day 3 of her IVIG treatment.    We discussed neurology's plan to test for EBV, CMV, and Devic antibodies.    Objective: Vital signs in last 24 hours: Vitals:   12/25/16 2110 12/26/16 0146 12/26/16 0622 12/26/16 0903  BP: 135/75 118/85 (!) 143/77 (!) 141/88  Pulse: 68 (!) 59 67 69  Resp: 18 18 18 16   Temp: 98.3 F (36.8 C) 97.9 F (36.6 C) 98.3 F (36.8 C) 99 F (37.2 C)  TempSrc: Oral Oral Oral Oral  SpO2: 98% 97% 94% 95%  Weight:      Height:        Intake/Output Summary (Last 24 hours) at 12/26/16 1119 Last data filed at 12/26/16 8099  Gross per 24 hour  Intake              240 ml  Output             2350 ml  Net            -2110 ml    General appearance: cooperative, no distress and moderately obese Lungs: clear to auscultation bilaterally Heart: regular rate and rhythm, S1, S2 normal, no murmur, click, rub or gallop Neurologic: Mental status: Alert, oriented, thought content appropriate Cranial nerves: normal Sensory: Low sensation to temperature in both legs, substantial loss of sensation to light touch bilaterally with left foot slightly better than the right Motor: 0/5 BLE, normal otherwise   Lab Results:   CSF Results: Appearance-- Pink and cloudy RBC -- 3550 WBC --106 Protein -- 36 Glucose -- 92 Oligoclonal bands -- Zero IgG -- Normal IgA -- Normal HSV PCR -- Negative VZV PCR-- negative Pathologic Smear Review -- no malignant cells identified  Neuromyelitis optica Ab test -- processing EBV PCR -- processing CMVPCR -- processing  IgG CSF index  Order: [id number]  Status:  Final result Visible to patient:  No (Not  Released) Next appt:  None   Ref Range & Units 2d ago  IgG, CSF 0.0 - 8.6 mg/dL 3.3   Albumin CSF-mCnc 11 - 48 mg/dL 22   IgG (Immunoglobin G), Serum 700 - 1,600 mg/dL 910   Albumin 3.5 - 5.5 g/dL 3.6   IgG/Alb Ratio, CSF 0.00 - 0.25 0.15   CSF IgG Index 0.0 - 0.7 0.6          Studies/Results: MRI's significant for demyelination in the thoracic segment with no demyelination in the cervical or head MRI.  Impression "Multiple sclerosis is unlikely in the absence of brain lesions. 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 Scheduled Meds: . gabapentin  300 mg Oral TID  . heparin  5,000 Units Subcutaneous Q8H  . Immune Globulin 10%  400 mg/kg Intravenous Once  . insulin aspart  0-15 Units Subcutaneous TID WC  . insulin aspart  0-5 Units Subcutaneous QHS  . insulin aspart  4 Units Subcutaneous TID WC  . insulin glargine  12 Units Subcutaneous QHS  . meloxicam  7.5 mg Oral Daily  . nystatin   Topical TID  .  polyethylene glycol  17 g Oral BID   Continuous Infusions:  PRN Meds:.acetaminophen, oxyCODONE-acetaminophen Assessment/Plan: Principal Problem:   Transverse myelitis (HCC) Active Problems:   Steroid-induced hyperglycemia  Traci [name] is a 41 yo woman with a PMHx significant for degenerative disc disease with paraplegia but improving sensation in her legs, most likely associated with transverse myelitis.      Acute Transverse Myelitis: The patient continues to report no changes in symptoms and is on day 3 of IVIG therapy.  Neurology will be testing for CMV, EBV and devic Abs.   Plan:  -Day 3 of 3-5 day IVIG therapy - Foley is maintained while the patient has urinary incontinence - PT/OT continued for rehab - q4h neuro-check  Back pain and sciatic: The patient has a hx of back pain and sciatica that is now well controlled.   Plan:  Continue gabapentin and oxy  Constipation: The patient had multiple bowel movements yesterday.   Plan:  Continue mirilax as needed.  Add docusate to keep regular    Steroid induced hyperglycemia: The patient had a BG level of 111 today.  She is showing better control over all, and the hyperglycemia was likely completely due to her steroid therapy, although that does not explain her A1C. Plan:  -continue sliding scale insulin to control hypergycemia.  -check A1C in 3 months  This is a Careers information officer Note.  The care of the patient was discussed with Dr. Lynnae January and the assessment and plan formulated with their assistance.  Please see their attached note for official documentation of the daily encounter.   LOS: 6 days   Traci [name], Medical Student 12/26/2016, 11:19 AM

## 2016-12-26 NOTE — Progress Notes (Signed)
Subjective: Interval History:  No major change in leg strength or sensation.  Cannot stand without assist.  No walking.  Urinary and Bowel incontinence.  Today is day #3 of IVIG.   Objective: Vital signs in last 24 hours: Temp:  [97.9 F (36.6 C)-99 F (37.2 C)] 99 F (37.2 C) (05/12 0903) Pulse Rate:  [59-77] 69 (05/12 0903) Resp:  [16-18] 16 (05/12 0903) BP: (109-155)/(62-88) 141/88 (05/12 0903) SpO2:  [94 %-100 %] 95 % (05/12 0903)  Intake/Output from previous day: 05/11 0701 - 05/12 0700 In: 240 [P.O.:240] Out: 2350 [Urine:2350] Intake/Output this shift: No intake/output data recorded. Nutritional status: Diet Carb Modified Fluid consistency: Thin; Room service appropriate? Yes  Neurologic Exam:  Lab Results:  Recent Labs  12/24/16 1339  WBC 10.5  HGB 12.1  HCT 38.0  PLT 221  CREATININE 0.73   Lipid Panel No results for input(s): CHOL, TRIG, HDL, CHOLHDL, VLDL, LDLCALC in the last 72 hours.  Studies/Results: No results found.  Medications:  Scheduled: . gabapentin  300 mg Oral TID  . heparin  5,000 Units Subcutaneous Q8H  . insulin aspart  0-15 Units Subcutaneous TID WC  . insulin aspart  0-5 Units Subcutaneous QHS  . insulin aspart  4 Units Subcutaneous TID WC  . insulin glargine  12 Units Subcutaneous QHS  . meloxicam  7.5 mg Oral Daily  . nystatin   Topical TID  . polyethylene glycol  17 g Oral BID    Assessment/Plan:  Transverse myelitis- etiology unclear at this time.   CSF Oligoclonal bands were negative.  Therefore, MS cannot be diagnosed at this time. This may be the first presentation of MS,  one study found that about 30% of TM patients develop clinically definite MS by 5 years after TM, but all those have abnormal CSF at presentation.  Thus, there is a higher likelihood that this will be a one time event for her.   CSF EBV and CMV PCR are pending.    Given the extensive longitudinal lesion, Devic's syndrome is in the differential.  Serum  NMO-IgG is pending.  She has never had optic neuritis to this date.    Continue IVIG for 5 days total.   S/P IV solumedrol for 5 days.   Cont PT/OT and anticipate inpatient rehab.    LOS: 6 days   Rogue Jury, MD 12/26/2016  9:37 AM

## 2016-12-26 NOTE — Progress Notes (Signed)
   Subjective: Currently, the patient feels well. Having regular BMs. No change in LE symptoms.  Objective: Vital signs in last 24 hours: Vitals:   12/26/16 0146 12/26/16 0622 12/26/16 0903 12/26/16 1154  BP: 118/85 (!) 143/77 (!) 141/88 (!) 152/87  Pulse: (!) 59 67 69 74  Resp: 18 18 16 14   Temp: 97.9 F (36.6 C) 98.3 F (36.8 C) 99 F (37.2 C) 99.3 F (37.4 C)  TempSrc: Oral Oral Oral Oral  SpO2: 97% 94% 95% 98%  Weight:      Height:       Intake/Output:  05/11 0701 - 05/12 0700 In: 240 [P.O.:240] Out: 2350 [Urine:2350]    Physical Exam: Physical Exam  Constitutional: She is oriented to person, place, and time. She appears well-developed and well-nourished. She is cooperative. No distress.  Eyes: EOM are normal.  Neck: Normal range of motion.  Cardiovascular: Normal rate, regular rhythm, intact distal pulses and normal pulses.   Pulmonary/Chest: Effort normal. No respiratory distress. Breasts are symmetrical.  Anterior lung fields clear to auscultation.   Abdominal: Soft. Bowel sounds are normal. There is no tenderness.  Musculoskeletal: She exhibits no edema.  Neurological: She is alert and oriented to person, place, and time.  Full paraplegia below the hips. Some sensation to light touch bilat above the knee. No improvement in sensation to light touch below the knee (L>R).   Skin: Skin is warm and dry.   Medications: Infusions:  Scheduled Medications: . gabapentin  300 mg Oral TID  . heparin  5,000 Units Subcutaneous Q8H  . Immune Globulin 10%  400 mg/kg Intravenous Once  . insulin aspart  0-15 Units Subcutaneous TID WC  . insulin aspart  0-5 Units Subcutaneous QHS  . insulin aspart  4 Units Subcutaneous TID WC  . insulin glargine  12 Units Subcutaneous QHS  . meloxicam  7.5 mg Oral Daily  . nystatin   Topical TID  . polyethylene glycol  17 g Oral BID   PRN Medications: acetaminophen, oxyCODONE-acetaminophen  Assessment/Plan: Pt is a 41 y.o. yo female  with a PMHx of DDD who was admitted on 12/20/2016 with symptoms of acute onset bilateral LE weakness and numbness, which was determined to be secondary to transverse myelitis. Diagnostic w/u negative so far.  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 Although CSF studies negative, MS cannot be completely ruled out per neurology. Devic's syndrome is also on the differential.  -Appreciate neurology recs  - IV IgG x 5 days (today is day 3) - maintain foley for neurogenic bladder - PT/OT - q4h neuro-check - Pending neuromyelitis optica IgG, CMV PCR, EBV PCR - For back pain: oxy-APAP 5-325mg   q4 prn+ gabapentin 300mg  TID  2) Steroid induced hyperglycemia (resolved): CBGs in 100s today. A1c 6.9.  -Lantus 12U qHS + Novolog 4U TID + SSI-r TID qAC/HS  Length of Stay: 6 day(s) Dispo: PT/OT recommend CIR, pt will be a good candidate regardless of improvement with LE motor function, but will hold off on CIR c/s until prognosis regarding LE function is more certain.  Shela Leff, MD Pager: [phone] (7AM-5PM) 12/26/2016, 12:08 PM

## 2016-12-27 LAB — GLUCOSE, CAPILLARY
Glucose-Capillary: 131 mg/dL — ABNORMAL HIGH (ref 65–99)
Glucose-Capillary: 125 mg/dL — ABNORMAL HIGH (ref 65–99)
Glucose-Capillary: 116 mg/dL — ABNORMAL HIGH (ref 65–99)
Glucose-Capillary: 130 mg/dL — ABNORMAL HIGH (ref 65–99)

## 2016-12-27 MED ORDER — IMMUNE GLOBULIN (HUMAN) 10 GM/100ML IV SOLN
400.0000 mg/kg | INTRAVENOUS | Status: AC
  Administered 2016-12-27 – 2016-12-28 (×2): 50 g via INTRAVENOUS
  Filled 2016-12-27 (×2): qty 100

## 2016-12-27 NOTE — Progress Notes (Signed)
Subjective: Interval History: Day #4 of IVIG - some touch sensation back in the legs. No other changes.   Objective: Vital signs in last 24 hours: Temp:  [98 F (36.7 C)-99.1 F (37.3 C)] 98 F (36.7 C) (05/13 1203) Pulse Rate:  [71-88] 85 (05/13 1203) Resp:  [14-18] 16 (05/13 1203) BP: (118-137)/(47-88) 131/88 (05/13 1203) SpO2:  [96 %-99 %] 98 % (05/13 1203)  Intake/Output from previous day: 05/12 0701 - 05/13 0700 In: -  Out: 3400 [Urine:3400] Intake/Output this shift: No intake/output data recorded. Nutritional status: Diet Carb Modified Fluid consistency: Thin; Room service appropriate? Yes  Neurologic Exam:  Awake, alert, fully oriented. Language normal. BUE 5/5 BLE 0/5 Bowel incontinence Urine- foley but no feeling that bladder full Sensation- some light touch intact in legs, but no pain sensation No babinski. Areflexic.  Gait- unable to stand or walk independently  Lab Results:  Recent Labs  12/24/16 1339  WBC 10.5  HGB 12.1  HCT 38.0  PLT 221  CREATININE 0.73   Lipid Panel No results for input(s): CHOL, TRIG, HDL, CHOLHDL, VLDL, LDLCALC in the last 72 hours.  Studies/Results: No results found.  Medications:  Scheduled: . gabapentin  300 mg Oral TID  . heparin  5,000 Units Subcutaneous Q8H  . Immune Globulin 10%  400 mg/kg Intravenous Q24 Hr x 2  . insulin aspart  0-15 Units Subcutaneous TID WC  . insulin aspart  0-5 Units Subcutaneous QHS  . meloxicam  7.5 mg Oral Daily  . nystatin   Topical TID  . polyethylene glycol  17 g Oral BID    Assessment/Plan:  Transverse myelitis- etiology unclear at this time.   Possible Clinically isolated syndrome of MS, but CSF argues against that at this time.  CSF EBV and CMV PCR are pending.    Given the extensive longitudinal lesion, Devic's syndrome is in the differential. Serum NMO-IgG is pending.  She has never had optic neuritis to this date.    Continue IVIG for 5 days total.  S/P IV  solumedrol for 5 days.   Cont PT/OT and anticipate inpatient rehab.    LOS: 7 days   Rogue Jury, MD 12/27/2016  12:37 PM

## 2016-12-27 NOTE — Progress Notes (Signed)
   Subjective: Currently, the patient feels well. Having regular BMs. States her legs feel "heavy" now. No other complaints.   Objective: Vital signs in last 24 hours: Vitals:   12/26/16 1645 12/26/16 2152 12/27/16 0109 12/27/16 0527  BP: 137/72 (!) 132/47 (!) 135/56 (!) 120/57  Pulse: 73 75 77 75  Resp: 16 18 18 18   Temp: 99.1 F (37.3 C) 99.1 F (37.3 C) 98.7 F (37.1 C) 98.9 F (37.2 C)  TempSrc: Oral Oral Oral Oral  SpO2: 97% 99% 99% 98%  Weight:      Height:       Intake/Output:  05/12 0701 - 05/13 0700 In: -  Out: 3400 [Urine:3400]    Physical Exam: Physical Exam  Constitutional: She is oriented to person, place, and time. She appears well-developed and well-nourished. She is cooperative. No distress.  Eyes: EOM are normal.  Neck: Normal range of motion.  Cardiovascular: Normal rate, regular rhythm, intact distal pulses and normal pulses.   Pulmonary/Chest: Effort normal. No respiratory distress. Breasts are symmetrical.  Anterior lung fields clear to auscultation.   Abdominal: Soft. Bowel sounds are normal. There is no tenderness.  Musculoskeletal: She exhibits no edema.  Neurological: She is alert and oriented to person, place, and time.  Full paraplegia below the hips. Some sensation to light touch bilat above the knee (same as yesterday). Today she has some sensation to light touch below the knee bilaterally (yesterday she had sensation only on the L side).    Skin: Skin is warm and dry.   Medications: Infusions:  Scheduled Medications: . gabapentin  300 mg Oral TID  . heparin  5,000 Units Subcutaneous Q8H  . insulin aspart  0-15 Units Subcutaneous TID WC  . insulin aspart  0-5 Units Subcutaneous QHS  . insulin aspart  4 Units Subcutaneous TID WC  . insulin glargine  12 Units Subcutaneous QHS  . meloxicam  7.5 mg Oral Daily  . nystatin   Topical TID  . polyethylene glycol  17 g Oral BID   PRN Medications: acetaminophen,  oxyCODONE-acetaminophen  Assessment/Plan: Pt is a 41 y.o. 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 Diagnostic w/u negative so far.  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 Although CSF studies negative, MS cannot be completely ruled out per neurology. Devic's syndrome is also on the differential.  -Appreciate neurology recs  - IV IgG x 5 days (today is day 4) - maintain foley for neurogenic bladder - PT/OT - q4h neuro-checks - Pending neuromyelitis optica IgG, CMV PCR, EBV PCR - For back pain: oxy-APAP 5-325mg   q4 prn+ gabapentin 300mg  TID  2) Steroid induced hyperglycemia (resolved): CBGs in 100s for the past 2 days. Last dose of methylprednisolone was on 5/10. Initial A1c 6.9 during this hospitalization.  -Discontinue basal insulin  -Discontinue scheduled pre-preprandial insulin  -Continue SSI-M TID qAC/HS -She will need repeat A1c testing on an outpatient basis   Dispo: PT/OT recommend CIR, pt will be a good candidate regardless of improvement with LE motor function, but will hold off on CIR c/s until prognosis regarding LE function is more certain.  Shela Leff, MD Pager: [phone] (7AM-5PM) 12/27/2016, 10:02 AM

## 2016-12-28 DIAGNOSIS — G379 Demyelinating disease of central nervous system, unspecified: Secondary | ICD-10-CM

## 2016-12-28 DIAGNOSIS — R202 Paresthesia of skin: Secondary | ICD-10-CM

## 2016-12-28 DIAGNOSIS — G373 Acute transverse myelitis in demyelinating disease of central nervous system: Principal | ICD-10-CM

## 2016-12-28 DIAGNOSIS — E1169 Type 2 diabetes mellitus with other specified complication: Secondary | ICD-10-CM

## 2016-12-28 DIAGNOSIS — E669 Obesity, unspecified: Secondary | ICD-10-CM

## 2016-12-28 DIAGNOSIS — R29898 Other symptoms and signs involving the musculoskeletal system: Secondary | ICD-10-CM

## 2016-12-28 DIAGNOSIS — G0491 Myelitis, unspecified: Secondary | ICD-10-CM

## 2016-12-28 DIAGNOSIS — M48061 Spinal stenosis, lumbar region without neurogenic claudication: Secondary | ICD-10-CM

## 2016-12-28 DIAGNOSIS — M5137 Other intervertebral disc degeneration, lumbosacral region: Secondary | ICD-10-CM

## 2016-12-28 DIAGNOSIS — M51379 Other intervertebral disc degeneration, lumbosacral region without mention of lumbar back pain or lower extremity pain: Secondary | ICD-10-CM

## 2016-12-28 DIAGNOSIS — G8220 Paraplegia, unspecified: Secondary | ICD-10-CM

## 2016-12-28 LAB — GLUCOSE, CAPILLARY
GLUCOSE-CAPILLARY: 145 mg/dL — AB (ref 65–99)
Glucose-Capillary: 140 mg/dL — ABNORMAL HIGH (ref 65–99)
Glucose-Capillary: 108 mg/dL — ABNORMAL HIGH (ref 65–99)
Glucose-Capillary: 125 mg/dL — ABNORMAL HIGH (ref 65–99)

## 2016-12-28 LAB — NEUROMYELITIS OPTICA AUTOAB, IGG: NMO-IgG: <1.5 U/mL (ref 0.0–3.0)

## 2016-12-28 LAB — EPSTEIN BARR VRS(EBV DNA BY PCR)
EBV DNA QN BY PCR: NEGATIVE {copies}/mL
log10 EBV DNA Qn PCR: UNDETERMINED log10 copy/mL

## 2016-12-28 NOTE — Progress Notes (Signed)
Occupational Therapy Treatment Patient Details Name: Traci [name] MRN: [id number] DOB: 1976-03-01 Today's Date: 12/28/2016    History of present illness Pt is a 41 y/o female with h/o obesity and DDD who presents with sudden onset of bilateral weakness with gradual degradation of both strength and sensation in L LE before R LE. MRI shows extensive thoracic lesion, but with no other CNS involvement. Pt currently being treated for transverse myelitis.    OT comments  Pt continues to remain very motivated and progress towards goals. Focus of session on BUE strengthening in preparation for lateral functional mobility transfers. Pt requires support to complete seated HEP (HOB raised to sitting position), is able to achieve sitting up from supine in bed however requires BUE support to maintain sitting balance if back is unsupported. Pt requires +2 assist for further functional mobility transfers. Pt will benefit from continued OT services to maximize safety and independence for completing ADLs and functional mobility. Will continue to follow.    Follow Up Recommendations  CIR    Equipment Recommendations  3 in 1 bedside commode (drop arm bariatric BSC )          Precautions / Restrictions Precautions Precautions: Fall Restrictions Weight Bearing Restrictions: No       Mobility Bed Mobility Overal bed mobility: Needs Assistance Bed Mobility: Supine to Sit;Sit to Supine     Supine to sit: Mod assist Sit to supine: Mod assist   General bed mobility comments: Pt able to achieve sitting up in bed using BUEs for support. Deferred EOB sitting this session to focus on BUE HEP and Pt requires supported sitting to complete (HOB elevated to sitting position)   Transfers Overall transfer level: Needs assistance   Transfers: Sit to/from Stand Sit to Stand: Max assist;+2 physical assistance         General transfer comment: OOB mobility deferred this session, focus of session on BUE  strengthening/HEP     Balance Overall balance assessment: Needs assistance Sitting-balance support: Single extremity supported;Bilateral upper extremity supported Sitting balance-Leahy Scale: Poor Sitting balance - Comments: Pt requires BUE to maintain sitting balance Postural control: Posterior lean Standing balance support: Bilateral upper extremity supported Standing balance-Leahy Scale: Zero Standing balance comment: stood x2 with significant 2 person facilitation with support at knees and use of pad.  Pt unable to get her trunk upright.                           ADL either performed or assessed with clinical judgement   ADL Overall ADL's : Needs assistance/impaired Eating/Feeding: Sitting;Set up   Grooming: Wash/dry face;Set up;Bed level                       Toileting- Clothing Manipulation and Hygiene: Total assistance;Bed level Toileting - Clothing Manipulation Details (indicate cue type and reason): foley       General ADL Comments: Pt completed bed level grooming ADLs, session included BUE HEP                       Cognition Arousal/Alertness: Awake/alert Behavior During Therapy: WFL for tasks assessed/performed Overall Cognitive Status: Within Functional Limits for tasks assessed                                 General Comments: Pt tearful towards end of session, when speaking of  her family; family is not local, though Pt does have support from roommate locally         Exercises Shoulder Exercises Shoulder Flexion: AROM;Strengthening;Both;10 reps;Theraband;Seated Theraband Level (Shoulder Flexion): Level 1 (Yellow) Shoulder ABduction: AROM;Strengthening;Both;10 reps;Theraband;Seated Shoulder External Rotation: AROM;Strengthening;Both;10 reps;Seated;Theraband Theraband Level (Shoulder External Rotation): Level 1 (Yellow) Elbow Flexion: AROM;Both;Strengthening;Theraband;Seated Theraband Level (Elbow Flexion): Level 1  (Yellow) Elbow Extension: AROM;Both;Strengthening;10 reps;Theraband Theraband Level (Elbow Extension): Level 1 (Yellow)          General Comments      Pertinent Vitals/ Pain       Pain Assessment: No/denies pain                                            Prior Functioning/Environment              Frequency  Min 2X/week        Progress Toward Goals  OT Goals(current goals can now be found in the care plan section)  Progress towards OT goals: Progressing toward goals  Acute Rehab OT Goals Patient Stated Goal: walk OT Goal Formulation: With patient Time For Goal Achievement: 01/07/17 Potential to Achieve Goals: Good  Plan Discharge plan remains appropriate                     AM-PAC PT "6 Clicks" Daily Activity     Outcome Measure   Help from another person eating meals?: None Help from another person taking care of personal grooming?: A Little Help from another person toileting, which includes using toliet, bedpan, or urinal?: Total Help from another person bathing (including washing, rinsing, drying)?: A Lot Help from another person to put on and taking off regular upper body clothing?: A Little Help from another person to put on and taking off regular lower body clothing?: Total 6 Click Score: 14    End of Session    OT Visit Diagnosis: Muscle weakness (generalized) (M62.81);Other symptoms and signs involving the nervous system (R29.898)   Activity Tolerance Patient tolerated treatment well   Patient Left in bed;with call bell/phone within reach             Time: 1315-1337 OT Time Calculation (min): 22 min  Charges: OT General Charges $OT Visit: 1 Procedure OT Treatments $Therapeutic Exercise: 8-22 mins  [name], OT Pager [phone] 12/28/2016    [name] 12/28/2016, 3:28 PM

## 2016-12-28 NOTE — Progress Notes (Addendum)
   Subjective: Currently, the patient is sleepy, but w/o complaints. Having regular BMs, appetite good. No change in LE function or sensation.  Objective: Vital signs in last 24 hours: Vitals:   12/27/16 1709 12/27/16 1956 12/28/16 0037 12/28/16 0422  BP: (!) 149/72 (!) 123/54 (!) 142/81 136/75  Pulse: 79 87 86 84  Resp: 14 17 18 18   Temp: 99 F (37.2 C) 98.9 F (37.2 C) 99.2 F (37.3 C) 98.5 F (36.9 C)  TempSrc: Oral Oral Oral Oral  SpO2: 100% 99% 97% 98%  Weight:      Height:       Physical Exam: Physical Exam  Constitutional: She is oriented to person, place, and time. She appears well-developed and well-nourished. She is cooperative. No distress.  Eyes: EOM are normal.  Neck: Normal range of motion.  Cardiovascular: Normal rate, regular rhythm, intact distal pulses and normal pulses.   Pulmonary/Chest: Effort normal. No respiratory distress. Breasts are symmetrical.  Anterior lung fields clear to auscultation.   Abdominal: Soft. Bowel sounds are normal. There is no tenderness.  Musculoskeletal: She exhibits no edema.  Neurological: She is alert and oriented to person, place, and time.  Full paraplegia below the hips. Sensation to light touch L>R, sensation to cold temp R>L, no pain sensation BLE.   Skin: Skin is warm and dry.   Medications: Scheduled Medications: . gabapentin  300 mg Oral TID  . heparin  5,000 Units Subcutaneous Q8H  . Immune Globulin 10%  400 mg/kg Intravenous Q24 Hr x 2  . insulin aspart  0-15 Units Subcutaneous TID WC  . insulin aspart  0-5 Units Subcutaneous QHS  . meloxicam  7.5 mg Oral Daily  . nystatin   Topical TID  . polyethylene glycol  17 g Oral BID   PRN Medications: acetaminophen, oxyCODONE-acetaminophen  Assessment/Plan: Pt is a 41 y.o. 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 Diagnostic  w/u negative so far.  1) Transverse myelitis: MRI w/ extensive thoracic lesion, no other CNS involvement. CSF less convincing for MS. NMO/Devic's remains on the differential despite no optic neuritis. Pending neuromyelitis optica IgG, CMV PCR, EBV PCR. Continue to appreciate Neurology assistance. -Appreciate neurology recs  - IV IgG x 5 days (today is day 5) - maintain foley for neurogenic bladder - PT/OT - q4h neuro-checks - For back pain: oxy-APAP 5-325mg   q4 prn+ gabapentin 300mg  TID  Dispo: CIR c/s  Holley Raring, MD Pager: [phone] (7AM-5PM) 12/28/2016, 7:01 AM
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
## 2016-12-28 NOTE — [place] Notes (Signed)
I met with pt at bedside to begin discussions concerning a possible inpt rehab admission pending insurance approval. She is in agreement, I will begin insurance authorization and follow up tomorrow pending their approval. [phone]

## 2016-12-28 NOTE — Consult Note (Signed)
Physical Medicine and Rehabilitation Consult  Reason for Consult: Transverse myelitis Referring Physician:  Dr. Lynnae January   HPI: Traci [name] is a 41 y.o. female with history of morbid obesity, DDD with reports of left sciatica over past couple of months and one week history of pain LLE with numbness tingling. She developed sudden onset of LLE weakness spreading to RLE with burning and BLE instability. She was admitted on 12/20/16 with BLE numbness, band like pain in bilateral lumbar region, urinary incontinence, and reports of fevers. MRI Tspine reviewed, showing increased signal T-spine and distal.  Per report, hyperintense signal abnormality within mid thoracic cord to conus medullaris concerning for demyelination, moderate L4-5 disc extrusion with mild canal stenosis affecting L5 nerve, moderate right L4/5 neural foraminal narrowing. MRI brain without white matter lesion or abnormal enhancement. She was found to have decreased rectal tone as well as urinary retention. Neurology felt that symptoms due to transverse myelitis with appearance most consistent with inflammatory v/s viral and patient treated with IV solumedrol X 5 days as well as empiric IV acyclovir.  LP done showing elevated WBC 106 and glucose 92. CSF negative for oligoclonal bands.  Dr. Arnoldo Morale consulted for input and felt that LLE pain due to L4/5 moderate stenosis and that this could be addressed in future if necessary. She had minimal improvement with steroids and treated with IVIG X 5 days. Serum NMO-IgG, EBV PCR and CMV PCR pending. 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 She has had some improvement in sensation BLE but continues to have paraplegia with substantial decline in mobility as well as ability to carry out ADL tasks. She is able to sit at EOB for 20 minutes and stand with significant assist. Therapy ongoing and CIR recommended for follow up therapy.   Review of Systems  Constitutional:  Negative for chills and fever.  HENT: Negative for hearing loss and tinnitus.   Eyes: Negative for blurred vision and double vision.  Respiratory: Negative for cough and shortness of breath.   Cardiovascular: Negative for chest pain, palpitations and leg swelling.  Gastrointestinal: Positive for nausea. Negative for abdominal pain and heartburn.  Genitourinary: Positive for frequency (prior to admission). Negative for dysuria.  Musculoskeletal: Positive for myalgias.  Skin: Negative for itching and rash.  Neurological: Positive for dizziness (when up in chair), tingling, sensory change, focal weakness, weakness and headaches.  Psychiatric/Behavioral: Negative for depression and memory loss. The patient is not nervous/anxious.   All other systems reviewed and are negative.     Past Medical History:  Diagnosis Date  . H/O degenerative disc disease   . Obesity     Past Surgical History:  Procedure Laterality Date  . fibroid tumor    . KNEE ARTHROSCOPY    . TUBAL LIGATION      History reviewed. No pertinent family history of similar symptoms.    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 She lives with a roommate (and her daughter).  Family lives OOT and will not be able to help after discharge. She reports that she has never smoked. She has never used smokeless tobacco. She reports that she does not drink alcohol or use drugs.    Allergies: No Known Allergies    Medications Prior to Admission  Medication Sig Dispense Refill  . cyclobenzaprine (FLEXERIL) 10 MG tablet Take 1 tablet (10 mg total) by mouth 3 (three) times daily as needed for muscle spasms. 30 tablet 0  .  gabapentin (NEURONTIN) 100 MG capsule Can start with one at bedtime and increase to 3 times per day over next 1 week as tolerated. 30 capsule 0  . meloxicam (MOBIC) 7.5 MG tablet Take 1 tablet (7.5 mg total) by mouth daily. 30 tablet 0    Home: Home Living Family/patient expects  to be discharged to:: Private residence Living Arrangements: Non-relatives/Friends Available Help at Discharge: Friend(s), Family, Available PRN/intermittently Type of Home: House Home Access: Stairs to enter [employer] of Steps: about 3 Entrance Stairs-Rails: Right, Left Home Layout: Two level, Able to live on main level with bedroom/bathroom Alternate Level Stairs-Number of Steps: 17-20 Alternate Level Stairs-Rails: Left Bathroom Shower/Tub: Tub/shower unit, Architectural technologist: Standard Bathroom Accessibility: Yes Home Equipment: None  Functional History: Prior Function Level of Independence: Independent Comments: Pt works as a Training and development officer at [employer], was driving  Functional Status:  Mobility: Bed Mobility Overal bed mobility: Needs Assistance Bed Mobility: Supine to Sit Supine to sit: Mod assist (for LEs) Sit to supine: Min guard, HOB elevated General bed mobility comments: pt use roll of Upper Body to assist drag of bil LE's to EOB and Min trunk assist up via R elbow; some use of handrail Transfers Overall transfer level: Needs assistance Transfers: Sit to/from Stand, Squat Pivot Transfers Sit to Stand: Max assist, +2 physical assistance Squat pivot transfers: Total assist, +2 physical assistance General transfer comment: pt assists significantly with UE's, but no assist from LE's  Ambulation/Gait General Gait Details: not able    ADL: ADL Overall ADL's : Needs assistance/impaired Eating/Feeding: Sitting, Set up Grooming: Wash/dry face, Set up, Sitting Grooming Details (indicate cue type and reason): supported sitting, increased assist for unsupported  Upper Body Bathing: Min guard, Sitting Upper Body Bathing Details (indicate cue type and reason): supported sitting, increased assist for unsupported  Lower Body Bathing: Moderate assistance, Bed level Upper Body Dressing : Min guard, Sitting Upper Body Dressing Details (indicate cue type and  reason): supported sitting, increased assist for unsupported  Lower Body Dressing: Total assistance, Bed level Lower Body Dressing Details (indicate cue type and reason): donning socks Toilet Transfer: Total assistance Toileting- Clothing Manipulation and Hygiene: Total assistance, Bed level Toileting - Clothing Manipulation Details (indicate cue type and reason): foley Functional mobility during ADLs: Maximal assistance, +2 for physical assistance, +2 for safety/equipment General ADL Comments: Focus of session on bed mobility and functional mobility transfers, education provided to Pt on pressure relief strategies while sitting up in chair   Cognition: Cognition Overall Cognitive Status: Within Functional Limits for tasks assessed Orientation Level: Oriented X4 Cognition Arousal/Alertness: Awake/alert Behavior During Therapy: WFL for tasks assessed/performed Overall Cognitive Status: Within Functional Limits for tasks assessed  Blood pressure 136/75, pulse 84, temperature 98.5 F (36.9 C), temperature source Oral, resp. rate 18, height 5' (1.524 m), weight 131.7 kg (290 lb 6.4 oz), SpO2 98 %. Physical Exam  Nursing note and vitals reviewed. Constitutional: She is oriented to person, place, and time. She appears well-developed. No distress.  Obese  HENT:  Head: Normocephalic and atraumatic.  Mouth/Throat: Oropharynx is clear and moist.  Eyes: Conjunctivae are normal. Pupils are equal, round, and reactive to light.  Horizontal nystagmus to left field with dysconjugate gaze left eye on confrontation.   Neck: Normal range of motion. Neck supple.  Cardiovascular: Normal rate and regular rhythm.   Respiratory: Effort normal and breath sounds normal. No stridor. No respiratory distress. She has no wheezes.  GI: Soft. Bowel sounds are normal. She exhibits no  distension. There is no tenderness.  Musculoskeletal: She exhibits no edema or tenderness.  Neurological: She is alert and oriented  to person, place, and time.  Speech clear.  Cognition intact.  Appropriate and interactive.  Motor: B/l UE: 5/5 proximal to distal. B/l LE: 0/5 proximal to distal Decreased sensation distal to hip.  DTRs 1+ b/l LE.  Skin: Skin is warm and dry. No rash noted. She is not diaphoretic. No erythema.  Psychiatric: She has a normal mood and affect. Her behavior is normal. Judgment and thought content normal.    Results for orders placed or performed during the hospital encounter of 12/20/16 (from the past 24 hour(s))  Glucose, capillary     Status: Abnormal   Collection Time: 12/27/16 11:59 AM  Result Value Ref Range   Glucose-Capillary 125 (H) 65 - 99 mg/dL  Glucose, capillary     Status: Abnormal   Collection Time: 12/27/16  4:40 PM  Result Value Ref Range   Glucose-Capillary 116 (H) 65 - 99 mg/dL  Glucose, capillary     Status: Abnormal   Collection Time: 12/27/16  9:49 PM  Result Value Ref Range   Glucose-Capillary 130 (H) 65 - 99 mg/dL  Glucose, capillary     Status: Abnormal   Collection Time: 12/28/16  6:20 AM  Result Value Ref Range   Glucose-Capillary 145 (H) 65 - 99 mg/dL   No results found.  Assessment/Plan: Diagnosis: Transverse myelitis Labs and images independently reviewed.  Records reviewed and summated above.     Skin: daily skin checks, turn q2, PRAFO, continue use     pressure relieving mattress      Cardiovascular: anticipate orthostasis when OOB. May use     abdominal     binder, TEDs or ace wraps to BLE for this. If ineffective, consider salt     tabs,     midodrine or fludrocortisone.       Extremities:. pt is at risk for flexion contractures, especially the hip. Continue ROM.     Psych: psychology consult for adjustment to disability for pt and family     Spasticity: may develop spasticity. Manage spasticity only if indicated     (pain, hygiene, prevention of contractures, functional impairment).     Pain Management:  control with oral medications if  possible     Bladder:  Monitor for neurogenic bladder as when spasticity starts to develop. May confirm this with serial PVRs to r/o retention/atonic bladder. Consider in/out clean catherization. Implement bladder program . Encourage self I&O cath training vs     indwelling foley if possible to improve mobility, reduce infection, and     increase safety     Bowel: Implement mechanical and chemical bowel program and care  training with scheduled suppository 30 min to 1 hour after meals to utilize gastrocolic and colorectal reflexes.    1. Does the need for close, 24 hr/day medical supervision in concert with the patient's rehab needs make it unreasonable for this patient to be served in a less intensive setting? Yes 2. Co-Morbidities requiring supervision/potential complications: morbid obesity (Body mass index is 56.71 kg/m., diet and exercise education, encourage weight loss to increase endurance and promote overall health), DDD with lumbar stenosis (plan to follow up as outpt), DM (Monitor in accordance with exercise and adjust meds as necessary) 3. Due to bladder management, bowel management, safety, skin/wound care, disease management, pain management and patient education, does the patient require 24 hr/day rehab nursing? Yes 4. Does the patient  require coordinated care of a physician, rehab nurse, PT (1-2 hrs/day, 5 days/week) and OT (1-2 hrs/day, 5 days/week) to address physical and functional deficits in the context of the above medical diagnosis(es)? Yes Addressing deficits in the following areas: balance, endurance, locomotion, strength, transferring, bowel/bladder control, bathing, dressing, toileting and psychosocial support 5. Can the patient actively participate in an intensive therapy program of at least 3 hrs of therapy per day at least 5 days per week? Yes 6. The potential for patient to make measurable gains while on inpatient rehab is excellent 7. Anticipated functional outcomes upon  discharge from inpatient rehab are min assist  with PT, min assist with OT, n/a with SLP. 8. Estimated rehab length of stay to reach the above functional goals is: 10-15 days. 9. Anticipated D/C setting: Home 10. Anticipated post D/C treatments: HH therapy and Home excercise program 11. Overall Rehab/Functional Prognosis: good  RECOMMENDATIONS: This patient's condition is appropriate for continued rehabilitative care in the following setting: CIR Patient has agreed to participate in recommended program. Yes Note that insurance prior authorization may be required for reimbursement for recommended care.  Comment: Rehab Admissions Coordinator to follow up.  Delice Lesch, MD, [address] [place] 12/28/2016

## 2016-12-28 NOTE — Evaluation (Signed)
Physical Therapy Evaluation Patient Details Name: Traci [name] MRN: [id number] DOB: 04/18/1976 Today's Date: 12/28/2016   History of Present Illness  Pt is a 41 y/o female with h/o obesity and DDD who presents with sudden onset of bilateral weakness with gradual degradation of both strength and sensation in L LE before R LE. MRI shows extensive thoracic lesion, but with no other CNS involvement. Pt currently being treated for transverse myelitis.   Clinical Impression  Pt progressing towards physical therapy goals. Focus of session was standing trials with the Stedy at EOB. Pt continues to require significant assist for transfers, with no change in LE function or sensation from initial PT evaluation. Feel use of the Clarise Cruz Plus may be more successful next session for transfer OOB to chair. Pt is currently appropriate for transfers with Maxi Move with nursing staff. Will continue to follow and progress as able per POC.     Follow Up Recommendations CIR    Equipment Recommendations  Other (comment) (TBD by next venue of care)    Recommendations for Other Services Rehab consult     Precautions / Restrictions Precautions Precautions: Fall Restrictions Weight Bearing Restrictions: No      Mobility  Bed Mobility Overal bed mobility: Needs Assistance Bed Mobility: Supine to Sit;Sit to Supine     Supine to sit: Mod assist Sit to supine: Mod assist   General bed mobility comments: Mod A provided for LE movement and positioning. Heavy use of rails required as pt transitioned to/from EOB. At end of session pt feeling very dizzy and hot and increased assist was required for return to supine.   Transfers Overall transfer level: Needs assistance   Transfers: Sit to/from Stand Sit to Stand: Max assist;+2 physical assistance         General transfer comment: Pt assists significantly with UE's, but no assist from LE's. Pt was able to achieve Sit<>Stand x2 for ~15 seconds each stand. Hips  did not clear bed enough to lower Stedy hip support flaps.  Ambulation/Gait             General Gait Details: Unable at this time.   Stairs            Wheelchair Mobility    Modified Rankin (Stroke Patients Only)       Balance Overall balance assessment: Needs assistance Sitting-balance support: Single extremity supported;Bilateral upper extremity supported Sitting balance-Leahy Scale: Poor Sitting balance - Comments: Pt sat EOB approx 20 min.  Able to maintain safe sitting EOB with therapists not close by.  Safest with 1 UE assist Postural control: Posterior lean Standing balance support: Bilateral upper extremity supported Standing balance-Leahy Scale: Zero Standing balance comment: stood x2 with significant 2 person facilitation with support at knees and use of pad.  Pt unable to get her trunk upright.                             Pertinent Vitals/Pain Pain Assessment: No/denies pain    Home Living                        Prior Function                 Hand Dominance        Extremity/Trunk Assessment                Communication      Cognition Arousal/Alertness: Awake/alert Behavior During  Therapy: WFL for tasks assessed/performed Overall Cognitive Status: Within Functional Limits for tasks assessed                                        General Comments      Exercises     Assessment/Plan    PT Assessment    PT Problem List         PT Treatment Interventions      PT Goals (Current goals can be found in the Care Plan section)  Acute Rehab PT Goals Patient Stated Goal: walk PT Goal Formulation: With patient Time For Goal Achievement: 01/07/17 Potential to Achieve Goals: Fair    Frequency Min 4X/week   Barriers to discharge        Co-evaluation               AM-PAC PT "6 Clicks" Daily Activity  Outcome Measure Difficulty turning over in bed (including adjusting bedclothes,  sheets and blankets)?: Total Difficulty moving from lying on back to sitting on the side of the bed? : Total Difficulty sitting down on and standing up from a chair with arms (e.g., wheelchair, bedside commode, etc,.)?: Total Help needed moving to and from a bed to chair (including a wheelchair)?: Total Help needed walking in hospital room?: Total Help needed climbing 3-5 steps with a railing? : Total 6 Click Score: 6    End of Session Equipment Utilized During Treatment: Gait belt Activity Tolerance: Treatment limited secondary to medical complications (Comment) (Pt became very hot, dizzy and diaphoretic after standing x2.) Patient left: in bed;with call bell/phone within reach;with bed alarm set Nurse Communication: Mobility status PT Visit Diagnosis: Other symptoms and signs involving the nervous system (R29.898);Muscle weakness (generalized) (M62.81)    Time: 4920-1007 PT Time Calculation (min) (ACUTE ONLY): 28 min   Charges:     PT Treatments $Therapeutic Activity: 23-37 mins   PT G Codes:        Rolinda Roan, PT, DPT Acute Rehabilitation Services Pager: [phone]   Thelma Comp 12/28/2016, 1:00 PM

## 2016-12-28 NOTE — Progress Notes (Signed)
Subjective: No improvement with LE strength.   Objective: Current vital signs: BP 136/75 (BP Location: Right Wrist)   Pulse 84   Temp 98.5 F (36.9 C) (Oral)   Resp 18   Ht 5' (1.524 m)   Wt 131.7 kg (290 lb 6.4 oz)   SpO2 98%   BMI 56.71 kg/m  Vital signs in last 24 hours: Temp:  [98 F (36.7 C)-99.3 F (37.4 C)] 98.5 F (36.9 C) (05/14 0422) Pulse Rate:  [76-90] 84 (05/14 0422) Resp:  [12-18] 18 (05/14 0422) BP: (122-149)/(54-88) 136/75 (05/14 0422) SpO2:  [96 %-100 %] 98 % (05/14 0422)  Intake/Output from previous day: 05/13 0701 - 05/14 0700 In: -  Out: 1000 [Urine:1000] Intake/Output this shift: No intake/output data recorded. Nutritional status: Diet Carb Modified Fluid consistency: Thin; Room service appropriate? Yes  ROS:                                                                                                                                       History obtained from the patient  General ROS: negative for - chills, fatigue, fever, night sweats, weight gain or weight loss Psychological ROS: negative for - behavioral disorder, hallucinations, memory difficulties, mood swings or suicidal ideation Ophthalmic ROS: negative for - blurry vision, double vision, eye pain or loss of vision ENT ROS: negative for - epistaxis, nasal discharge, oral lesions, sore throat, tinnitus or vertigo Allergy and Immunology ROS: negative for - hives or itchy/watery eyes Hematological and Lymphatic ROS: negative for - bleeding problems, bruising or swollen lymph nodes Endocrine ROS: negative for - galactorrhea, hair pattern changes, polydipsia/polyuria or temperature intolerance Respiratory ROS: negative for - cough, hemoptysis, shortness of breath or wheezing Cardiovascular ROS: negative for - chest pain, dyspnea on exertion, edema or irregular heartbeat Gastrointestinal ROS: negative for - abdominal pain, diarrhea, hematemesis, nausea/vomiting or stool  incontinence Genito-Urinary ROS: negative for - dysuria, hematuria, incontinence or urinary frequency/urgency Musculoskeletal ROS: negative for - joint swelling or muscular weakness Neurological ROS: as noted in HPI Dermatological ROS: negative for rash and skin lesion changes    Neurologic Exam: General: NAD Mental Status: Alert, oriented, thought content appropriate.  Speech fluent without evidence of aphasia.  Able to follow 3 step commands without difficulty. Cranial Nerves: II:  Visual fields grossly normal, pupils equal, round, reactive to light and accommodation III,IV, VI: ptosis not present, extra-ocular motions intact bilaterally V,VII: smile symmetric, facial light touch sensation normal bilaterally VIII: hearing normal bilaterally IX,X: uvula rises symmetrically XI: bilateral shoulder shrug XII: midline tongue extension without atrophy or fasciculations  Motor: Right : Upper extremity   5/5    Left:     Upper extremity   5/5  Lower extremity   0/5     Lower extremity   0/5  Sensory: Decreased temperature sensation bilateral lower extremities.  Severely impaired proprioception to toes and ankles bilaterally. Decreased fine touch bilateral  lower extremities, left worse than right.  Sensory level is at L1 bilaterally, with levels more rostrally exhibiting normal sensation on exam.  Deep Tendon Reflexes: 2+ bilateral brachioradialis and biceps. 0 patellae and achilles bilaterally. Toes mute bilaterally.  Cerebellar: Normal FNF bilaterally    Lab Results: Basic Metabolic Panel:  Recent Labs Lab 12/24/16 1339  CREATININE 0.73    Liver Function Tests:  Recent Labs Lab 12/22/16 1408  ALBUMIN 3.6   No results for input(s): LIPASE, AMYLASE in the last 168 hours. No results for input(s): AMMONIA in the last 168 hours.  CBC:  Recent Labs Lab 12/24/16 1339  WBC 10.5  HGB 12.1  HCT 38.0  MCV 78.8  PLT 221    Cardiac Enzymes: No results for input(s):  CKTOTAL, CKMB, CKMBINDEX, TROPONINI in the last 168 hours.  Lipid Panel: No results for input(s): CHOL, TRIG, HDL, CHOLHDL, VLDL, LDLCALC in the last 168 hours.  CBG:  Recent Labs Lab 12/27/16 0616 12/27/16 1159 12/27/16 1640 12/27/16 2149 12/28/16 0620  GLUCAP 131* 125* 116* 130* 145*    Microbiology: No results found for this or any previous visit.  Coagulation Studies: No results for input(s): LABPROT, INR in the last 72 hours.  Imaging: No results found.  Medications:  Scheduled: . gabapentin  300 mg Oral TID  . heparin  5,000 Units Subcutaneous Q8H  . Immune Globulin 10%  400 mg/kg Intravenous Q24 Hr x 2  . meloxicam  7.5 mg Oral Daily  . nystatin   Topical TID  . polyethylene glycol  17 g Oral BID    Assessment/Plan:  1) Transverse myelitis: MRI Brain, C/T/L spine showed no new lesions, no contrast enhancements  thoracic lesions C/O transverse myelitis.  -LP under flor done  CSF: WBC 106, RBC 3550, TP pending, rest of studies pending  - Solu-Medrol 1g qD completed 5 doses -Finishedt immunotherapy as the IGA was within normal limits. And now finished 5 courses PLEX.  -Continue Neurontin  -PT/OT  --At this time patient will likely need placement at SNF or CIR as she has no LE mobilization. No further neurological recommendations after last dose of PLEX.  Neurology S/O   Etta Quill PA-C [facility] [phone]  12/28/2016, 11:07 AM

## 2016-12-29 ENCOUNTER — Inpatient Hospital Stay (HOSPITAL_COMMUNITY)
Admission: RE | Admit: 2016-12-29 | Discharge: 2017-01-27 | DRG: 052 | Disposition: A | Discharge status: Skilled Nursing Facility | Payer: PRIVATE HEALTH INSURANCE | Source: Intra-hospital | Attending: Physical Medicine & Rehabilitation | Admitting: Physical Medicine & Rehabilitation

## 2016-12-29 ENCOUNTER — Encounter (HOSPITAL_COMMUNITY): Payer: Self-pay | Admitting: Physical Medicine and Rehabilitation

## 2016-12-29 DIAGNOSIS — N390 Urinary tract infection, site not specified: Secondary | ICD-10-CM | POA: Diagnosis present

## 2016-12-29 DIAGNOSIS — M4714 Other spondylosis with myelopathy, thoracic region: Secondary | ICD-10-CM | POA: Diagnosis not present

## 2016-12-29 DIAGNOSIS — G373 Acute transverse myelitis in demyelinating disease of central nervous system: Secondary | ICD-10-CM | POA: Diagnosis present

## 2016-12-29 DIAGNOSIS — E1169 Type 2 diabetes mellitus with other specified complication: Secondary | ICD-10-CM | POA: Diagnosis not present

## 2016-12-29 DIAGNOSIS — E871 Hypo-osmolality and hyponatremia: Secondary | ICD-10-CM | POA: Diagnosis present

## 2016-12-29 DIAGNOSIS — K592 Neurogenic bowel, not elsewhere classified: Secondary | ICD-10-CM | POA: Diagnosis present

## 2016-12-29 DIAGNOSIS — Z1629 Resistance to other single specified antibiotic: Secondary | ICD-10-CM | POA: Diagnosis present

## 2016-12-29 DIAGNOSIS — E119 Type 2 diabetes mellitus without complications: Secondary | ICD-10-CM | POA: Diagnosis present

## 2016-12-29 DIAGNOSIS — B9620 Unspecified Escherichia coli [E. coli] as the cause of diseases classified elsewhere: Secondary | ICD-10-CM | POA: Diagnosis present

## 2016-12-29 DIAGNOSIS — N319 Neuromuscular dysfunction of bladder, unspecified: Secondary | ICD-10-CM | POA: Diagnosis present

## 2016-12-29 DIAGNOSIS — K5900 Constipation, unspecified: Secondary | ICD-10-CM | POA: Diagnosis present

## 2016-12-29 DIAGNOSIS — I82402 Acute embolism and thrombosis of unspecified deep veins of left lower extremity: Secondary | ICD-10-CM | POA: Diagnosis present

## 2016-12-29 DIAGNOSIS — R29898 Other symptoms and signs involving the musculoskeletal system: Secondary | ICD-10-CM

## 2016-12-29 DIAGNOSIS — Z79899 Other long term (current) drug therapy: Secondary | ICD-10-CM

## 2016-12-29 DIAGNOSIS — E669 Obesity, unspecified: Secondary | ICD-10-CM

## 2016-12-29 DIAGNOSIS — Z6841 Body Mass Index (BMI) 40.0 and over, adult: Secondary | ICD-10-CM | POA: Diagnosis not present

## 2016-12-29 DIAGNOSIS — Z833 Family history of diabetes mellitus: Secondary | ICD-10-CM

## 2016-12-29 DIAGNOSIS — E0965 Drug or chemical induced diabetes mellitus with hyperglycemia: Secondary | ICD-10-CM

## 2016-12-29 DIAGNOSIS — G629 Polyneuropathy, unspecified: Secondary | ICD-10-CM | POA: Diagnosis present

## 2016-12-29 DIAGNOSIS — B964 Proteus (mirabilis) (morganii) as the cause of diseases classified elsewhere: Secondary | ICD-10-CM | POA: Diagnosis present

## 2016-12-29 DIAGNOSIS — I82492 Acute embolism and thrombosis of other specified deep vein of left lower extremity: Secondary | ICD-10-CM | POA: Diagnosis not present

## 2016-12-29 DIAGNOSIS — T380X5A Adverse effect of glucocorticoids and synthetic analogues, initial encounter: Secondary | ICD-10-CM | POA: Diagnosis present

## 2016-12-29 DIAGNOSIS — I82452 Acute embolism and thrombosis of left peroneal vein: Secondary | ICD-10-CM

## 2016-12-29 DIAGNOSIS — D62 Acute posthemorrhagic anemia: Secondary | ICD-10-CM | POA: Diagnosis present

## 2016-12-29 DIAGNOSIS — R740 Nonspecific elevation of levels of transaminase and lactic acid dehydrogenase [LDH]: Secondary | ICD-10-CM | POA: Diagnosis not present

## 2016-12-29 DIAGNOSIS — A498 Other bacterial infections of unspecified site: Secondary | ICD-10-CM

## 2016-12-29 DIAGNOSIS — K649 Unspecified hemorrhoids: Secondary | ICD-10-CM

## 2016-12-29 DIAGNOSIS — Z791 Long term (current) use of non-steroidal anti-inflammatories (NSAID): Secondary | ICD-10-CM | POA: Diagnosis not present

## 2016-12-29 DIAGNOSIS — R1084 Generalized abdominal pain: Secondary | ICD-10-CM | POA: Diagnosis not present

## 2016-12-29 DIAGNOSIS — M792 Neuralgia and neuritis, unspecified: Secondary | ICD-10-CM

## 2016-12-29 DIAGNOSIS — M7989 Other specified soft tissue disorders: Secondary | ICD-10-CM | POA: Diagnosis not present

## 2016-12-29 DIAGNOSIS — G8220 Paraplegia, unspecified: Secondary | ICD-10-CM | POA: Diagnosis present

## 2016-12-29 LAB — GLUCOSE, CAPILLARY
GLUCOSE-CAPILLARY: 153 mg/dL — AB (ref 65–99)
Glucose-Capillary: 133 mg/dL — ABNORMAL HIGH (ref 65–99)
Glucose-Capillary: 114 mg/dL — ABNORMAL HIGH (ref 65–99)
Glucose-Capillary: 121 mg/dL — ABNORMAL HIGH (ref 65–99)

## 2016-12-29 LAB — CMV DNA BY PCR, QUALITATIVE: CMV DNA, Qual PCR: NEGATIVE

## 2016-12-29 MED ORDER — POLYETHYLENE GLYCOL 3350 17 G PO PACK: 17.0000 g | PACK | Freq: Two times a day (BID) | ORAL | 0 refills | Status: DC

## 2016-12-29 MED ORDER — GABAPENTIN 300 MG PO CAPS
300.0000 mg | ORAL_CAPSULE | Freq: Three times a day (TID) | ORAL | Status: DC
  Administered 2016-12-29 – 2017-01-27 (×86): 300 mg via ORAL
  Filled 2016-12-29: qty 3
  Filled 2016-12-29 (×11): qty 1
  Filled 2016-12-29: qty 3
  Filled 2016-12-29 (×60): qty 1
  Filled 2016-12-29: qty 3
  Filled 2016-12-29 (×6): qty 1
  Filled 2016-12-29: qty 3
  Filled 2016-12-29 (×6): qty 1

## 2016-12-29 MED ORDER — METHOCARBAMOL 500 MG PO TABS
500.0000 mg | ORAL_TABLET | Freq: Four times a day (QID) | ORAL | Status: DC | PRN
  Administered 2016-12-30 – 2017-01-25 (×21): 500 mg via ORAL
  Filled 2016-12-29 (×22): qty 1

## 2016-12-29 MED ORDER — ACETAMINOPHEN 325 MG PO TABS: 325.0000 mg | ORAL_TABLET | ORAL | Status: DC | PRN

## 2016-12-29 MED ORDER — POLYETHYLENE GLYCOL 3350 17 G PO PACK
17.0000 g | PACK | Freq: Every day | ORAL | Status: DC | PRN
  Filled 2016-12-29: qty 1

## 2016-12-29 MED ORDER — TRAZODONE HCL 50 MG PO TABS: 25.0000 mg | ORAL_TABLET | Freq: Every evening | ORAL | Status: DC | PRN

## 2016-12-29 MED ORDER — ENOXAPARIN SODIUM 40 MG/0.4ML ~~LOC~~ SOLN
40.0000 mg | SUBCUTANEOUS | Status: DC
  Administered 2016-12-29: 40 mg via SUBCUTANEOUS
  Filled 2016-12-29: qty 0.4

## 2016-12-29 MED ORDER — BISACODYL 10 MG RE SUPP
10.0000 mg | Freq: Every day | RECTAL | Status: DC
  Administered 2016-12-30 – 2017-01-21 (×20): 10 mg via RECTAL
  Filled 2016-12-29 (×22): qty 1

## 2016-12-29 MED ORDER — SENNA 8.6 MG PO TABS
2.0000 | ORAL_TABLET | Freq: Every day | ORAL | Status: DC
  Administered 2016-12-29 – 2017-01-05 (×8): 17.2 mg via ORAL
  Filled 2016-12-29 (×9): qty 2

## 2016-12-29 MED ORDER — MELOXICAM 7.5 MG PO TABS
7.5000 mg | ORAL_TABLET | Freq: Every day | ORAL | Status: DC
  Administered 2016-12-30 – 2017-01-17 (×19): 7.5 mg via ORAL
  Filled 2016-12-29 (×19): qty 1

## 2016-12-29 MED ORDER — OXYCODONE-ACETAMINOPHEN 5-325 MG PO TABS: 1.0000 | ORAL_TABLET | ORAL | 0 refills | Status: DC | PRN

## 2016-12-29 MED ORDER — NYSTATIN 100000 UNIT/GM EX POWD
Freq: Three times a day (TID) | CUTANEOUS | Status: DC
  Administered 2016-12-29 – 2017-01-27 (×61): via TOPICAL
  Filled 2016-12-29: qty 15

## 2016-12-29 MED ORDER — GUAIFENESIN-DM 100-10 MG/5ML PO SYRP: 5.0000 mL | ORAL_SOLUTION | Freq: Four times a day (QID) | ORAL | Status: DC | PRN

## 2016-12-29 MED ORDER — POLYETHYLENE GLYCOL 3350 17 G PO PACK
17.0000 g | PACK | Freq: Every day | ORAL | Status: DC
  Administered 2017-01-02 – 2017-01-05 (×4): 17 g via ORAL
  Filled 2016-12-29 (×6): qty 1

## 2016-12-29 MED ORDER — PROCHLORPERAZINE 25 MG RE SUPP: 12.5000 mg | Freq: Four times a day (QID) | RECTAL | Status: DC | PRN

## 2016-12-29 MED ORDER — PROCHLORPERAZINE EDISYLATE 5 MG/ML IJ SOLN: 5.0000 mg | Freq: Four times a day (QID) | INTRAMUSCULAR | Status: DC | PRN

## 2016-12-29 MED ORDER — ALUM & MAG HYDROXIDE-SIMETH 200-200-20 MG/5ML PO SUSP: 30.0000 mL | ORAL | Status: DC | PRN

## 2016-12-29 MED ORDER — FLEET ENEMA 7-19 GM/118ML RE ENEM: 1.0000 | ENEMA | Freq: Once | RECTAL | Status: DC | PRN

## 2016-12-29 MED ORDER — PROCHLORPERAZINE MALEATE 5 MG PO TABS
5.0000 mg | ORAL_TABLET | Freq: Four times a day (QID) | ORAL | Status: DC | PRN
  Administered 2017-01-02: 5 mg via ORAL
  Administered 2017-01-21: 10 mg via ORAL
  Filled 2016-12-29: qty 2
  Filled 2016-12-29: qty 1
  Filled 2016-12-29: qty 2

## 2016-12-29 MED ORDER — DIPHENHYDRAMINE HCL 12.5 MG/5ML PO ELIX: 12.5000 mg | ORAL_SOLUTION | Freq: Four times a day (QID) | ORAL | Status: DC | PRN

## 2016-12-29 NOTE — Progress Notes (Signed)
Patient arrived on 15M at 1700, assigned to room 15M08, Inpatient Rehab.

## 2016-12-29 NOTE — Progress Notes (Signed)
Physical Therapy Treatment Patient Details Name: Traci [name] MRN: [id number] DOB: December 10, 1975 Today's Date: 12/29/2016    History of Present Illness Pt is a 41 y/o female with h/o obesity and DDD who presents with sudden onset of bilateral weakness with gradual degradation of both strength and sensation in L LE before R LE. MRI shows extensive thoracic lesion, but with no other CNS involvement. Pt currently being treated for transverse myelitis.     PT Comments    Focus of session today was standing trials at EOB with [place]. Pt was able to achieve full stand x2 holding from 20-30 seconds each time. Pt motivated to participate with therapy and is hopeful for CIR admission today. Will continue to follow and progress as able per POC.   Follow Up Recommendations  CIR     Equipment Recommendations  Other (comment) (TBD by next venue of care)    Recommendations for Other Services Rehab consult     Precautions / Restrictions Precautions Precautions: Fall Restrictions Weight Bearing Restrictions: No    Mobility  Bed Mobility Overal bed mobility: Needs Assistance Bed Mobility: Supine to Sit;Sit to Supine     Supine to sit: Mod assist Sit to supine: Mod assist   General bed mobility comments: Pt able to achieve sitting EOB with HOB elevated and use of rails for support. Mod assist was provided for LE movement. Bed pad utilized for scooting assist as well.   Transfers Overall transfer level: Needs assistance   Transfers: Sit to/from Stand Sit to Stand: Total assist;+2 safety/equipment;+2 physical assistance         General transfer comment: Clarise Cruz Plus utilized for sit<>stand x2. Pt was able to stand with improved posture and hold for ~30 seconds first stand and ~20 seconds second stand.   Ambulation/Gait             General Gait Details: Unable at this time.    Stairs            Wheelchair Mobility    Modified Rankin (Stroke Patients Only)        Balance Overall balance assessment: Needs assistance Sitting-balance support: Single extremity supported;Bilateral upper extremity supported Sitting balance-Leahy Scale: Poor Sitting balance - Comments: Pt requires BUE to maintain sitting balance Postural control: Posterior lean Standing balance support: Bilateral upper extremity supported Standing balance-Leahy Scale: Zero                              Cognition Arousal/Alertness: Awake/alert Behavior During Therapy: WFL for tasks assessed/performed Overall Cognitive Status: Within Functional Limits for tasks assessed                                        Exercises      General Comments        Pertinent Vitals/Pain Pain Assessment: No/denies pain    Home Living   Living Arrangements:  (Lives with roommate for 5 years; 3 children live in [place])         Home Layout: Two level;Able to live on main level with bedroom/bathroom;1/2 bath on main level        Prior Function            PT Goals (current goals can now be found in the care plan section) Acute Rehab PT Goals Patient Stated Goal: walk PT Goal Formulation: With  patient Time For Goal Achievement: 01/07/17 Potential to Achieve Goals: Fair Progress towards PT goals: Progressing toward goals    Frequency    Min 4X/week      PT Plan Current plan remains appropriate    Co-evaluation              AM-PAC PT "6 Clicks" Daily Activity  Outcome Measure  Difficulty turning over in bed (including adjusting bedclothes, sheets and blankets)?: Total Difficulty moving from lying on back to sitting on the side of the bed? : Total Difficulty sitting down on and standing up from a chair with arms (e.g., wheelchair, bedside commode, etc,.)?: Total Help needed moving to and from a bed to chair (including a wheelchair)?: Total Help needed walking in hospital room?: Total Help needed climbing 3-5 steps with a railing? : Total 6  Click Score: 6    End of Session Equipment Utilized During Treatment: Gait belt Activity Tolerance: Patient tolerated treatment well Patient left: in bed;with call bell/phone within reach;with bed alarm set Nurse Communication: Mobility status;Need for lift equipment PT Visit Diagnosis: Other symptoms and signs involving the nervous system (R29.898);Muscle weakness (generalized) (M62.81)     Time: 9672-8979 PT Time Calculation (min) (ACUTE ONLY): 31 min  Charges:  $Therapeutic Activity: 23-37 mins                    G Codes:       Rolinda Roan, PT, DPT Acute Rehabilitation Services Pager: [phone]    Thelma Comp 12/29/2016, 3:17 PM

## 2016-12-29 NOTE — H&P (Signed)
Physical Medicine and Rehabilitation Admission H&P       Chief Complaint  Patient presents with  . Transverse myelitis    HPI: Traci Gonzalezis a 41 y.o.femalewith history of morbid obesity, DDD with reports of left sciatica over past couple of months and one week history of pain LLE with numbness tingling. She developed sudden onset of LLE weakness spreading to RLE with burning and BLE instability. She was admitted on 12/20/16 with BLE numbness, band like pain in bilateral lumbar region, urinary incontinence,and reports of fevers. MRI Tspine reviewed, showing increased signal T-spine and distal. Per report, hyperintense signal abnormality within mid thoracic cord to conus medullaris concerning for demyelination, moderate L4-5 disc extrusion with mild canal stenosis affecting L5 nerve, moderate right L4/5 neural foraminal narrowing. MRI brain without white matter lesion or abnormal enhancement. She was found to have decreased rectal tone as well as urinary retention. Neurology felt that symptoms due to transverse myelitis with appearance most consistent with inflammatory v/s viral and patient treated with IV solumedrol X 5 days as well as empiric IV acyclovir. LP done showing elevated WBC 106 and glucose 92. CSF negative for oligoclonal bands.  Dr. Arnoldo Morale consulted for input and felt that LLE pain due to L4/5 moderate stenosis and that this could be addressed in future if necessary. She had minimal improvement with steroids and treated with IVIG X 5 days. Serum NMO-IgG, EBV PCR and CMV PCR pending. 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 She has had some improvement in sensation BLE but continues to have paraplegia with substantial decline in mobility as well as ability to carry out ADL tasks. She is able to sit at EOB for 20 minutes and stand with significant assist. Therapy ongoing and CIR recommended for follow up therapy   Review of Systems    Constitutional: Negative for chills, fever and malaise/fatigue.  HENT: Negative for hearing loss and tinnitus.   Eyes: Negative for blurred vision and double vision.  Respiratory: Positive for cough. Negative for sputum production and shortness of breath.   Cardiovascular: Negative for chest pain and palpitations.  Gastrointestinal: Negative for abdominal pain, heartburn and nausea.       Incontinent of bowel due to lack of sensation. Reports cramping sensation now--question bladder?   Genitourinary: Negative for frequency and urgency.  Musculoskeletal: Negative for back pain and myalgias.  Skin: Negative for itching and rash.  Neurological: Positive for sensory change and focal weakness. Negative for dizziness and headaches.  Psychiatric/Behavioral: Negative for memory loss. The patient does not have insomnia.           Past Medical History:  Diagnosis Date  . H/O degenerative disc disease   . Obesity          Past Surgical History:  Procedure Laterality Date  . fibroid tumor    . KNEE ARTHROSCOPY    . TUBAL LIGATION           Family History  Problem Relation Age of Onset  . Diabetes Mother   . 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 She lives with a roommate (and her daughter). Family lives OOT and will not be able to help after discharge. She reports that she has never smoked. She has never used smokeless tobacco. She reports that she does not drink alcohol or use drugs.    Allergies: No Known Allergies  Medications Prior to Admission  Medication Sig Dispense Refill  . cyclobenzaprine (FLEXERIL) 10 MG tablet Take 1 tablet (10 mg total) by mouth 3 (three) times daily as needed for muscle spasms. 30 tablet 0  . gabapentin (NEURONTIN) 100 MG capsule Can start with one at bedtime and increase to 3 times per day over next 1 week as tolerated. 30 capsule 0  . meloxicam (MOBIC) 7.5 MG  tablet Take 1 tablet (7.5 mg total) by mouth daily. 30 tablet 0    Home: Home Living Family/patient expects to be discharged to:: Private residence Living Arrangements:  (Lives with roommate for 5 years; 3 children live in [place]) Available Help at Discharge: Friend(s), Family, Available PRN/intermittently Type of Home: House Home Access: Stairs to enter Technical brewer of Steps: about 3 Entrance Stairs-Rails: Right, Left Home Layout: Two level, Able to live on main level with bedroom/bathroom, 1/2 bath on main level Alternate Level Stairs-Number of Steps: 17-20 Alternate Level Stairs-Rails: Left Bathroom Shower/Tub:  (1/2 bath downstairs) Bathroom Toilet: Standard Bathroom Accessibility: Yes Home Equipment: None  Lives With: Friend(s)   Functional History: Prior Function Level of Independence: Independent Comments: Pt works as a Training and development officer at [employer], was driving   Functional Status:  Mobility: Bed Mobility Overal bed mobility: Needs Assistance Bed Mobility: Supine to Sit, Sit to Supine Supine to sit: Mod assist Sit to supine: Mod assist General bed mobility comments: Pt able to achieve sitting EOB with HOB elevated and use of rails for support. Mod assist was provided for LE movement. Bed pad utilized for scooting assist as well.  Transfers Overall transfer level: Needs assistance Transfer via Lift Equipment: Marketing executive Transfers: Sit to/from Stand Sit to Stand: Total assist, +2 safety/equipment, +2 physical assistance Squat pivot transfers: Total assist, +2 physical assistance General transfer comment: Clarise Cruz Plus utilized for sit<>stand x2. Pt was able to stand with improved posture and hold for ~30 seconds first stand and ~20 seconds second stand.  Ambulation/Gait General Gait Details: Unable at this time.   ADL: ADL Overall ADL's : Needs assistance/impaired Eating/Feeding: Sitting, Set up Grooming: Wash/dry face, Set up, Bed level Grooming Details  (indicate cue type and reason): supported sitting, increased assist for unsupported  Upper Body Bathing: Min guard, Sitting Upper Body Bathing Details (indicate cue type and reason): supported sitting, increased assist for unsupported  Lower Body Bathing: Moderate assistance, Bed level Upper Body Dressing : Min guard, Sitting Upper Body Dressing Details (indicate cue type and reason): supported sitting, increased assist for unsupported  Lower Body Dressing: Total assistance, Bed level Lower Body Dressing Details (indicate cue type and reason): donning socks Toilet Transfer: Total assistance Toileting- Clothing Manipulation and Hygiene: Total assistance, Bed level Toileting - Clothing Manipulation Details (indicate cue type and reason): foley Functional mobility during ADLs: Maximal assistance, +2 for physical assistance, +2 for safety/equipment General ADL Comments: Pt completed bed level grooming ADLs, session included BUE HEP  Cognition: Cognition Overall Cognitive Status: Within Functional Limits for tasks assessed Orientation Level: Oriented X4 Cognition Arousal/Alertness: Awake/alert Behavior During Therapy: WFL for tasks assessed/performed Overall Cognitive Status: Within Functional Limits for tasks assessed General Comments: Pt tearful towards end of session, when speaking of her family; family is not local, though Pt does have support from roommate locally    Blood pressure 125/80, pulse (!) 101, temperature 98.3 F (36.8 C), temperature source Oral, resp. rate 20, height 5' (1.524 m), weight 131.7 kg (290 lb 6.4 oz), SpO2 97 %. Physical Exam  Nursing note and  vitals reviewed. Constitutional: She is oriented to person, place, and time. She appears well-developed and well-nourished. No distress.  Obese female. Pleasant and appropriate  HENT:  Head: Normocephalic and atraumatic.  Mouth/Throat: Oropharynx is clear and moist.  Eyes: Conjunctivae and EOM are normal. Pupils are  equal, round, and reactive to light.  Neck: Normal range of motion. Neck supple.  Cardiovascular: Normal rate and regular rhythm.   Respiratory: Effort normal and breath sounds normal. No stridor. No respiratory distress. She has no wheezes.  GI: Soft. Bowel sounds are normal. She exhibits no distension. There is no tenderness.  Genitourinary:  Genitourinary Comments: Foley in place  Musculoskeletal: She exhibits no edema or tenderness.  Neurological: She is alert and oriented to person, place, and time.  Speech clear. Follows commands without difficulty. No evidence of emerging tone. No clonus. UE motor 5/5 prox to distal. LE: 0/5 HF, KE and ADF/PF. Sensation decreased to LT/Pain below inguinal area. Does sense gross touch. Cognitively intact.   Skin: Skin is warm and dry. She is not diaphoretic.  Psychiatric: She has a normal mood and affect. Her speech is normal and behavior is normal. Judgment and thought content normal. Cognition and memory are normal.    Lab Results Last 48 Hours       Results for orders placed or performed during the hospital encounter of 12/20/16 (from the past 48 hour(s))  Glucose, capillary     Status: Abnormal   Collection Time: 12/27/16  4:40 PM  Result Value Ref Range   Glucose-Capillary 116 (H) 65 - 99 mg/dL  Glucose, capillary     Status: Abnormal   Collection Time: 12/27/16  9:49 PM  Result Value Ref Range   Glucose-Capillary 130 (H) 65 - 99 mg/dL  Glucose, capillary     Status: Abnormal   Collection Time: 12/28/16  6:20 AM  Result Value Ref Range   Glucose-Capillary 145 (H) 65 - 99 mg/dL  Glucose, capillary     Status: Abnormal   Collection Time: 12/28/16 11:49 AM  Result Value Ref Range   Glucose-Capillary 140 (H) 65 - 99 mg/dL  Glucose, capillary     Status: Abnormal   Collection Time: 12/28/16  5:02 PM  Result Value Ref Range   Glucose-Capillary 108 (H) 65 - 99 mg/dL  Glucose, capillary     Status: Abnormal   Collection Time:  12/28/16  9:28 PM  Result Value Ref Range   Glucose-Capillary 125 (H) 65 - 99 mg/dL  Glucose, capillary     Status: Abnormal   Collection Time: 12/29/16  6:32 AM  Result Value Ref Range   Glucose-Capillary 133 (H) 65 - 99 mg/dL   Comment 1 Notify RN    Comment 2 Document in Chart   Glucose, capillary     Status: Abnormal   Collection Time: 12/29/16 11:24 AM  Result Value Ref Range   Glucose-Capillary 153 (H) 65 - 99 mg/dL   Comment 1 Notify RN    Comment 2 Document in Chart      Imaging Results (Last 48 hours)  No results found.       Medical Problem List and Plan: 1.  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  DVT Prophylaxis/Anticoagulation: Pharmaceutical: Lovenox 3. Pain Management: paraesthesias controlled on Neurontin tid and Mobic daily.  4. Mood: LCSW to follow for evaluation and support.  5.  Neuropsych: This patient is capable of making decisions on her own behalf. 6. Skin/Wound Care: Educate on importance of boosting and pressure relief measures.  7. Fluids/Electrolytes/Nutrition: Monitor I/O. Check lytes in am. 8. T2DM: new diagnosis with Hgb A1C-6.9 at admission. BS likely higher due to prednisone-- Monitor BS ac/hs--SSI for now.  Educate patient on CM diet.  9. Neurogenic bowel:  Incontinent of bowel twice a day per patient. Change miralax to plain Senna and schedule suppository daily in am. 10. Morbid obesity: Educate on appropriate diet. Will require bariatric equipment as well as skin monitoring to prevent breakdown.   11. Neurogenic bladder: foley in place at present. Voiding trial as appropriate    Post Admission Physician Evaluation: 1. Functional deficits secondary  to transverse myelitis. 2. Patient is admitted to receive collaborative, interdisciplinary care between the physiatrist, rehab nursing staff, and therapy team. 3. Patient's  level of medical complexity and substantial therapy needs in context of that medical necessity cannot be provided at a lesser intensity of care such as a SNF. 4. Patient has experienced substantial functional loss from his/her baseline which was documented above under the "Functional History" and "Functional Status" headings.  Judging by the patient's diagnosis, physical exam, and functional history, the patient has potential for functional progress which will result in measurable gains while on inpatient rehab.  These gains will be of substantial and practical use upon discharge  in facilitating mobility and self-care at the household level. 5. Physiatrist will provide 24 hour management of medical needs as well as oversight of the therapy plan/treatment and provide guidance as appropriate regarding the interaction of the two. 6. The Preadmission Screening has been reviewed and patient status is unchanged unless otherwise stated above. 7. 24 hour rehab nursing will assist with bladder management, bowel management, safety, skin/wound care, disease management, medication administration, pain management and patient education  and help integrate therapy concepts, techniques,education, etc. 8. PT will assess and treat for/with: Lower extremity strength, range of motion, stamina, balance, functional mobility, safety, adaptive techniques and equipment, NMR, spinal cord injury education.   Goals are: mod I to min assist 9. OT will assess and treat for/with: ADL's, functional mobility, safety, upper extremity strength, adaptive techniques and equipment, NMR, family education, ego support, pain mgt.   Goals are: mod I to min assist. Therapy may proceed with showering this patient. 10. SLP will assess and treat for/with: n/a.  Goals are: n/a. 11. Case Management and Social Worker will assess and treat for psychological issues and discharge planning. 12. Team conference will be held weekly to assess progress toward  goals and to determine barriers to discharge. 13. Patient will receive at least 3 hours of therapy per day at least 5 days per week. 14. ELOS: 21-28 days       15. Prognosis:  excellent     Meredith Staggers, MD, [facility] 12/29/2016  Bary Leriche, Hershal Coria 12/29/2016

## 2016-12-29 NOTE — Progress Notes (Signed)
I have insurance approval and can adit pt to inpt rehab today. Pt in agreement. I will notify Attending service, RN CM and SW. I will make the arrangements to admit today. [phone]

## 2016-12-29 NOTE — Progress Notes (Signed)
Traci Arn, MD Physician Signed Physical Medicine and Rehabilitation  Consult Note Date of Service: 12/28/2016 8:34 AM  Related encounter: ED to Hosp-Admission (Current) from 12/20/2016 in [place] All Collapse All   [] Hide copied text [] Hover for attribution information      Physical Medicine and Rehabilitation Consult  Reason for Consult: Transverse myelitis Referring Physician:  Dr. Lynnae January   HPI: Traci [name] is a 41 y.o. female with history of morbid obesity, DDD with reports of left sciatica over past couple of months and one week history of pain LLE with numbness tingling. She developed sudden onset of LLE weakness spreading to RLE with burning and BLE instability. She was admitted on 12/20/16 with BLE numbness, band like pain in bilateral lumbar region, urinary incontinence, and reports of fevers. MRI Tspine reviewed, showing increased signal T-spine and distal.  Per report, hyperintense signal abnormality within mid thoracic cord to conus medullaris concerning for demyelination, moderate L4-5 disc extrusion with mild canal stenosis affecting L5 nerve, moderate right L4/5 neural foraminal narrowing. MRI brain without white matter lesion or abnormal enhancement. She was found to have decreased rectal tone as well as urinary retention. Neurology felt that symptoms due to transverse myelitis with appearance most consistent with inflammatory v/s viral and patient treated with IV solumedrol X 5 days as well as empiric IV acyclovir.  LP done showing elevated WBC 106 and glucose 92. CSF negative for oligoclonal bands.  Dr. Arnoldo Morale consulted for input and felt that LLE pain due to L4/5 moderate stenosis and that this could be addressed in future if necessary. She had minimal improvement with steroids and treated with IVIG X 5 days. Serum NMO-IgG, EBV PCR and CMV PCR pending. 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 She has had some improvement in sensation BLE but continues to have paraplegia with substantial decline in mobility as well as ability to carry out ADL tasks. She is able to sit at EOB for 20 minutes and stand with significant assist. Therapy ongoing and CIR recommended for follow up therapy.   Review of Systems  Constitutional: Negative for chills and fever.  HENT: Negative for hearing loss and tinnitus.   Eyes: Negative for blurred vision and double vision.  Respiratory: Negative for cough and shortness of breath.   Cardiovascular: Negative for chest pain, palpitations and leg swelling.  Gastrointestinal: Positive for nausea. Negative for abdominal pain and heartburn.  Genitourinary: Positive for frequency (prior to admission). Negative for dysuria.  Musculoskeletal: Positive for myalgias.  Skin: Negative for itching and rash.  Neurological: Positive for dizziness (when up in chair), tingling, sensory change, focal weakness, weakness and headaches.  Psychiatric/Behavioral: Negative for depression and memory loss. The patient is not nervous/anxious.   All other systems reviewed and are negative.         Past Medical History:  Diagnosis Date  . H/O degenerative disc disease   . Obesity          Past Surgical History:  Procedure Laterality Date  . fibroid tumor    . KNEE ARTHROSCOPY    . TUBAL LIGATION      History reviewed. No pertinent family history of similar symptoms.    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 She lives with a roommate (and her daughter).  Family lives OOT and will not be able to help after discharge. She reports that she has never smoked.  She has never used smokeless tobacco. She reports that she does not drink alcohol or use drugs.    Allergies: No Known Allergies          Medications Prior to Admission  Medication Sig Dispense Refill  . cyclobenzaprine (FLEXERIL) 10 MG tablet Take 1  tablet (10 mg total) by mouth 3 (three) times daily as needed for muscle spasms. 30 tablet 0  . gabapentin (NEURONTIN) 100 MG capsule Can start with one at bedtime and increase to 3 times per day over next 1 week as tolerated. 30 capsule 0  . meloxicam (MOBIC) 7.5 MG tablet Take 1 tablet (7.5 mg total) by mouth daily. 30 tablet 0    Home: Home Living Family/patient expects to be discharged to:: Private residence Living Arrangements: Non-relatives/Friends Available Help at Discharge: Friend(s), Family, Available PRN/intermittently Type of Home: House Home Access: Stairs to enter [employer] of Steps: about 3 Entrance Stairs-Rails: Right, Left Home Layout: Two level, Able to live on main level with bedroom/bathroom Alternate Level Stairs-Number of Steps: 17-20 Alternate Level Stairs-Rails: Left Bathroom Shower/Tub: Tub/shower unit, Architectural technologist: Standard Bathroom Accessibility: Yes Home Equipment: None  Functional History: Prior Function Level of Independence: Independent Comments: Pt works as a Training and development officer at [employer], was driving  Functional Status:  Mobility: Bed Mobility Overal bed mobility: Needs Assistance Bed Mobility: Supine to Sit Supine to sit: Mod assist (for LEs) Sit to supine: Min guard, HOB elevated General bed mobility comments: pt use roll of Upper Body to assist drag of bil LE's to EOB and Min trunk assist up via R elbow; some use of handrail Transfers Overall transfer level: Needs assistance Transfers: Sit to/from Stand, Squat Pivot Transfers Sit to Stand: Max assist, +2 physical assistance Squat pivot transfers: Total assist, +2 physical assistance General transfer comment: pt assists significantly with UE's, but no assist from LE's  Ambulation/Gait General Gait Details: not able  ADL: ADL Overall ADL's : Needs assistance/impaired Eating/Feeding: Sitting, Set up Grooming: Wash/dry face, Set up, Sitting Grooming Details  (indicate cue type and reason): supported sitting, increased assist for unsupported  Upper Body Bathing: Min guard, Sitting Upper Body Bathing Details (indicate cue type and reason): supported sitting, increased assist for unsupported  Lower Body Bathing: Moderate assistance, Bed level Upper Body Dressing : Min guard, Sitting Upper Body Dressing Details (indicate cue type and reason): supported sitting, increased assist for unsupported  Lower Body Dressing: Total assistance, Bed level Lower Body Dressing Details (indicate cue type and reason): donning socks Toilet Transfer: Total assistance Toileting- Clothing Manipulation and Hygiene: Total assistance, Bed level Toileting - Clothing Manipulation Details (indicate cue type and reason): foley Functional mobility during ADLs: Maximal assistance, +2 for physical assistance, +2 for safety/equipment General ADL Comments: Focus of session on bed mobility and functional mobility transfers, education provided to Pt on pressure relief strategies while sitting up in chair   Cognition: Cognition Overall Cognitive Status: Within Functional Limits for tasks assessed Orientation Level: Oriented X4 Cognition Arousal/Alertness: Awake/alert Behavior During Therapy: WFL for tasks assessed/performed Overall Cognitive Status: Within Functional Limits for tasks assessed  Blood pressure 136/75, pulse 84, temperature 98.5 F (36.9 C), temperature source Oral, resp. rate 18, height 5' (1.524 m), weight 131.7 kg (290 lb 6.4 oz), SpO2 98 %. Physical Exam  Nursing note and vitals reviewed. Constitutional: She is oriented to person, place, and time. She appears well-developed. No distress.  Obese  HENT:  Head: Normocephalic and atraumatic.  Mouth/Throat: Oropharynx is clear and moist.  Eyes: Conjunctivae are normal. Pupils are equal, round, and reactive to light.  Horizontal nystagmus to left field with dysconjugate gaze left eye on confrontation.   Neck:  Normal range of motion. Neck supple.  Cardiovascular: Normal rate and regular rhythm.   Respiratory: Effort normal and breath sounds normal. No stridor. No respiratory distress. She has no wheezes.  GI: Soft. Bowel sounds are normal. She exhibits no distension. There is no tenderness.  Musculoskeletal: She exhibits no edema or tenderness.  Neurological: She is alert and oriented to person, place, and time.  Speech clear.  Cognition intact.  Appropriate and interactive.  Motor: B/l UE: 5/5 proximal to distal. B/l LE: 0/5 proximal to distal Decreased sensation distal to hip.  DTRs 1+ b/l LE.  Skin: Skin is warm and dry. No rash noted. She is not diaphoretic. No erythema.  Psychiatric: She has a normal mood and affect. Her behavior is normal. Judgment and thought content normal.    Lab Results Last 24 Hours  Results for orders placed or performed during the hospital encounter of 12/20/16 (from the past 24 hour(s))  Glucose, capillary     Status: Abnormal   Collection Time: 12/27/16 11:59 AM  Result Value Ref Range   Glucose-Capillary 125 (H) 65 - 99 mg/dL  Glucose, capillary     Status: Abnormal   Collection Time: 12/27/16  4:40 PM  Result Value Ref Range   Glucose-Capillary 116 (H) 65 - 99 mg/dL  Glucose, capillary     Status: Abnormal   Collection Time: 12/27/16  9:49 PM  Result Value Ref Range   Glucose-Capillary 130 (H) 65 - 99 mg/dL  Glucose, capillary     Status: Abnormal   Collection Time: 12/28/16  6:20 AM  Result Value Ref Range   Glucose-Capillary 145 (H) 65 - 99 mg/dL     Imaging Results (Last 48 hours)  No results found.    Assessment/Plan: Diagnosis: Transverse myelitis Labs and images independently reviewed.  Records reviewed and summated above.     Skin: daily skin checks, turn q2, PRAFO, continue use     pressure relieving mattress      Cardiovascular: anticipate orthostasis when OOB. May use     abdominal     binder, TEDs or ace wraps to BLE for  this. If ineffective, consider salt     tabs,     midodrine or fludrocortisone.       Extremities:. pt is at risk for flexion contractures, especially the hip. Continue ROM.     Psych: psychology consult for adjustment to disability for pt and family     Spasticity: may develop spasticity. Manage spasticity only if indicated     (pain, hygiene, prevention of contractures, functional impairment).     Pain Management:  control with oral medications if possible     Bladder:  Monitor for neurogenic bladder as when spasticity starts to develop. May confirm this with serial PVRs to r/o retention/atonic bladder. Consider in/out clean catherization. Implement bladder program . Encourage self I&O cath training vs     indwelling foley if possible to improve mobility, reduce infection, and     increase safety     Bowel: Implement mechanical and chemical bowel program and care  training with scheduled suppository 30 min to 1 hour after meals to utilize gastrocolic and colorectal reflexes.    1. Does the need for close, 24 hr/day medical supervision in concert with the patient's rehab needs make it unreasonable for this patient to be  served in a less intensive setting? Yes 2. Co-Morbidities requiring supervision/potential complications: morbid obesity (Body mass index is 56.71 kg/m., diet and exercise education, encourage weight loss to increase endurance and promote overall health), DDD with lumbar stenosis (plan to follow up as outpt), DM (Monitor in accordance with exercise and adjust meds as necessary) 3. Due to bladder management, bowel management, safety, skin/wound care, disease management, pain management and patient education, does the patient require 24 hr/day rehab nursing? Yes 4. Does the patient require coordinated care of a physician, rehab nurse, PT (1-2 hrs/day, 5 days/week) and OT (1-2 hrs/day, 5 days/week) to address physical and functional deficits in the context of the above medical  diagnosis(es)? Yes Addressing deficits in the following areas: balance, endurance, locomotion, strength, transferring, bowel/bladder control, bathing, dressing, toileting and psychosocial support 5. Can the patient actively participate in an intensive therapy program of at least 3 hrs of therapy per day at least 5 days per week? Yes 6. The potential for patient to make measurable gains while on inpatient rehab is excellent 7. Anticipated functional outcomes upon discharge from inpatient rehab are min assist  with PT, min assist with OT, n/a with SLP. 8. Estimated rehab length of stay to reach the above functional goals is: 10-15 days. 9. Anticipated D/C setting: Home 10. Anticipated post D/C treatments: HH therapy and Home excercise program 11. Overall Rehab/Functional Prognosis: good  RECOMMENDATIONS: This patient's condition is appropriate for continued rehabilitative care in the following setting: CIR Patient has agreed to participate in recommended program. Yes Note that insurance prior authorization may be required for reimbursement for recommended care.  Comment: Rehab Admissions Coordinator to follow up.  Delice Lesch, MD, Mellody Drown [place] [place], [place] 12/28/2016    Revision History              Routing History
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
## 2016-12-29 NOTE — Progress Notes (Signed)
   Subjective: Currently, the patient has no complaints. Ready for continued rehab.  Objective: Vital signs in last 24 hours: Vitals:   12/28/16 1816 12/28/16 2100 12/29/16 0100 12/29/16 0507  BP: 134/71 (!) 136/93 107/72 119/76  Pulse: 89 95 100 87  Resp: 19 18 18 18   Temp: 98.7 F (37.1 C) 99 F (37.2 C) 98.9 F (37.2 C) 98.3 F (36.8 C)  TempSrc: Oral Oral Oral Oral  SpO2: 99% 98% 95% 95%  Weight:      Height:       Physical Exam: Physical Exam  Constitutional: She is oriented to person, place, and time. She appears well-developed and well-nourished. She is cooperative. No distress.  Eyes: EOM are normal.  Neck: Normal range of motion.  Cardiovascular: Normal rate, regular rhythm, intact distal pulses and normal pulses.   Pulmonary/Chest: Effort normal. No respiratory distress. Breasts are symmetrical.  Abdominal: Soft. Bowel sounds are normal. There is no tenderness.  Musculoskeletal: She exhibits no edema.  Neurological: She is alert and oriented to person, place, and time.  Full paraplegia below the hips. Sensation to light touch L>R, sensation to cold temp R>L, no pain sensation BLE.   Skin: Skin is warm and dry.   Medications: Scheduled Medications: . gabapentin  300 mg Oral TID  . heparin  5,000 Units Subcutaneous Q8H  . meloxicam  7.5 mg Oral Daily  . nystatin   Topical TID  . polyethylene glycol  17 g Oral BID   PRN Medications: acetaminophen, oxyCODONE-acetaminophen  Assessment/Plan: Pt is a 41 y.o. 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 Diagnostic w/u negative so far.  1) Transverse myelitis: MRI w/ extensive thoracic lesion, no other CNS involvement. CSF less convincing for MS. NMO antibody negative, EBV negative. Pending CMV PCR. - maintain foley for neurogenic bladder - PT/OT - For back pain: oxy-APAP  5-325mg   q4 prn+ gabapentin 300mg  TID  Dispo: CIR pending insurance approval  Holley Raring, MD Pager: [phone] (7AM-5PM) 12/29/2016, 6:31 AM

## 2016-12-29 NOTE — Progress Notes (Signed)
Pt discharging to CIR today; no further CSW needs.  CSW signing off.  Laveda Abbe LCSW [phone]

## 2016-12-29 NOTE — H&P (Signed)
Physical Medicine and Rehabilitation Admission H&P    Chief Complaint  Patient presents with  . Transverse myelitis    HPI: Traci [name] is a 41 y.o. female with history of morbid obesity, DDD with reports of left sciatica over past couple of months and one week history of pain LLE with numbness tingling. She developed sudden onset of LLE weakness spreading to RLE with burning and BLE instability. She was admitted on 12/20/16 with BLE numbness, band like pain in bilateral lumbar region, urinary incontinence, and reports of fevers. MRI Tspine reviewed, showing increased signal T-spine and distal.  Per report, hyperintense signal abnormality within mid thoracic cord to conus medullaris concerning for demyelination, moderate L4-5 disc extrusion with mild canal stenosis affecting L5 nerve, moderate right L4/5 neural foraminal narrowing. MRI brain without white matter lesion or abnormal enhancement. She was found to have decreased rectal tone as well as urinary retention. Neurology felt that symptoms due to transverse myelitis with appearance most consistent with inflammatory v/s viral and patient treated with IV solumedrol X 5 days as well as empiric IV acyclovir.  LP done showing elevated WBC 106 and glucose 92. CSF negative for oligoclonal bands.  Dr. Arnoldo Morale consulted for input and felt that LLE pain due to L4/5 moderate stenosis and that this could be addressed in future if necessary. She had minimal improvement with steroids and treated with IVIG X 5 days. Serum NMO-IgG, EBV PCR and CMV PCR pending. 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 She has had some improvement in sensation BLE but continues to have paraplegia with substantial decline in mobility as well as ability to carry out ADL tasks. She is able to sit at EOB for 20 minutes and stand with significant assist. Therapy ongoing and CIR recommended for follow up therapy   Review of Systems  Constitutional:  Negative for chills, fever and malaise/fatigue.  HENT: Negative for hearing loss and tinnitus.   Eyes: Negative for blurred vision and double vision.  Respiratory: Positive for cough. Negative for sputum production and shortness of breath.   Cardiovascular: Negative for chest pain and palpitations.  Gastrointestinal: Negative for abdominal pain, heartburn and nausea.       Incontinent of bowel due to lack of sensation. Reports cramping sensation now--question bladder?   Genitourinary: Negative for frequency and urgency.  Musculoskeletal: Negative for back pain and myalgias.  Skin: Negative for itching and rash.  Neurological: Positive for sensory change and focal weakness. Negative for dizziness and headaches.  Psychiatric/Behavioral: Negative for memory loss. The patient does not have insomnia.       Past Medical History:  Diagnosis Date  . H/O degenerative disc disease   . Obesity     Past Surgical History:  Procedure Laterality Date  . fibroid tumor    . KNEE ARTHROSCOPY    . TUBAL LIGATION      Family History  Problem Relation Age of Onset  . Diabetes Mother   . 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 She lives with a roommate (and her daughter).  Family lives OOT and will not be able to help after discharge. She reports that she has never smoked. She has never used smokeless tobacco. She reports that she does not drink alcohol or use drugs.    Allergies: No Known Allergies    Medications Prior to Admission  Medication Sig Dispense Refill  . cyclobenzaprine (FLEXERIL) 10 MG  tablet Take 1 tablet (10 mg total) by mouth 3 (three) times daily as needed for muscle spasms. 30 tablet 0  . gabapentin (NEURONTIN) 100 MG capsule Can start with one at bedtime and increase to 3 times per day over next 1 week as tolerated. 30 capsule 0  . meloxicam (MOBIC) 7.5 MG tablet Take 1 tablet (7.5 mg total) by mouth daily. 30 tablet 0     Home: Home Living Family/patient expects to be discharged to:: Private residence Living Arrangements:  (Lives with roommate for 5 years; 3 children live in [place]) Available Help at Discharge: Friend(s), Family, Available PRN/intermittently Type of Home: House Home Access: Stairs to enter Technical brewer of Steps: about 3 Entrance Stairs-Rails: Right, Left Home Layout: Two level, Able to live on main level with bedroom/bathroom, 1/2 bath on main level Alternate Level Stairs-Number of Steps: 17-20 Alternate Level Stairs-Rails: Left Bathroom Shower/Tub:  (1/2 bath downstairs) Bathroom Toilet: Standard Bathroom Accessibility: Yes Home Equipment: None  Lives With: Friend(s)   Functional History: Prior Function Level of Independence: Independent Comments: Pt works as a Training and development officer at [employer], was driving   Functional Status:  Mobility: Bed Mobility Overal bed mobility: Needs Assistance Bed Mobility: Supine to Sit, Sit to Supine Supine to sit: Mod assist Sit to supine: Mod assist General bed mobility comments: Pt able to achieve sitting EOB with HOB elevated and use of rails for support. Mod assist was provided for LE movement. Bed pad utilized for scooting assist as well.  Transfers Overall transfer level: Needs assistance Transfer via Lift Equipment: Marketing executive Transfers: Sit to/from Stand Sit to Stand: Total assist, +2 safety/equipment, +2 physical assistance Squat pivot transfers: Total assist, +2 physical assistance General transfer comment: Clarise Cruz Plus utilized for sit<>stand x2. Pt was able to stand with improved posture and hold for ~30 seconds first stand and ~20 seconds second stand.  Ambulation/Gait General Gait Details: Unable at this time.     ADL: ADL Overall ADL's : Needs assistance/impaired Eating/Feeding: Sitting, Set up Grooming: Wash/dry face, Set up, Bed level Grooming Details (indicate cue type and reason): supported sitting, increased  assist for unsupported  Upper Body Bathing: Min guard, Sitting Upper Body Bathing Details (indicate cue type and reason): supported sitting, increased assist for unsupported  Lower Body Bathing: Moderate assistance, Bed level Upper Body Dressing : Min guard, Sitting Upper Body Dressing Details (indicate cue type and reason): supported sitting, increased assist for unsupported  Lower Body Dressing: Total assistance, Bed level Lower Body Dressing Details (indicate cue type and reason): donning socks Toilet Transfer: Total assistance Toileting- Clothing Manipulation and Hygiene: Total assistance, Bed level Toileting - Clothing Manipulation Details (indicate cue type and reason): foley Functional mobility during ADLs: Maximal assistance, +2 for physical assistance, +2 for safety/equipment General ADL Comments: Pt completed bed level grooming ADLs, session included BUE HEP  Cognition: Cognition Overall Cognitive Status: Within Functional Limits for tasks assessed Orientation Level: Oriented X4 Cognition Arousal/Alertness: Awake/alert Behavior During Therapy: WFL for tasks assessed/performed Overall Cognitive Status: Within Functional Limits for tasks assessed General Comments: Pt tearful towards end of session, when speaking of her family; family is not local, though Pt does have support from roommate locally    Blood pressure 125/80, pulse (!) 101, temperature 98.3 F (36.8 C), temperature source Oral, resp. rate 20, height 5' (1.524 m), weight 131.7 kg (290 lb 6.4 oz), SpO2 97 %. Physical Exam  Nursing note and vitals reviewed. Constitutional: She is oriented to person, place, and time. She appears  well-developed and well-nourished. No distress.  Obese female. Pleasant and appropriate  HENT:  Head: Normocephalic and atraumatic.  Mouth/Throat: Oropharynx is clear and moist.  Eyes: Conjunctivae and EOM are normal. Pupils are equal, round, and reactive to light.  Neck: Normal range of  motion. Neck supple.  Cardiovascular: Normal rate and regular rhythm.   Respiratory: Effort normal and breath sounds normal. No stridor. No respiratory distress. She has no wheezes.  GI: Soft. Bowel sounds are normal. She exhibits no distension. There is no tenderness.  Genitourinary:  Genitourinary Comments: Foley in place  Musculoskeletal: She exhibits no edema or tenderness.  Neurological: She is alert and oriented to person, place, and time.  Speech clear. Follows commands without difficulty. No evidence of emerging tone. No clonus. UE motor 5/5 prox to distal. LE: 0/5 HF, KE and ADF/PF. Sensation decreased to LT/Pain below inguinal area. Does sense gross touch. Cognitively intact.   Skin: Skin is warm and dry. She is not diaphoretic.  Psychiatric: She has a normal mood and affect. Her speech is normal and behavior is normal. Judgment and thought content normal. Cognition and memory are normal.    Results for orders placed or performed during the hospital encounter of 12/20/16 (from the past 48 hour(s))  Glucose, capillary     Status: Abnormal   Collection Time: 12/27/16  4:40 PM  Result Value Ref Range   Glucose-Capillary 116 (H) 65 - 99 mg/dL  Glucose, capillary     Status: Abnormal   Collection Time: 12/27/16  9:49 PM  Result Value Ref Range   Glucose-Capillary 130 (H) 65 - 99 mg/dL  Glucose, capillary     Status: Abnormal   Collection Time: 12/28/16  6:20 AM  Result Value Ref Range   Glucose-Capillary 145 (H) 65 - 99 mg/dL  Glucose, capillary     Status: Abnormal   Collection Time: 12/28/16 11:49 AM  Result Value Ref Range   Glucose-Capillary 140 (H) 65 - 99 mg/dL  Glucose, capillary     Status: Abnormal   Collection Time: 12/28/16  5:02 PM  Result Value Ref Range   Glucose-Capillary 108 (H) 65 - 99 mg/dL  Glucose, capillary     Status: Abnormal   Collection Time: 12/28/16  9:28 PM  Result Value Ref Range   Glucose-Capillary 125 (H) 65 - 99 mg/dL  Glucose, capillary      Status: Abnormal   Collection Time: 12/29/16  6:32 AM  Result Value Ref Range   Glucose-Capillary 133 (H) 65 - 99 mg/dL   Comment 1 Notify RN    Comment 2 Document in Chart   Glucose, capillary     Status: Abnormal   Collection Time: 12/29/16 11:24 AM  Result Value Ref Range   Glucose-Capillary 153 (H) 65 - 99 mg/dL   Comment 1 Notify RN    Comment 2 Document in Chart    No results found.     Medical Problem List and Plan: 1.  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  DVT Prophylaxis/Anticoagulation: Pharmaceutical: Lovenox 3. Pain Management: paraesthesias controlled on Neurontin tid and Mobic daily.  4. Mood: LCSW to follow for evaluation and support.  5. Neuropsych: This patient is capable of making decisions on her own behalf. 6. Skin/Wound Care: Educate on importance of boosting and pressure relief measures.  7. Fluids/Electrolytes/Nutrition: Monitor I/O. Check lytes in am. 8. T2DM: new diagnosis with Hgb A1C-6.9 at admission. BS likely higher  due to prednisone-- Monitor BS ac/hs--SSI for now.  Educate patient on CM diet.  9. Neurogenic bowel:  Incontinent of bowel twice a day per patient. Change miralax to plain Senna and schedule suppository daily in am. 10. Morbid obesity: Educate on appropriate diet. Will require bariatric equipment as well as skin monitoring to prevent breakdown.   11. Neurogenic bladder: foley in place at present. Voiding trial as appropriate    Post Admission Physician Evaluation: 1. Functional deficits secondary  to transverse myelitis. 2. Patient is admitted to receive collaborative, interdisciplinary care between the physiatrist, rehab nursing staff, and therapy team. 3. Patient's level of medical complexity and substantial therapy needs in context of that medical necessity cannot be provided at a lesser intensity of care such as a SNF. 4. Patient has  experienced substantial functional loss from his/her baseline which was documented above under the "Functional History" and "Functional Status" headings.  Judging by the patient's diagnosis, physical exam, and functional history, the patient has potential for functional progress which will result in measurable gains while on inpatient rehab.  These gains will be of substantial and practical use upon discharge  in facilitating mobility and self-care at the household level. 5. Physiatrist will provide 24 hour management of medical needs as well as oversight of the therapy plan/treatment and provide guidance as appropriate regarding the interaction of the two. 6. The Preadmission Screening has been reviewed and patient status is unchanged unless otherwise stated above. 7. 24 hour rehab nursing will assist with bladder management, bowel management, safety, skin/wound care, disease management, medication administration, pain management and patient education  and help integrate therapy concepts, techniques,education, etc. 8. PT will assess and treat for/with: Lower extremity strength, range of motion, stamina, balance, functional mobility, safety, adaptive techniques and equipment, NMR, spinal cord injury education.   Goals are: mod I to min assist 9. OT will assess and treat for/with: ADL's, functional mobility, safety, upper extremity strength, adaptive techniques and equipment, NMR, family education, ego support, pain mgt.   Goals are: mod I to min assist. Therapy may proceed with showering this patient. 10. SLP will assess and treat for/with: n/a.  Goals are: n/a. 11. Case Management and Social Worker will assess and treat for psychological issues and discharge planning. 12. Team conference will be held weekly to assess progress toward goals and to determine barriers to discharge. 13. Patient will receive at least 3 hours of therapy per day at least 5 days per week. 14. ELOS: 21-28 days       15. Prognosis:   excellent     Meredith Staggers, MD, [facility] 12/29/2016  Bary Leriche, Hershal Coria 12/29/2016

## 2016-12-29 NOTE — Care Management Note (Signed)
Case Management Note  Patient Details  Name: Traci [name] MRN: [id number] Date of Birth: 1975-10-16  Subjective/Objective:                    Action/Plan: Patient is discharging to CIR today. No further needs per CM.  Expected Discharge Date:  12/28/16               Expected Discharge Plan:  Home/Self Care  In-House Referral:  Clinical Social Work  Discharge planning Services  CM Consult  Post Acute Care Choice:    Choice offered to:     DME Arranged:    DME Agency:     HH Arranged:    HH Agency:     Status of Service:  Completed, signed off  If discussed at [employer], dates discussed:    Additional Comments:  Pollie Friar, RN 12/29/2016, 2:25 PM
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
## 2016-12-29 NOTE — Progress Notes (Signed)
I await insurance approval to possibly admit pt to inpt rehab today pending insurance approval. [phone]

## 2016-12-29 NOTE — Progress Notes (Signed)
Subjective:  The patient appears well and in no acute distress.  No significant changes in sensation or motor function since the ending of IVIG therapy today.  The patient does not report any worsening symptoms; no SOB or other neurologic deficits.  We discussed plans for CIR and therapy going forward.  The patient understood the plan and was in agreement.    Objective: Vital signs in last 24 hours: Vitals:   12/28/16 2100 12/29/16 0100 12/29/16 0507 12/29/16 1002  BP: (!) 136/93 107/72 119/76 125/80  Pulse: 95 100 87 (!) 101  Resp: 18 18 18 20   Temp: 99 F (37.2 C) 98.9 F (37.2 C) 98.3 F (36.8 C) 98.3 F (36.8 C)  TempSrc: Oral Oral Oral Oral  SpO2: 98% 95% 95% 97%  Weight:      Height:        Intake/Output Summary (Last 24 hours) at 12/29/16 1011 Last data filed at 12/29/16 0843  Gross per 24 hour  Intake              180 ml  Output             2400 ml  Net            -2220 ml    General appearance: cooperative, no distress and moderately obese Lungs: clear to auscultation bilaterally Heart: regular rate and rhythm, S1, S2 normal, no murmur, click, rub or gallop Neurologic: Mental status: Alert, oriented, thought content appropriate Cranial nerves: normal Sensory: Low sensation to temperature in both legs, substantial loss of sensation to light touch bilaterally with left foot slightly better than the right Motor: 0/5 BLE, normal otherwise   Lab Results:   Ref Range & Units 06:32 (12/29/16) 1d ago (12/28/16) 1d ago (12/28/16) 1d ago (12/28/16)    Glucose-Capillary 65 - 99 mg/dL 133   125   108   140     Comment 1  Notify RN       Comment 2  Document in Chart        CSF Results: Appearance-- Pink and cloudy RBC -- 3550 WBC --106 Protein -- 36 Glucose -- 92 Oligoclonal bands -- Zero IgG -- Normal IgA -- Normal HSV PCR -- Negative VZV PCR-- negative Pathologic Smear Review -- no malignant cells identified  Neuromyelitis optica Ab test -- Negative EBV PCR  -- Negative CMV PCR -- processing   Studies/Results: MRI's significant for demyelination in the thoracic segment with no demyelination in the cervical or head MRI.  Impression "Multiple sclerosis is unlikely in the absence of brain lesions. 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 Scheduled Meds: . gabapentin  300 mg Oral TID  . heparin  5,000 Units Subcutaneous Q8H  . meloxicam  7.5 mg Oral Daily  . nystatin   Topical TID  . polyethylene glycol  17 g Oral BID   Continuous Infusions:  PRN Meds:.acetaminophen, oxyCODONE-acetaminophen Assessment/Plan: Principal Problem:   Transverse myelitis (HCC) Active Problems:   Steroid-induced hyperglycemia   Bilateral leg weakness   CNS demyelination (HCC)   Myelitis (HCC)   Paresthesia of both lower extremities   Morbid obesity ([place])   Spinal stenosis of lumbar region   DDD (degenerative disc disease), lumbosacral   Diabetes mellitus type 2 in obese ([place])   Paraplegia [facility])  Traci [name] is a 41 yo woman with a PMHx significant for DDD who presented to the ED with  paraplegia, most likely associated with transverse myelitis, though origin is still unkown.      Acute Transverse Myelitis: The patient completed IVIG therapy today and there have been no significant sensory or motor improvements since the beginning of treatment.  EBV PCR of the CSF came back negative, and the Neuromyelitis optica antibody test was also negative.  We are still waiting on the CMV PCR, but treatment at this point will likely focus of rehab regardless.  This will be down in the CIR once insurance approves and there is a bed available.  Plan:  - Foley is maintained while the patient has urinary incontinence - PT/OT continued for rehab - q4h neuro-check -CIR referral in process.  Waiting on insurance and bed availability   Back  pain and sciatic: The patient has a hx of back pain and sciatica that is now well controlled.   Plan: Continue gabapentin and oxy  Constipation: The patient has had multiple bowel movements.   Plan:  Continue mirilax as needed.  Add docusate to keep regular    Steroid induced hyperglycemia: The patient had a BG has been well controlled without insulin now that steroid therapy is complete.  Initial A1C is still concerning for DM2 but that will need to be confirmed by another A1C in 3 months.   Plan:  -continue sliding scale insulin to control hypergycemia.  -check A1C in 3 months  Dispo:  Will go to CIR pending insurance and bed availability   This is a Careers information officer Note.  The care of the patient was discussed with Dr. Lynnae January and the assessment and plan formulated with their assistance.  Please see their attached note for official documentation of the daily encounter.   LOS: 9 days   Traci [name], Medical Student 12/29/2016, 10:11 AM

## 2016-12-30 ENCOUNTER — Inpatient Hospital Stay (HOSPITAL_COMMUNITY): Payer: PRIVATE HEALTH INSURANCE | Admitting: Physical Therapy

## 2016-12-30 ENCOUNTER — Inpatient Hospital Stay (HOSPITAL_COMMUNITY): Payer: PRIVATE HEALTH INSURANCE

## 2016-12-30 ENCOUNTER — Inpatient Hospital Stay (HOSPITAL_COMMUNITY): Payer: PRIVATE HEALTH INSURANCE | Admitting: Occupational Therapy

## 2016-12-30 DIAGNOSIS — E1169 Type 2 diabetes mellitus with other specified complication: Secondary | ICD-10-CM

## 2016-12-30 DIAGNOSIS — M7989 Other specified soft tissue disorders: Secondary | ICD-10-CM

## 2016-12-30 DIAGNOSIS — E669 Obesity, unspecified: Secondary | ICD-10-CM

## 2016-12-30 LAB — COMPREHENSIVE METABOLIC PANEL
ALBUMIN: 2.8 g/dL — AB (ref 3.5–5.0)
ALK PHOS: 48 U/L (ref 38–126)
ALT: 64 U/L — AB (ref 14–54)
AST: 49 U/L — AB (ref 15–41)
Anion gap: 5 (ref 5–15)
BILIRUBIN TOTAL: 1.1 mg/dL (ref 0.3–1.2)
BUN: 15 mg/dL (ref 6–20)
CALCIUM: 8.9 mg/dL (ref 8.9–10.3)
CO2: 24 mmol/L (ref 22–32)
CREATININE: 0.63 mg/dL (ref 0.44–1.00)
Chloride: 101 mmol/L (ref 101–111)
GFR calc Af Amer: >60 mL/min (ref >60)
GLUCOSE: 153 mg/dL — AB (ref 65–99)
POTASSIUM: 4.9 mmol/L (ref 3.5–5.1)
Sodium: 130 mmol/L — ABNORMAL LOW (ref 135–145)
TOTAL PROTEIN: 9 g/dL — AB (ref 6.5–8.1)

## 2016-12-30 LAB — CBC WITH DIFFERENTIAL/PLATELET
BASOS ABS: 0 10*3/uL (ref 0.0–0.1)
BASOS PCT: 0 %
Eosinophils Absolute: 0.2 10*3/uL (ref 0.0–0.7)
Eosinophils Relative: 2 %
HEMATOCRIT: 40.2 % (ref 36.0–46.0)
HEMOGLOBIN: 12.9 g/dL (ref 12.0–15.0)
LYMPHS PCT: 27 %
Lymphs Abs: 2.4 10*3/uL (ref 0.7–4.0)
MCH: 25.7 pg — ABNORMAL LOW (ref 26.0–34.0)
MCHC: 32.1 g/dL (ref 30.0–36.0)
MCV: 80.1 fL (ref 78.0–100.0)
MONO ABS: 0.9 10*3/uL (ref 0.1–1.0)
Monocytes Relative: 10 %
NEUTROS ABS: 5.4 10*3/uL (ref 1.7–7.7)
NEUTROS PCT: 61 %
Platelets: 199 10*3/uL (ref 150–400)
RBC: 5.02 MIL/uL (ref 3.87–5.11)
RDW: 15.1 % (ref 11.5–15.5)
WBC: 8.9 10*3/uL (ref 4.0–10.5)

## 2016-12-30 LAB — GLUCOSE, CAPILLARY
GLUCOSE-CAPILLARY: 147 mg/dL — AB (ref 65–99)
Glucose-Capillary: 147 mg/dL — ABNORMAL HIGH (ref 65–99)
Glucose-Capillary: 133 mg/dL — ABNORMAL HIGH (ref 65–99)
Glucose-Capillary: 118 mg/dL — ABNORMAL HIGH (ref 65–99)

## 2016-12-30 LAB — URINALYSIS, COMPLETE (UACMP) WITH MICROSCOPIC
BILIRUBIN URINE: NEGATIVE
Glucose, UA: 50 mg/dL — AB
Hgb urine dipstick: NEGATIVE
Ketones, ur: NEGATIVE mg/dL
Nitrite: POSITIVE — AB
PH: 9 — AB (ref 5.0–8.0)
Protein, ur: 30 mg/dL — AB
SPECIFIC GRAVITY, URINE: 1.018 (ref 1.005–1.030)
SQUAMOUS EPITHELIAL / LPF: NONE SEEN

## 2016-12-30 MED ORDER — TRAMADOL HCL 50 MG PO TABS
50.0000 mg | ORAL_TABLET | ORAL | Status: DC | PRN
  Administered 2016-12-31 – 2017-01-25 (×30): 50 mg via ORAL
  Filled 2016-12-30 (×30): qty 1

## 2016-12-30 MED ORDER — RIVAROXABAN 15 MG PO TABS
15.0000 mg | ORAL_TABLET | Freq: Two times a day (BID) | ORAL | Status: AC
  Administered 2016-12-30 – 2017-01-20 (×42): 15 mg via ORAL
  Filled 2016-12-30 (×43): qty 1

## 2016-12-30 MED ORDER — INSULIN ASPART 100 UNIT/ML ~~LOC~~ SOLN
0.0000 [IU] | Freq: Three times a day (TID) | SUBCUTANEOUS | Status: DC
  Administered 2016-12-30 – 2016-12-31 (×4): 1 [IU] via SUBCUTANEOUS
  Administered 2016-12-31: 2 [IU] via SUBCUTANEOUS
  Administered 2017-01-01 – 2017-01-04 (×7): 1 [IU] via SUBCUTANEOUS
  Administered 2017-01-06: 2 [IU] via SUBCUTANEOUS
  Administered 2017-01-07 – 2017-01-09 (×3): 1 [IU] via SUBCUTANEOUS
  Administered 2017-01-10: 2 [IU] via SUBCUTANEOUS
  Administered 2017-01-12 – 2017-01-15 (×2): 1 [IU] via SUBCUTANEOUS
  Administered 2017-01-16: 2 [IU] via SUBCUTANEOUS
  Administered 2017-01-17: 1 [IU] via SUBCUTANEOUS
  Administered 2017-01-20: 2 [IU] via SUBCUTANEOUS
  Administered 2017-01-21 – 2017-01-26 (×6): 1 [IU] via SUBCUTANEOUS
  Administered 2017-01-27 (×2): 2 [IU] via SUBCUTANEOUS

## 2016-12-30 MED ORDER — ACETAMINOPHEN 325 MG PO TABS
650.0000 mg | ORAL_TABLET | Freq: Four times a day (QID) | ORAL | Status: DC
  Administered 2016-12-30 – 2017-01-05 (×26): 650 mg via ORAL
  Filled 2016-12-30 (×26): qty 2

## 2016-12-30 MED ORDER — INSULIN ASPART 100 UNIT/ML ~~LOC~~ SOLN: 0.0000 [IU] | Freq: Every day | SUBCUTANEOUS | Status: DC

## 2016-12-30 NOTE — Progress Notes (Signed)
Patient information reviewed and entered into eRehab system by Donie Moulton, RN, CRRN, PPS Coordinator.  Information including medical coding and functional independence measure will be reviewed and updated through discharge.    

## 2016-12-30 NOTE — Progress Notes (Addendum)
*  Preliminary Results* Bilateral lower extremity venous duplex completed. Right lower extremity is negative for deep vein thrombosis. The left lower extremity is positive for acute deep vein thrombosis involving the left peroneal veins. There is no evidence of Baker's cyst bilaterally.  Preliminary discussed with Santiago Glad, RN.  12/30/2016 3:04 PM Maudry Mayhew, BS, RVT, RDCS, RDMS

## 2016-12-30 NOTE — IPOC Note (Signed)
Overall Plan of Care [facility]) Patient Details Name: Chesley Valls MRN: [id number] DOB: 08-08-1976  Admitting Diagnosis: transverse myelilis  Hospital Problems: Active Problems:   Transverse myelitis [facility])   Thoracic myelopathy   Neurogenic bladder   Neurogenic bowel     Functional Problem List: Nursing Bladder, Medication Management, Sensory, Skin Integrity, Bowel  PT Balance, Edema, Endurance, Motor, Pain, Safety, Sensory, Skin Integrity  OT Balance, Endurance, Motor  SLP    TR         Basic ADL's: OT Grooming, Bathing, Dressing, Toileting     Advanced  ADL's: OT Simple Meal Preparation     Transfers: PT Bed Mobility, Bed to Chair, Car, Furniture, Floor  OT Toilet, Metallurgist: PT Ambulation, Emergency planning/management officer, Stairs     Additional Impairments: OT None  SLP        TR      Anticipated Outcomes Item Anticipated Outcome  Self Feeding independent  Swallowing      Basic self-care  min assist level  Toileting  min assist level   Bathroom Transfers min assist level  Bowel/Bladder   (Has neurogenic bladder. If unable to regain bladder function will need to learn to use urinary catheters.)  Transfers  min assist   Locomotion  supervision with WC.   Communication     Cognition     Pain   (Not currently having significant problems with pain.)  Safety/Judgment   (Appears to be aware of her current limitations.)   Therapy Plan: PT Intensity: Minimum of 1-2 x/day ,45 to 90 minutes PT Frequency: 5 out of 7 days PT Duration Estimated Length of Stay: 21-28 days  OT Intensity: Minimum of 1-2 x/day, 45 to 90 minutes OT Frequency: 5 out of 7 days OT Duration/Estimated Length of Stay: 16-18days         Team Interventions: Nursing Interventions Patient/Family Education, Bladder Management, Disease Management/Prevention, Medication Management, Discharge Planning, Skin Care/Wound Management, Bowel Management  PT interventions Ambulation/gait  training, Balance/vestibular training, Cognitive remediation/compensation, Community reintegration, Discharge planning, Functional mobility training, Disease management/prevention, Neuromuscular re-education, Pain management, Patient/family education, Psychosocial support, Skin care/wound management, Stair training, Therapeutic Activities, Therapeutic Exercise, UE/LE Strength taining/ROM, UE/LE Coordination activities, Visual/perceptual remediation/compensation, Wheelchair propulsion/positioning, Functional electrical stimulation, DME/adaptive equipment instruction, Splinting/orthotics  OT Interventions Training and development officer, Academic librarian, Engineer, drilling, Discharge planning, Patient/family education, Functional mobility training, Neuromuscular re-education, Psychosocial support, Therapeutic Activities, Self Care/advanced ADL retraining, Pain management, UE/LE Strength taining/ROM  SLP Interventions    TR Interventions    SW/CM Interventions Discharge Planning, Psychosocial Support, Patient/Family Education    Team Discharge Planning: Destination: PT-  ,OT- Home , SLP-  Projected Follow-up: PT- , OT-  Home health OT, SLP-  Projected Equipment Needs: PT- , OT- To be determined, SLP-  Equipment Details: PT- , OT-  Patient/family involved in discharge planning: PT- Patient,  OT-Patient, SLP-   MD ELOS: 16-19 days. Medical Rehab Prognosis:  Good Assessment: 41 y.o. female with history of morbid obesity, DDD with reports of left sciatica over past couple of months and one week history of pain LLE with numbness tingling. She developed sudden onset of LLE weakness spreading to RLE with burning and BLE instability. She was admitted on 12/20/16 with BLE numbness, band like pain in bilateral lumbar region, urinary incontinence, and reports of fevers. MRI Tspine showing increased signal T-spine and distal, hyperintense signal abnormality within mid thoracic cord to conus  medullaris concerning for demyelination, moderate L4-5 disc extrusion with mild canal stenosis  affecting L5 nerve, moderate right L4/5 neural foraminal narrowing. MRI brain without white matter lesion or abnormal enhancement. She was found to have decreased rectal tone as well as urinary retention. Neurology felt that symptoms due to transverse myelitis with appearance most consistent with inflammatory v/s viral and patient treated with IV solumedrol X 5 days as well as empiric IV acyclovir.  LP done showing elevated WBC 106 and glucose 92. CSF negative for oligoclonal bands.Dr. Arnoldo Morale consulted for input and felt that LLE pain due to L4/5 moderate stenosis and that this could be addressed in future if necessary. She had minimal improvement with steroids and treated with IVIG X 5 days. Serum NMO-IgG, EBV PCR and CMV PCR pending. 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 She has had some improvement in sensation BLE but continues to have paraplegia with substantial decline in mobility as well as ability to carry out ADL tasks. Will set goals for Min A with most tasks with PT/OT, supervision for wheelchair mobility.  See Team Conference Notes for weekly updates to the plan of care

## 2016-12-30 NOTE — Evaluation (Signed)
Physical Therapy Assessment and Plan  Patient Details  Name: Traci [name] MRN: [id number] Date of Birth: 14-Mar-1976  PT Diagnosis: Impaired sensation, Muscle weakness, Paraplegia and Paralysis Rehab Potential: Good ELOS: 21-28 days    Today's Date: 12/15/2016 PT Individual Time:800-915 AND 1500-1600   Calculated time  75mn AND 60 min   Problem List:  Patient Active Problem List   Diagnosis Date Noted  . Thoracic myelopathy   . Neurogenic bladder   . Neurogenic bowel   . Bilateral leg weakness   . CNS demyelination ([place]   . Myelitis ([place]   . Paresthesia of both lower extremities   . Morbid obesity ([place]   . Spinal stenosis of lumbar region   . DDD (degenerative disc disease), lumbosacral   . Diabetes mellitus type 2 in obese ([place]   . Paraplegia ([place]   . Transverse myelitis ([place] 12/25/2016  . Steroid-induced hyperglycemia 12/25/2016    Past Medical History:  Past Medical History:  Diagnosis Date  . H/O degenerative disc disease   . Obesity    Past Surgical History:  Past Surgical History:  Procedure Laterality Date  . fibroid tumor    . KNEE ARTHROSCOPY    . TUBAL LIGATION      Assessment & Plan Clinical Impression: Patient is a 41y.o.femalewith history of morbid obesity, DDD with reports of left sciatica over past couple of months and one week history of pain LLE with numbness tingling. She developed sudden onset of LLE weakness spreading to RLE with burning and BLE instability. She was admitted on 12/20/16 with BLE numbness, band like pain in bilateral lumbar region, urinary incontinence,and reports of fevers. MRI Tspine reviewed, showing increased signal T-spine and distal. Per report, hyperintense signal abnormality within mid thoracic cord to conus medullaris concerning for demyelination, moderate L4-5 disc extrusion with mild canal stenosis affecting L5 nerve, moderate right L4/5 neural foraminal narrowing. MRI brain without white matter lesion or abnormal  enhancement. She was found to have decreased rectal tone as well as urinary retention. Neurology felt that symptoms due to transverse myelitis with appearance most consistent with inflammatory v/s viral and patient treated with IV solumedrol X 5 days as well as empiric IV acyclovir. LP done showing elevated WBC 106 and glucose 92. CSF negative for oligoclonal bands.  Dr. JArnoldo Moraleconsulted for input and felt that LLE pain due to L4/5 moderate stenosis and that this could be addressed in future if necessary. She had minimal improvement with steroids and treated with IVIG X 5 days. Serum NMO-IgG, EBV PCR and CMV PCR pending. 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 She has had some improvement in sensation BLE but continues to have paraplegia with substantial decline in mobility as well as ability to carry out ADL tasks. She is able to sit at EOB for 20 minutes and stand with significant assist.   Patient transferred to CIR on 12/29/2016 .   Patient currently requires max with mobility secondary to muscle weakness and muscle paralysis, decreased cardiorespiratoy endurance, abnormal tone and decreased sitting balance, decreased standing balance, decreased postural control and decreased balance strategies.  Prior to hospitalization, patient was independent  with mobility and lived with   in a House home.  Home access is about 3Stairs to enter.  Patient will benefit from skilled PT intervention to maximize safe functional mobility, minimize fall risk and decrease caregiver burden for planned discharge home with 24 hour assist.  Anticipate patient will benefit from [facility].  PT - End of Session Activity Tolerance: Tolerates 10 - 20 min activity with multiple rests Endurance Deficit: Yes PT Assessment Rehab Potential (ACUTE/IP ONLY): Good Barriers to Discharge: Inaccessible home environment;Decreased caregiver support PT Patient demonstrates impairments in the  following area(s): Balance;Edema;Endurance;Motor;Pain;Safety;Sensory;Skin Integrity PT Transfers Functional Problem(s): Bed Mobility;Bed to Chair;Car;Furniture;Floor PT Locomotion Functional Problem(s): Ambulation;Wheelchair Mobility;Stairs PT Plan PT Intensity: Minimum of 1-2 x/day ,45 to 90 minutes PT Frequency: 5 out of 7 days PT Duration Estimated Length of Stay: 21-28 days  PT Treatment/Interventions: Ambulation/gait training;Balance/vestibular training;Cognitive remediation/compensation;Community reintegration;Discharge planning;Functional mobility training;Disease management/prevention;Neuromuscular re-education;Pain management;Patient/family education;Psychosocial support;Skin care/wound management;Stair training;Therapeutic Activities;Therapeutic Exercise;UE/LE Strength taining/ROM;UE/LE Coordination activities;Visual/perceptual remediation/compensation;Wheelchair propulsion/positioning;Functional electrical stimulation;DME/adaptive equipment instruction;Splinting/orthotics PT Transfers Anticipated Outcome(s): min assist  PT Locomotion Anticipated Outcome(s): supervision with WC.  PT Recommendation Recommendations for Other Services: Neuropsych consult;Therapeutic Recreation consult Therapeutic Recreation Interventions: Stress management;Pet therapy;Kitchen group  Skilled Therapeutic Intervention  PT instructed patient in PT Evaluation and initiated treatment intervention; see below for results. PT educated patient in [place], rehab potential, rehab goals, and discharge recommendations. Pt required max assist for bed mobility to come to sitting EOB and return to Supine. Mod assist for rolling. Pt also required  total assist for transfers via lift. Following treatment pt left sitting in WC with call bell in reach.    Session 2.  Pt received supine in bed and agreeable to PT. Pt found to have had incontinent bowel movement . Rolling R and L with Min assist to control one  LE and heavy use of  rails. Pt required to perform perineal hygiene . Transfer to [facility] with Maxi move. Pt transported to rehab gym in [facility]  Arm bike forward 2 min, backward 2 min x2. Prolonged rest break between sets due to increased fatigue. Pt instructed in sit<>stand and standing tolerance in the standing frame. 5 minutes standing with 6 inch step under feet due to short stature and improved positioning in standing frame. Pt able to scoot forward and backward in WC with min assist and [place] of arm rails. No Orthostatic symptoms in standing.   Patient returned too room and left sitting in [facility] with call bell in reach and all needs met.          PT Evaluation Precautions/Restrictions  fall. paraplegia General   Vital Signs  Pain Pain Assessment Pain Assessment: No/denies pain Pain Score: 0-No pain Home Living/Prior Functioning Home Living Living Arrangements: Non-relatives/Friends Available Help at Discharge: Other (Comment);Available PRN/intermittently;Friend(s) (pt not totally sure) Type of Home: House Home Access: Stairs to enter [employer] of Steps: about 3 Entrance Stairs-Rails: Right;Left Home Layout: Two level;Able to live on main level with bedroom/bathroom Alternate Level Stairs-Number of Steps: 17-20 Alternate Level Stairs-Rails: Left Bathroom Shower/Tub: Tub/shower unit;Curtain Bathroom Toilet: Standard Bathroom Accessibility: Yes Prior Function Level of Independence: Independent with basic ADLs  Able to Take Stairs?: Yes Vocation: Full time employment Comments: Pt works as a Training and development officer at [employer], was driving  Vision/Perception  Geologist, engineering: Within Advertising copywriter Praxis Praxis: Intact  Cognition Overall Cognitive Status: Within Functional Limits for tasks assessed Orientation Level: Oriented X4 Memory: Appears intact Awareness: Appears intact Problem Solving: Appears intact Safety/Judgment: Appears intact Sensation Sensation Light Touch: Impaired  Detail Light Touch Impaired Details: Impaired RLE;Impaired LLE Proprioception: Impaired Detail Proprioception Impaired Details: Impaired RLE;Impaired LLE Additional Comments: deep pressure intact BLE expect L foot.  Coordination Gross Motor Movements are Fluid and Coordinated: No Fine Motor Movements are Fluid and Coordinated: Yes Coordination and Movement Description: Paraplegia BLE  Motor  Motor Motor: Paraplegia Motor - Skilled Clinical Observations: no active ROM in BLE   Mobility Bed Mobility Bed Mobility: Rolling Right;Rolling Left;Sit to Supine;Supine to Sit Rolling Right: 4: Min assist Rolling Right Details: Verbal cues for technique;Verbal cues for precautions/safety;Manual facilitation for placement;Verbal cues for safe use of DME/AE Rolling Left: 4: Min assist Rolling Left Details: Verbal cues for safe use of DME/AE;Manual facilitation for weight shifting;Manual facilitation for placement;Verbal cues for technique;Verbal cues for precautions/safety Supine to Sit: 2: Max assist Supine to Sit Details: Verbal cues for technique;Verbal cues for precautions/safety;Manual facilitation for weight shifting;Manual facilitation for placement;Verbal cues for safe use of DME/AE;Tactile cues for posture;Tactile cues for placement Sit to Supine: 3: Mod assist Sit to Supine - Details: Verbal cues for gait pattern;Verbal cues for safe use of DME/AE;Manual facilitation for weight shifting;Manual facilitation for placement;Verbal cues for precautions/safety;Verbal cues for technique Transfers Transfers: Yes Transfer via Lift Equipment: English as a second language teacher: Yes Wheelchair Assistance: 4: Advertising account executive Details: Verbal cues for technique;Tactile cues for placement;Verbal cues for safe use of DME/AE Wheelchair Propulsion: Both upper extremities Wheelchair Parts Management: Needs assistance Distance: 122f   Trunk/Postural Assessment   Cervical Assessment Cervical Assessment: Within Functional Limits Thoracic Assessment Thoracic Assessment: Within Functional Limits Lumbar Assessment Lumbar Assessment: Exceptions to WFL (decreased lumber ROM) Postural Control Postural Control: Deficits on evaluation (requires BUE support to maintain sitting balance. )  Balance Balance Balance Assessed: Yes Static Sitting Balance Static Sitting - Level of Assistance: 5: Stand by assistance Dynamic Sitting Balance Dynamic Sitting - Level of Assistance: 4: Min assist Sitting balance - Comments: Pt requires  BUE support to maintain sitting balance. min assist from PT to prevent LOB with onle 1 UE support  Extremity Assessment      RLE Assessment RLE Assessment: Exceptions to [facility] Active movement in BLE. limited hip flexion PROM due to body habitus) LLE Assessment LLE Assessment: Exceptions to [facility](no active movement in LLE, decreased hip flexion PROM due to body habitus)   See Function Navigator for Current Functional Status.   Refer to Care Plan for Long Term Goals  Recommendations for other services: Neuropsych and Therapeutic Recreation  Pet therapy, Kitchen group and Stress management  Discharge Criteria: Patient will be discharged from PT if patient refuses treatment 3 consecutive times without medical reason, if treatment goals not met, if there is a change in medical status, if patient makes no progress towards goals or if patient is discharged from hospital.  The above assessment, treatment plan, treatment alternatives and goals were discussed and mutually agreed upon: by patient  ALorie Phenix5/16/2018, 3:38 PM

## 2016-12-30 NOTE — Progress Notes (Signed)
[facility] PHYSICAL MEDICINE & REHABILITATION     PROGRESS NOTE  Subjective/Complaints:  Pt seen sitting up in bed this AM.  She slept well overnight and is ready to begin her day of therapies.   ROS: Denies CP, SOB, N/V/D.  Objective: Vital Signs: Blood pressure 136/71, pulse 86, temperature 98.1 F (36.7 C), temperature source Oral, resp. rate 18, height 5' (1.524 m), weight 119 kg (262 lb 6.4 oz), SpO2 98 %. No results found. No results for input(s): WBC, HGB, HCT, PLT in the last 72 hours. No results for input(s): NA, K, CL, GLUCOSE, BUN, CREATININE, CALCIUM in the last 72 hours.  Invalid input(s): CO CBG (last 3)   Recent Labs  12/29/16 1643 12/29/16 2035 12/30/16 0601  GLUCAP 114* 121* 147*    Wt Readings from Last 3 Encounters:  12/29/16 119 kg (262 lb 6.4 oz)  12/25/16 131.7 kg (290 lb 6.4 oz)  12/17/16 127.9 kg (282 lb)    Physical Exam:  BP 136/71 (BP Location: Right Wrist)   Pulse 86   Temp 98.1 F (36.7 C) (Oral)   Resp 18   Ht 5' (1.524 m)   Wt 119 kg (262 lb 6.4 oz)   SpO2 98%   BMI 51.25 kg/m  Constitutional: She appears well-developed. No distress. Obese.  HENT: Normocephalicand atraumatic.  Eyes: EOMI. No discharge.  Cardiovascular: Normal rateand regular rhythm. No JVD. Respiratory: Effort normaland breath sounds normal.  GI: Soft. Bowel sounds are normal.   Genitourinary Foley in place Musculoskeletal: She exhibits no edemaor tenderness.  Neurological: She is alertand oriented.  Speech clear.  Follows commands without difficulty.  Motor: B/l UE motor 5/5 prox to distal.  B/l LE: 0/5 HF, KE and ADF/PF.  Sensation diminished to LT below inguinal area.  Skin: Skin is warmand dry. She is not diaphoretic.  Psychiatric: She has a normal mood and affect. Her speech is normaland behavior is normal. Judgmentand thought contentnormal. Cognition and memoryare normal.    Assessment/Plan: 1. 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 Physiatrist is providing close team supervision and 24 hour management of active medical problems listed below. Physiatrist and rehab team continue to assess barriers to discharge/monitor patient progress toward functional and medical goals.  Function:  Bathing Bathing position      Bathing parts      Bathing assist        Upper Body Dressing/Undressing Upper body dressing                    Upper body assist        Lower Body Dressing/Undressing Lower body dressing                                  Lower body assist        Toileting Toileting          Toileting assist     Transfers Chair/bed transfer             Locomotion Ambulation           Wheelchair          Cognition Comprehension    Expression    Social Interaction    Problem Solving    Memory      Medical Problem List and Plan: 1.  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  DVT Prophylaxis/Anticoagulation: Pharmaceutical: Lovenox 3. Pain Management: paraesthesias controlled on Neurontin tid and Mobic daily.  4. Mood: LCSW to follow for evaluation and support.  5. Neuropsych: This patient is capable of making decisions on her own behalf. 6. Skin/Wound Care: Educate on importance of boosting and pressure relief measures.  7. Fluids/Electrolytes/Nutrition: Monitor I/O.   Labs pending this AM 8. T2DM: new diagnosis with Hgb A1C-6.9 at admission.   Monitor BS ac/hs--SSI for now.    Educate patient on CM diet.  9. Neurogenic bowel:  Incontinent of bowel twice a day per patient. Changed miralax to Senna and schedule suppository daily in am. 10. Morbid obesity: Educate on appropriate diet. Will require bariatric equipment as well as skin monitoring to prevent breakdown.   11. Neurogenic  bladder: foley in place at present. D/ced with voiding trial  UA/Ucx ordered   LOS (Days) 1 A FACE TO FACE EVALUATION WAS PERFORMED  Lorenzo Arscott Lorie Phenix 12/30/2016 8:15 AM

## 2016-12-30 NOTE — Patient Care Conference (Signed)
Inpatient RehabilitationTeam Conference and Plan of Care Update Date: 12/30/2016   Time: 2:20 PM    Patient Name: Traci [name]      Medical Record Number: [id number]  Date of Birth: 10/21/1975 Sex: Female         Room/Bed: 4M08C/4M08C-01 Payor Info: Payor: MEDCOST / Plan: MEDCOST / Product Type: *No Product type* /    Admitting Diagnosis: transverse myelilis  Admit Date/Time:  12/29/2016  5:26 PM Admission Comments: No comment available   Primary Diagnosis:  <principal problem not specified> Principal Problem: <principal problem not specified>  Patient Active Problem List   Diagnosis Date Noted  . Thoracic myelopathy   . Neurogenic bladder   . Neurogenic bowel   . Bilateral leg weakness   . CNS demyelination ([place])   . Myelitis ([place])   . Paresthesia of both lower extremities   . Morbid obesity ([place])   . Spinal stenosis of lumbar region   . DDD (degenerative disc disease), lumbosacral   . Diabetes mellitus type 2 in obese ([place])   . Paraplegia ([place])   . Transverse myelitis ([place]) 12/25/2016  . Steroid-induced hyperglycemia 12/25/2016    Expected Discharge Date:    Team Members Present: Physician leading conference: Dr. Delice Lesch Social Worker Present: Ovidio Kin, LCSW Nurse Present: Rayetta Pigg, RN PT Present: Other (comment) Gwenith Spitz) OT Present: Clyda Greener, OT SLP Present: Windell Moulding, SLP PPS Coordinator present : Daiva Nakayama, RN, CRRN     Current Status/Progress Goal Weekly Team Focus  Medical   Paraplegia and sensory deficits secondary to Transverse Myelitis involving thoracic and lumbar cord  Improve mobility, transfers, safety, bowel/bladder, DM  See above   Bowel/Bladder   Incontinent of bowel. Bisacodyl suppositories and sennokot dailey. BM- 5/15. Foley cath for neurogenic bladder.  Maintain regularity of Bowel.  Keep patient clean and dry.   Swallow/Nutrition/ Hydration             ADL's     eval pending        Mobility     eval pending         Communication   Pain 0-4. Tylenol and Robxin effective.  Keep pain at <4.  Monitor for pain and admin PRN's when needed.   Safety/Cognition/ Behavioral Observations            Pain   Pain 0-4. Tylenol and Robaxin effective.  Keep pain <4.  Monitor for pain.   Skin   No skin breakdown.  Continue with no skin breakdown.  Monitor skin.      *See Care Plan and progress notes for long and short-term goals.  Barriers to Discharge: DM, neurogenic bowel/bladder, obesity, mobility, transfers    Possible Resolutions to Barriers:  Therapies, optimize bowel/bladder meds, follwo CBGs    Discharge Planning/Teaching Needs:    Wants to go home with roommate and her daughter's. Needs to be mod/i wheelchair level to be able to do this, does not have 24 hr care. Looking at options.     Team Discussion:  New eval setting goals. Back pain an issue-MD prescribing pain meds. Foley dc today voiding trial started. ELOS 2-3 weeks  Revisions to Treatment Plan:  New eval   Continued Need for Acute Rehabilitation Level of Care: The patient requires daily medical management by a physician with specialized training in physical medicine and rehabilitation for the following conditions: Daily direction of a multidisciplinary physical rehabilitation program to ensure safe treatment while eliciting the highest outcome that is of practical  value to the patient.: Yes Daily medical management of patient stability for increased activity during participation in an intensive rehabilitation regime.: Yes Daily analysis of laboratory values and/or radiology reports with any subsequent need for medication adjustment of medical intervention for : Neurological problems;Diabetes problems;Other  Jermain Curt, Gardiner Rhyme 12/30/2016, 3:01 PM

## 2016-12-30 NOTE — Care Management Note (Signed)
[facility] Individual Statement of Services  Patient Name:  Traci [name]  Date:  12/30/2016  Welcome to the [facility].  Our goal is to provide you with an individualized program based on your diagnosis and situation, designed to meet your specific needs.  With this comprehensive rehabilitation program, you will be expected to participate in at least 3 hours of rehabilitation therapies Monday-Friday, with modified therapy programming on the weekends.  Your rehabilitation program will include the following services:  Physical Therapy (PT), Occupational Therapy (OT), 24 hour per day rehabilitation nursing, Therapeutic Recreaction (TR), Neuropsychology, Case Management (Social Worker), Rehabilitation Medicine, Nutrition Services and Pharmacy Services  Weekly team conferences will be held on Wednesday to discuss your progress.  Your Social Worker will talk with you frequently to get your input and to update you on team discussions.  Team conferences with you and your family in attendance may also be held.  Expected length of stay: 18-25 days Overall anticipated outcome:min assist wheelchair level  Depending on your progress and recovery, your program may change. Your Social Worker will coordinate services and will keep you informed of any changes. Your Social Worker's name and contact numbers are listed  below.  The following services may also be recommended but are not provided by the [facility] will be made to provide these services after discharge if needed.  Arrangements include referral to agencies that provide these services.  Your insurance has been verified to be:  [place] Your primary doctor is:  [facility]  Pertinent information will be shared with your doctor and your  insurance company.  Social Worker:  Ovidio Kin, [place] or ([phone]  Information discussed with and copy given to patient by: [name], 12/30/2016, 12:45 PM

## 2016-12-30 NOTE — Progress Notes (Signed)
Social Work Assessment and Plan Social Work Assessment and Plan  Patient Details  Name: Traci [name] MRN: [id number] Date of Birth: 1975-12-04  Today's Date: 12/30/2016  Problem List:  Patient Active Problem List   Diagnosis Date Noted  . Thoracic myelopathy   . Neurogenic bladder   . Neurogenic bowel   . Bilateral leg weakness   . CNS demyelination ([place])   . Myelitis ([place])   . Paresthesia of both lower extremities   . Morbid obesity ([place])   . Spinal stenosis of lumbar region   . DDD (degenerative disc disease), lumbosacral   . Diabetes mellitus type 2 in obese ([place])   . Paraplegia ([place])   . Transverse myelitis ([place]) 12/25/2016  . Steroid-induced hyperglycemia 12/25/2016   Past Medical History:  Past Medical History:  Diagnosis Date  . H/O degenerative disc disease   . Obesity    Past Surgical History:  Past Surgical History:  Procedure Laterality Date  . fibroid tumor    . KNEE ARTHROSCOPY    . TUBAL LIGATION     Social History:  reports that she has never smoked. She has never used smokeless tobacco. She reports that she does not drink alcohol or use drugs.  Family / Support Systems Marital Status: Single Patient Roles: Parent, Other (Comment), Caregiver (employee) Children: Three children-one grown and 12 & 18 being raised by her sister in [place] Other Supports: Lynnda Shields [phone] Anticipated Caregiver: Roommate Ability/Limitations of Caregiver: Roommate works as Arts administrator works long hours-pt does not have 24 hr care Caregiver Availability: Evenings only Family Dynamics: Pt's family is 2 1/2 hours away her sister is raising her younger children-48 & 18 and also running a Chiropractor. She does have nine siblings which she is the ninth and her parent;s are living-but elderly and have health issues. She has no plans to go back there but plans to stay here with her roommate and  Social History Preferred language:  English Religion: None Cultural Background: Pt is [place] Education: [employer] Read: Yes Write: Yes Employment Status: Employed Name of Employer: Programmer, systems of Employment: 5 Return to Work Plans: Plans to return if she is able too, currently on break until Aug Legal Hisotry/Current Legal Issues: No issues Guardian/Conservator: None-according to MD pt is capable of making her own decisions while here   Abuse/Neglect Physical Abuse: Denies Verbal Abuse: Denies Sexual Abuse: Denies Exploitation of patient/patient's resources: Denies Self-Neglect: Denies  Emotional Status Pt's affect, behavior adn adjustment status: Pt is motivated to begin her rehab, she wants to see what she will be able to do and is hoping she will get feeling back in her legs. She is trying to stay positive and keep her head up, but is aware she may feel down at times and is ok with that. Recent Psychosocial Issues: had back issues but managed and remained independent. Long standing back issues from injury when age 33 Pyschiatric History: No issues probably would benefit from seeing neuro-psych while here due to major life change in a short period of time. Will ask team for their input.  Substance Abuse History: No issues  Patient / Family Perceptions, Expectations & Goals Pt/Family understanding of illness & functional limitations: Pt is able to explain her condition and talks with the MD's that come and see her about this. She wants to read up on it also. Will ask RN & PA to provide information on this for pt. Premorbid pt/family roles/activities: Employee, Building control surveyor,  roommate, Mom, friend, sibling, etc Anticipated changes in roles/activities/participation: resume Pt/family expectations/goals: Pt states: " I want to be able to take care of myself and be able to walk before I leave here."    [employer]: None Premorbid Home Care/DME Agencies:  None Transportation available at discharge: Roommate and roommate's daughter Resource referrals recommended: Neuropsychology, Support group (specify)  Discharge Planning Living Arrangements: Non-relatives/Friends Support Systems: Children, Armed forces technical officer, Other relatives, Water engineer, Social worker community Type of Residence: Private residence Insurance Resources: Multimedia programmer (specify) Environmental education officer) Financial Resources: Employment, Other (Comment) (roommate's income) Museum/gallery curator Screen Referred: Yes Living Expenses: Rent Money Management: Patient Does the patient have any problems obtaining your medications?: No Home Management: Patient would do the home management Patient/Family Preliminary Plans: Return to apartment with roommate and roommate's two daughter's ages 12 & 4. She would assist roommate with her 65 yo picking up from after school and babysit her until roommate got home from work. pt will ned to be mod/i wheeclhair levle at the very least to be able to go home safely. She will not have 24 hr care but roommate is there in the evenings.  Social Work Anticipated Follow Up Needs: HH/OP, Support Group  Clinical Impression Pleasant motivated female who is trying to be optimistic and positive in her recovery process. She has always been independent and able to provide for herself and others, so this is a different role for her. Her goal is to stay here with her roommate and roommate's two daughter's and feels it is not an option to go back where her family lives, due to could not help her either. Will await team's evaluations and work on a safe discharge plan. Pt would benefit from seeing neuro-psych while here for coping.  [name] 12/30/2016, 1:08 PM

## 2016-12-30 NOTE — Evaluation (Signed)
Occupational Therapy Assessment and Plan  Patient Details  Name: Traci [name] MRN: [id number] Date of Birth: 04-Apr-1976  OT Diagnosis: muscle weakness (generalized) and paraparesis  Rehab Potential: Rehab Potential (ACUTE ONLY): Good ELOS: 16-18days   Today's Date: 12/30/2016 OT Individual Time: 1300-1400 OT Individual Time Calculation (min): 60 min     Problem List:  Patient Active Problem List   Diagnosis Date Noted  . Thoracic myelopathy   . Neurogenic bladder   . Neurogenic bowel   . Bilateral leg weakness   . CNS demyelination ([place])   . Myelitis ([place])   . Paresthesia of both lower extremities   . Morbid obesity ([place])   . Spinal stenosis of lumbar region   . DDD (degenerative disc disease), lumbosacral   . Diabetes mellitus type 2 in obese ([place])   . Paraplegia ([place])   . Transverse myelitis ([place]) 12/25/2016  . Steroid-induced hyperglycemia 12/25/2016    Past Medical History:  Past Medical History:  Diagnosis Date  . H/O degenerative disc disease   . Obesity    Past Surgical History:  Past Surgical History:  Procedure Laterality Date  . fibroid tumor    . KNEE ARTHROSCOPY    . TUBAL LIGATION      Assessment & Plan Clinical Impression: Patient is a 41 y.o. year old female with recent admission to the hospital on 12/20/16 with BLE numbness, band like pain in bilateral lumbar region, urinary incontinence,and reports of fevers. MRI Tspine reviewed, showing increased signal T-spine and distal. Per report, hyperintense signal abnormality within mid thoracic cord to conus medullaris concerning for demyelination, moderate L4-5 disc extrusion with mild canal stenosis affecting L5 nerve, moderate right L4/5 neural foraminal narrowing. MRI brain without white matter lesion or abnormal enhancement. She was found to have decreased rectal tone as well as urinary retention. Neurology felt that symptoms due to transverse myelitis with appearance most consistent with inflammatory v/s  viral and patient treated with IV solumedrol X 5 days as well as empiric IV acyclovir   Patient transferred to CIR on 12/29/2016 .    Patient currently requires total with basic self-care skills secondary to muscle weakness, unbalanced muscle activation and decreased coordination and decreased standing balance and decreased balance strategies.  Prior to hospitalization, patient could complete ADLs and IADLs with independent .  Patient will benefit from skilled intervention to decrease level of assist with basic self-care skills and increase independence with basic self-care skills prior to discharge home with care partner.  Anticipate patient will require 24 hour supervision and follow up home health.  OT - End of Session Activity Tolerance: Tolerates 30+ min activity with multiple rests Endurance Deficit: Yes OT Assessment Rehab Potential (ACUTE ONLY): Good Barriers to Discharge: Decreased caregiver support OT Patient demonstrates impairments in the following area(s): Balance;Endurance;Motor OT Basic ADL's Functional Problem(s): Grooming;Bathing;Dressing;Toileting OT Advanced ADL's Functional Problem(s): Simple Meal Preparation OT Transfers Functional Problem(s): Toilet;Tub/Shower OT Additional Impairment(s): None OT Plan OT Intensity: Minimum of 1-2 x/day, 45 to 90 minutes OT Frequency: 5 out of 7 days OT Duration/Estimated Length of Stay: 16-18days OT Treatment/Interventions: Balance/vestibular training;Community reintegration;DME/adaptive equipment instruction;Discharge planning;Patient/family education;Functional mobility training;Neuromuscular re-education;Psychosocial support;Therapeutic Activities;Self Care/advanced ADL retraining;Pain management;UE/LE Strength taining/ROM OT Self Feeding Anticipated Outcome(s): independent OT Basic Self-Care Anticipated Outcome(s): min assist level OT Toileting Anticipated Outcome(s): min assist level OT Bathroom Transfers Anticipated Outcome(s): min  assist level OT Recommendation Patient destination: Home Follow Up Recommendations: Home health OT Equipment Recommended: To be determined   Skilled Therapeutic Intervention Began working  on selfcare retraining sitting on the EOB.  Max assist for transfer from supine to sit EOB.  Once sitting on the EOB she was able to maintain balance with supervision while complete UB bathing.  Min assist for balance when therapist assisted her with crossing LEs to remove gripper socks.  Therapist assisted with washing feet as she was not able to reach them this session.  Total assist for sliding board transfer from bed to wheelchair to complete session.  Pt with increased posterior lean with attempted sliding board transfer instead of forward trunk flexion.  Finished session with pt in wheelchair with call button and phone in reach.    OT Evaluation Precautions/Restrictions  Precautions Precautions: Fall Precaution Comments: BLE paraparesis Restrictions Weight Bearing Restrictions: No  Pain Pain Assessment Pain Assessment: No/denies pain Pain Score: 0-No pain Home Living/Prior Functioning Home Living Living Arrangements: Non-relatives/Friends Available Help at Discharge: Friend(s) (Has roommate but may need to discharge back to her hometown in order to have 24 hr as roommate works. 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 Prior Function Level of Independence: Independent with basic ADLs  Able to  Take Stairs?: Yes Vocation: Full time employment Comments: Pt works as a Training and development officer at [employer], was driving  ADL  See Function section of chart for details  Vision Baseline Vision/History: Wears glasses Patient Visual Report: No change from baseline Vision Assessment?: No apparent visual deficits Perception  Perception: Within Functional Limits Praxis Praxis: Intact Cognition Overall Cognitive Status: Within Functional Limits for tasks assessed Arousal/Alertness: Awake/alert Orientation Level: Person;Place;Situation Person: Oriented Place: Oriented Situation: Oriented Year: 2018 Month: May Day of Week: Correct Memory: Appears intact Immediate Memory Recall: Sock;Blue;Bed Memory Recall: Sock;Blue;Bed Memory Recall Sock: Without Cue Memory Recall Blue: Without Cue Memory Recall Bed: Without Cue Attention: Alternating;Sustained;Selective Sustained Attention: Appears intact Selective Attention: Appears intact Awareness: Appears intact Problem Solving: Appears intact Safety/Judgment: Appears intact Sensation Sensation Additional Comments: Sensation intact in BUEs but limited in BLEs.  Able to detect deep pressure, but not able to recognize light touch below her hips Coordination Gross Motor Movements are Fluid and Coordinated: Yes Fine Motor Movements are Fluid and Coordinated: Yes Coordination and Movement Description: UE coordination WFLs, BLE paraparesis Motor  Motor Motor: Paraplegia Mobility  Bed Mobility Bed Mobility: Rolling Right;Rolling Left;Sit to Supine;Supine to Sit Rolling Right: 2: Max assist (No rail) Rolling Right Details: Tactile cues for sequencing;Manual facilitation for weight shifting Rolling Left: 2: Max assist (without rail) Rolling Left Details: Manual facilitation for weight shifting Supine to Sit: 2: Max assist  Trunk/Postural Assessment  Cervical Assessment Cervical Assessment: Within Functional Limits Thoracic Assessment Thoracic  Assessment: Within Functional Limits Lumbar Assessment Lumbar Assessment: Exceptions to [facility] (maintains posterior pelvic tilt)  Balance Static Sitting Balance Static Sitting - Level of Assistance: 5: Stand by assistance Dynamic Sitting Balance Dynamic Sitting - Level of Assistance: 4: Min assist Extremity/Trunk Assessment RUE Assessment RUE Assessment: Within Functional Limits (AROM and strength WFLs for selfcare tasks.) LUE Assessment LUE Assessment: Within Functional Limits (AROM and strength WFLS for selfcare tasks)   See Function Navigator for Current  Functional Status.   Refer to Care Plan for Long Term Goals  Recommendations for other services: Neuropsych   Discharge Criteria: Patient will be discharged from OT if patient refuses treatment 3 consecutive times without medical reason, if treatment goals not met, if there is a change in medical status, if patient makes no progress towards goals or if patient is discharged from hospital.  The above assessment, treatment plan, treatment alternatives and goals were discussed and mutually agreed upon: by patient  Katia Hannen OTR/L 12/30/2016, 4:33 PM

## 2016-12-31 ENCOUNTER — Inpatient Hospital Stay (HOSPITAL_COMMUNITY): Payer: PRIVATE HEALTH INSURANCE | Admitting: Occupational Therapy

## 2016-12-31 ENCOUNTER — Inpatient Hospital Stay (HOSPITAL_COMMUNITY): Payer: PRIVATE HEALTH INSURANCE | Admitting: Physical Therapy

## 2016-12-31 DIAGNOSIS — E871 Hypo-osmolality and hyponatremia: Secondary | ICD-10-CM

## 2016-12-31 DIAGNOSIS — N390 Urinary tract infection, site not specified: Secondary | ICD-10-CM

## 2016-12-31 DIAGNOSIS — I82452 Acute embolism and thrombosis of left peroneal vein: Secondary | ICD-10-CM

## 2016-12-31 LAB — GLUCOSE, CAPILLARY
GLUCOSE-CAPILLARY: 150 mg/dL — AB (ref 65–99)
Glucose-Capillary: 170 mg/dL — ABNORMAL HIGH (ref 65–99)
Glucose-Capillary: 122 mg/dL — ABNORMAL HIGH (ref 65–99)
Glucose-Capillary: 91 mg/dL (ref 65–99)

## 2016-12-31 MED ORDER — NITROFURANTOIN MONOHYD MACRO 100 MG PO CAPS: 100.0000 mg | ORAL_CAPSULE | Freq: Once | ORAL | Status: DC

## 2016-12-31 MED ORDER — NITROFURANTOIN MONOHYD MACRO 100 MG PO CAPS
100.0000 mg | ORAL_CAPSULE | Freq: Two times a day (BID) | ORAL | Status: DC
  Administered 2016-12-31 – 2017-01-02 (×6): 100 mg via ORAL
  Filled 2016-12-31 (×6): qty 1

## 2016-12-31 MED ORDER — RIVAROXABAN 20 MG PO TABS
20.0000 mg | ORAL_TABLET | Freq: Every day | ORAL | Status: DC
  Administered 2017-01-21 – 2017-01-26 (×6): 20 mg via ORAL
  Filled 2016-12-31 (×6): qty 1

## 2016-12-31 NOTE — Progress Notes (Signed)
Orthopedic Tech Progress Note Patient Details:  Traci [name] 1976/05/03 [id number]  Ortho Devices Type of Ortho Device:  (prafo) Ortho Device/Splint Location: bilateral Ortho Device/Splint Interventions: Application   Candis Kabel 12/31/2016, 11:30 AM

## 2016-12-31 NOTE — Progress Notes (Signed)
Occupational Therapy Session Note  Patient Details  Name: Sierah Lacewell MRN: [id number] Date of Birth: 1976-05-30  Today's Date: 12/31/2016 OT Individual Time: 1445-1531 OT Individual Time Calculation (min): 46 min    Short Term Goals: Week 1:  OT Short Term Goal 1 (Week 1): Pt will complete LB bathing sit to supine with mod assist.  OT Short Term Goal 2 (Week 1): Pt will donn pullup shorts/pants wiith mod assist in supine position rolling. OT Short Term Goal 3 (Week 1): Pt will complete sliding board transfer to the drop arm commode with max assist.   OT Short Term Goal 4 (Week 1): Pt complete toilet hygiene and clothing with max assist using lateral leans in sitting.   Skilled Therapeutic Interventions/Progress Updates:    Pt worked on use of AE for LB dressing.  Mod assist needed for LE support while attempting to remove gripper socks with the reacher.  Once therapist supported the leg, she was able to take the sock off with min instructional cueing.  Utilized sock aide for donning gripper socks with min assist as well to begin donning sock over the foot.  Completed wheelchair mobility to and from the therapy gym with supervision and rest breaks.  Had her complete 4 min interval of use with the UE ergonometer with resistance on level 12 and RPMs maintained at 25-30.  Returned to room with pt left up in wheelchair with call button and phone in reach and family present to visit.    Therapy Documentation Precautions:  Precautions Precautions: Fall Precaution Comments: BLE paraparesis Restrictions Weight Bearing Restrictions: No  Pain: No report of pain during session  ADL: See Function Navigator for Current Functional Status.   Therapy/Group: Individual Therapy  Shanekia Latella OTR/L 12/31/2016, 4:49 PM

## 2016-12-31 NOTE — Progress Notes (Signed)
Occupational Therapy Session Note  Patient Details  Name: Traci [name] MRN: [id number] Date of Birth: 1976-01-15  Today's Date: 12/31/2016 OT Individual Time: 4801-6553 OT Individual Time Calculation (min): 56 min    Short Term Goals: Week 1:  OT Short Term Goal 1 (Week 1): Pt will complete LB bathing sit to supine with mod assist.  OT Short Term Goal 2 (Week 1): Pt will donn pullup shorts/pants wiith mod assist in supine position rolling. OT Short Term Goal 3 (Week 1): Pt will complete sliding board transfer to the drop arm commode with max assist.   OT Short Term Goal 4 (Week 1): Pt complete toilet hygiene and clothing with max assist using lateral leans in sitting.   Skilled Therapeutic Interventions/Progress Updates:    Pt completed bathing supine to sit EOB during session.  Mod assist for rolling side to side with use of rail and UB strengthening with therapist assist to bend up LEs to assist with hip transition to the side.  Therapist assisted with peri washing and for donning new brief.  Mod assist for pt to transition to sitting from right sidelying to work on UB bathing.  Once sitting balance was established she could complete UB bathing with setup and donn hospital gown at the same level.  She hopes to have clothing from home by tomorrow.  Worked on Veterinary surgeon transfer to the wheelchair with max assist.  Therapist placed box under LEs secondary to chair being elevated and pt's feet not being able to reach the floor.  Max demonstrational cueing for head/hips relationship and for forward trunk flexion.  Once in the chair pt reported feeling nauseas and had one episode of vomiting.  She felt better after this and blamed it on just taking a medication a few mins earlier.  Pt left in wheelchair with call button and phone in reach.    Therapy Documentation Precautions:  Precautions Precautions: Fall Precaution Comments: BLE paraparesis Restrictions Weight Bearing Restrictions:  No  Pain: Pain Assessment Pain Assessment: No/denies pain Pain Score: 6  Pain Type: Acute pain Pain Location: Back Pain Orientation: Lower Pain Descriptors / Indicators: Aching Pain Frequency: Intermittent Pain Onset: On-going Pain Intervention(s): Medication (See eMAR) ADL: See Function Navigator for Current Functional Status.   Therapy/Group: Individual Therapy  Tea Collums OTR/L  12/31/2016, 10:01 AM

## 2016-12-31 NOTE — Progress Notes (Signed)
Social Work Patient ID: Traci [name], female   DOB: [birth year], 41 y.o.   MRN: [id number]  Met with pt who reports having a good day today. Her bladder seems to be working and she is trying to Anticipate when she needs to void. Discussed plan she is thinking about it and if she were to need assistance she will need to go where she can get assist, which would be her family.  Will continue to work discharge needs and provide support.

## 2016-12-31 NOTE — Progress Notes (Signed)
Physical Therapy Session Note  Patient Details  Name: Traci [name] MRN: [id number] Date of Birth: 1976-07-07  Today's Date: 12/31/2016 PT Individual Time: 1007-1105 PT Individual Time Calculation (min): 58 min   Short Term Goals: Week 1:  PT Short Term Goal 1 (Week 1): Pt will perform bed mobility with mod assist consistently PT Short Term Goal 2 (Week 1): Pt will propell WC >150 and supervision assist from PT  PT Short Term Goal 3 (Week 1): Pt will transfer to and from [facility] with assist of 1 consistently PT Short Term Goal 4 (Week 1): pt will maintian dynamic sitting balance with min assist and no UE support   Skilled Therapeutic Interventions/Progress Updates:    Session 1: Pt sitting in w/c upon arrival, agreeable to PT session. Propelling w/c 160 ft with supervision, rest breaks provided as needed (X3). Tendency to drift Rt. Transfers: w/c<>mat table - max assist using sliding board, 3/4 transfer completed but having to return to chair due to incontinence of bowel. W/C>bed performed with max assist and sliding board - cues for leaning forward and hand placement. Bed Mobility: mod assist sit>supine with bilateral LEs (pt able to control UE/trunk. Rolling performed to assist with cleaning (nursing assistance). Following session, pt in bed with all needs in reach.  Session 2: Pt in bed upon arrival, reports feeling much better this afternoon. Supine>sitting mod assist with LEs, pt able to press trunk up with rail. Sitting balance with supervision and UE support. Transfer: slide board bed>w/c with mod assist (decline). Cue for hand placement and technique. W/C-pt propelling w/c 100 ft with supervision. Total assist for leg rest management. Transfers: sit<>stand performed at standing frame for approx. 5 min. Alternating between single and double UE support. Dependent w/c transport to room due to time constraints. Pt in w/c with nursing present and all needs in reach after session.  Therapy  Documentation Precautions:  Precautions Precautions: Fall Precaution Comments: BLE paraparesis Restrictions Weight Bearing Restrictions: No  Pain: 3-4/10 low back, declines any pain meds. Monitored during session.    See Function Navigator for Current Functional Status.   Therapy/Group: Individual Therapy  Linard Millers, PT 12/31/2016, 12:36 PM

## 2016-12-31 NOTE — Progress Notes (Signed)
[place] PHYSICAL MEDICINE & REHABILITATION     PROGRESS NOTE  Subjective/Complaints:  Pt seen laying in bed this AM.  She slept well overnight and states she had a tiring day of therapies yesterday. She notes ability to void, but retention.   ROS: Denies CP, SOB, N/V/D.  Objective: Vital Signs: Blood pressure 128/76, pulse (!) 101, temperature 98.1 F (36.7 C), temperature source Oral, resp. rate 18, height 5' (1.524 m), weight 119 kg (262 lb 6.4 oz), SpO2 99 %. No results found.  Recent Labs  12/30/16 0732  WBC 8.9  HGB 12.9  HCT 40.2  PLT 199    Recent Labs  12/30/16 0732  NA 130*  K 4.9  CL 101  GLUCOSE 153*  BUN 15  CREATININE 0.63  CALCIUM 8.9   CBG (last 3)   Recent Labs  12/30/16 1607 12/30/16 2047 12/31/16 0620  GLUCAP 133* 118* 150*    Wt Readings from Last 3 Encounters:  12/29/16 119 kg (262 lb 6.4 oz)  12/25/16 131.7 kg (290 lb 6.4 oz)  12/17/16 127.9 kg (282 lb)    Physical Exam:  BP 128/76 (BP Location: Right Wrist)   Pulse (!) 101   Temp 98.1 F (36.7 C) (Oral)   Resp 18   Ht 5' (1.524 m)   Wt 119 kg (262 lb 6.4 oz)   SpO2 99%   BMI 51.25 kg/m  Constitutional: She appears well-developed. No distress. Obese.  HENT: Normocephalicand atraumatic.  Eyes: EOMI. No discharge.  Cardiovascular: RRR. No JVD. Respiratory: Effort normal breath sounds normal.  GI: Soft. Bowel sounds are normal.   Musculoskeletal: She exhibits no edemaor tenderness.  Neurological: She is alertand oriented.  Speech clear.  Follows commands without difficulty.  Motor: B/l UE motor 5/5 prox to distal.  B/l LE: 0/5 HF, KE and ADF/PF.  Sensation diminished to LT below inguinal area (unchanged).  Skin: Skin is warmand dry. She is not diaphoretic.  Psychiatric: She has a normal mood and affect. Her speech is normaland behavior is normal. Judgmentand thought contentnormal. Cognition and memoryare normal.    Assessment/Plan: 1. Functional deficits  secondary to Transverse Myelitis which require 3+ hours per day of interdisciplinary therapy in a comprehensive inpatient rehab setting. Physiatrist is providing close team supervision and 24 hour management of active medical problems listed below. Physiatrist and rehab team continue to assess barriers to discharge/monitor patient progress toward functional and medical goals.  Function:  Bathing Bathing position   Position: Bed (supine to sit EOB)  Bathing parts Body parts bathed by patient: Right arm, Left arm, Chest, Abdomen, Right upper leg, Left upper leg Body parts bathed by helper: Front perineal area, Buttocks, Right lower leg, Left lower leg, Back  Bathing assist        Upper Body Dressing/Undressing Upper body dressing   What is the patient wearing?: Hospital gown                Upper body assist Assist Level: Supervision or verbal cues      Lower Body Dressing/Undressing Lower body dressing   What is the patient wearing?: Hospital Gown, Non-skid slipper socks           Non-skid slipper socks- Performed by helper: Don/doff right sock, Don/doff left sock                  Lower body assist        Toileting Toileting Toileting activity did not occur: No continent bowel/bladder event (pt  wearing gown only)        Toileting assist     Transfers Chair/bed transfer   Chair/bed transfer method: Lateral scoot Chair/bed transfer assist level: 2 helpers Chair/bed transfer assistive device: Sliding board Mechanical lift: Maximove   Locomotion Ambulation Ambulation activity did not occur: Safety/medical concerns         Wheelchair     Max wheelchair distance: 144ft  Assist Level: Touching or steadying assistance (Pt > 75%)  Cognition Comprehension Comprehension assist level: Follows complex conversation/direction with no assist  Expression Expression assist level: Expresses complex ideas: With no assist  Social Interaction Social Interaction  assist level: Interacts appropriately with others - No medications needed.  Problem Solving Problem solving assist level: Solves complex problems: Recognizes & self-corrects  Memory Memory assist level: Complete Independence: No helper    Medical Problem List and Plan: 1.  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  DVT:  LLE DVT - Xarelto started 3. Pain Management: paraesthesias controlled on Neurontin tid and Mobic daily.   Tramadol PRN 4. Mood: LCSW to follow for evaluation and support.  5. Neuropsych: This patient is capable of making decisions on her own behalf. 6. Skin/Wound Care: Educate on importance of boosting and pressure relief measures.  7. Fluids/Electrolytes/Nutrition: Monitor I/O.  8. T2DM: new diagnosis with Hgb A1C-6.9 at admission.   Monitor BS ac/hs.    Educate patient on CM diet.   SSI for now  Will consider oral meds 9. Neurogenic bowel:  Incontinent of bowel twice a day per patient. Changed miralax to Senna and schedule suppository daily in am. 10. Morbid obesity: Educate on appropriate diet. Will require bariatric equipment as well as skin monitoring to prevent breakdown.   11. Neurogenic bladder: foley in place at present. D/ced with voiding trial 12. Acute lower UTI  Ucx pending  Empiric Macrobid 5/17-5/23 13. Hyponatremia  Na 130 on 5/16  Cont to monitor 14. Transaminitis  Cont to monitor   LOS (Days) 2 A FACE TO FACE EVALUATION WAS PERFORMED  Traci [name] 12/31/2016 8:18 AM

## 2017-01-01 ENCOUNTER — Inpatient Hospital Stay (HOSPITAL_COMMUNITY): Payer: PRIVATE HEALTH INSURANCE | Admitting: Occupational Therapy

## 2017-01-01 ENCOUNTER — Inpatient Hospital Stay (HOSPITAL_COMMUNITY): Payer: PRIVATE HEALTH INSURANCE | Admitting: Physical Therapy

## 2017-01-01 ENCOUNTER — Inpatient Hospital Stay (HOSPITAL_COMMUNITY): Payer: PRIVATE HEALTH INSURANCE

## 2017-01-01 DIAGNOSIS — R740 Nonspecific elevation of levels of transaminase and lactic acid dehydrogenase [LDH]: Secondary | ICD-10-CM

## 2017-01-01 DIAGNOSIS — R7401 Elevation of levels of liver transaminase levels: Secondary | ICD-10-CM | POA: Insufficient documentation

## 2017-01-01 LAB — GLUCOSE, CAPILLARY
Glucose-Capillary: 141 mg/dL — ABNORMAL HIGH (ref 65–99)
Glucose-Capillary: 112 mg/dL — ABNORMAL HIGH (ref 65–99)
Glucose-Capillary: 88 mg/dL (ref 65–99)
Glucose-Capillary: 97 mg/dL (ref 65–99)

## 2017-01-01 NOTE — Progress Notes (Signed)
Occupational Therapy Session Note  Patient Details  Name: Traci [name] MRN: [id number] Date of Birth: [birth year]  Today's Date: 01/01/2017 OT Individual Time: 3419-6222 OT Individual Time Calculation (min): 27 min    Short Term Goals: Week 1:  OT Short Term Goal 1 (Week 1): Pt will complete LB bathing sit to supine with mod assist.  OT Short Term Goal 2 (Week 1): Pt will donn pullup shorts/pants wiith mod assist in supine position rolling. OT Short Term Goal 3 (Week 1): Pt will complete sliding board transfer to the drop arm commode with max assist.   OT Short Term Goal 4 (Week 1): Pt complete toilet hygiene and clothing with max assist using lateral leans in sitting.   Skilled Therapeutic Interventions/Progress Updates:    Pt in bed during session.  Focused on simulated dressing tasks in long sitting with HOB elevated for support.  She was able to bring her LEs up to reach and donn and doff socks bilaterally in this position without use of any AE.  Also practiced simulated pants/shorts with use of brief, which she could thread over her LEs as well.  Next had pt work on transitioning from supine to long sitting with HOB decreased.  Pt able to complete several intervals from [facility] at 50 degrees all the way down to 8 degrees with increased time and supervision.  Pt left in bed at end of session with call button and phone in reach.    Therapy Documentation Precautions:  Precautions Precautions: Fall Precaution Comments: BLE paraparesis Restrictions Weight Bearing Restrictions: No  Pain: Pain Assessment Pain Assessment: No/denies pain Pain Score: 0-No pain  See Function Navigator for Current Functional Status.   Therapy/Group: Individual Therapy  Bradon Fester OTR/L 01/01/2017, 3:43 PM

## 2017-01-01 NOTE — Progress Notes (Signed)
Occupational Therapy Session Note  Patient Details  Name: Traci [name] MRN: [id number] Date of Birth: [birth year]  Today's Date: 01/01/2017 OT Individual Time: 1001-1102 OT Individual Time Calculation (min): 61 min    Short Term Goals: Week 1:  OT Short Term Goal 1 (Week 1): Pt will complete LB bathing sit to supine with mod assist.  OT Short Term Goal 2 (Week 1): Pt will donn pullup shorts/pants wiith mod assist in supine position rolling. OT Short Term Goal 3 (Week 1): Pt will complete sliding board transfer to the drop arm commode with max assist.   OT Short Term Goal 4 (Week 1): Pt complete toilet hygiene and clothing with max assist using lateral leans in sitting.   Skilled Therapeutic Interventions/Progress Updates:    Pt completed UB and LB bathing sitting supported in wheelchair at the sink.  Pt utilized LH sponge with min assist for washing feet as well as reacher for removing socks prior to washing, with min assist as well.  She completed all UB bathing and donning of new gown with supervision as well.  Sockaide utilized for donning gripper socks with min assist for getting it started over the foot.  Pt completed sliding board transfer from wheelchair back to the bed with total assist.  Worked on rolling in the bed with min assist using rails to clean up peri area and donn new brief.  Therapist provided max assist for cleaning up buttocks and for donning brief but pt assisted some with washing and drying front peri area.  Pt left in bed with call button and phone in reach to finish session.    Therapy Documentation Precautions:  Precautions Precautions: Fall Precaution Comments: BLE paraparesis Restrictions Weight Bearing Restrictions: No   Pain: Pain Assessment Pain Assessment: No/denies pain ADL: See Function Navigator for Current Functional Status.   Therapy/Group: Individual Therapy  Gwenn Teodoro OTR/L 01/01/2017, 12:27 PM

## 2017-01-01 NOTE — Progress Notes (Signed)
[facility] PHYSICAL MEDICINE & REHABILITATION     PROGRESS NOTE  Subjective/Complaints:  Pt seen laying in bed this AM.  She slept well overnight.  She has her PRAFOs in place. She continues to have [place] retention.  ROS: +Urinary retention. Denies CP, SOB, N/V/D.  Objective: Vital Signs: Blood pressure 138/73, pulse 97, temperature 98.9 F (37.2 C), temperature source Oral, resp. rate 18, height 5' (1.524 m), weight 119 kg (262 lb 6.4 oz), SpO2 99 %. No results found.  Recent Labs  12/30/16 0732  WBC 8.9  HGB 12.9  HCT 40.2  PLT 199    Recent Labs  12/30/16 0732  NA 130*  K 4.9  CL 101  GLUCOSE 153*  BUN 15  CREATININE 0.63  CALCIUM 8.9   CBG (last 3)   Recent Labs  12/31/16 1644 12/31/16 2149 01/01/17 0648  GLUCAP 122* 91 141*    Wt Readings from Last 3 Encounters:  12/29/16 119 kg (262 lb 6.4 oz)  12/25/16 131.7 kg (290 lb 6.4 oz)  12/17/16 127.9 kg (282 lb)    Physical Exam:  BP 138/73 (BP Location: Right Arm)   Pulse 97   Temp 98.9 F (37.2 C) (Oral)   Resp 18   Ht 5' (1.524 m)   Wt 119 kg (262 lb 6.4 oz)   SpO2 99%   BMI 51.25 kg/m  Constitutional: She appears well-developed. No distress. Obese.  HENT: Normocephalicand atraumatic.  Eyes: EOMI. No discharge.  Cardiovascular: RRR. No JVD. Respiratory: Effort normal breath sounds normal.  GI: Soft. Bowel sounds are normal.   Musculoskeletal: She exhibits no edemaor tenderness.  Neurological: She is alertand oriented.  Speech clear.  Follows commands without difficulty.  Motor: B/l UE motor 5/5 prox to distal.  B/l LE: 0/5 HF, KE and ADF/PF (unchanged).  Sensation diminished to LT below inguinal area (unchanged).  Skin: Skin is warmand dry. She is not diaphoretic.  Psychiatric: She has a normal mood and affect. Her speech is normaland behavior is normal. Judgmentand thought contentnormal. Cognition and memoryare normal.    Assessment/Plan: 1. Functional deficits secondary to  Transverse Myelitis which require 3+ hours per day of interdisciplinary therapy in a comprehensive inpatient rehab setting. Physiatrist is providing close team supervision and 24 hour management of active medical problems listed below. Physiatrist and rehab team continue to assess barriers to discharge/monitor patient progress toward functional and medical goals.  Function:  Bathing Bathing position   Position: Bed  Bathing parts Body parts bathed by patient: Right arm, Left arm, Chest, Abdomen, Right upper leg, Left upper leg Body parts bathed by helper: Front perineal area, Buttocks  Bathing assist        Upper Body Dressing/Undressing Upper body dressing   What is the patient wearing?: Hospital gown                Upper body assist Assist Level: Supervision or verbal cues      Lower Body Dressing/Undressing Lower body dressing   What is the patient wearing?: Non-skid slipper socks           Non-skid slipper socks- Performed by helper: Don/doff left sock, Don/doff right sock                  Lower body assist        Toileting Toileting Toileting activity did not occur: No continent bowel/bladder event        Toileting assist     Transfers Chair/bed transfer  Chair/bed transfer method: Lateral scoot Chair/bed transfer assist level: Moderate assist (Pt 50 - 74%/lift or lower) Chair/bed transfer assistive device: Sliding board Mechanical lift: Maximove   Locomotion Ambulation Ambulation activity did not occur: Safety/medical concerns         Wheelchair   Type: Manual Max wheelchair distance: 160 ft Assist Level: Supervision or verbal cues  Cognition Comprehension Comprehension assist level: Follows complex conversation/direction with no assist  Expression Expression assist level: Expresses complex ideas: With no assist  Social Interaction Social Interaction assist level: Interacts appropriately with others - No medications needed.  Problem  Solving Problem solving assist level: Solves complex problems: Recognizes & self-corrects  Memory Memory assist level: Complete Independence: No helper    Medical Problem List and Plan: 1.  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  DVT:  LLE DVT - Xarelto started 3. Pain Management: paraesthesias controlled on Neurontin tid and Mobic daily.   Tramadol PRN 4. Mood: LCSW to follow for evaluation and support.  5. Neuropsych: This patient is capable of making decisions on her own behalf. 6. Skin/Wound Care: Educate on importance of boosting and pressure relief measures.  7. Fluids/Electrolytes/Nutrition: Monitor I/O.  8. T2DM: new diagnosis with Hgb A1C-6.9 at admission.   Monitor BS ac/hs.    Educate patient on CM diet.   SSI for now  Will consider oral meds if persistently elevated, labile at present 9. Neurogenic bowel:  Incontinent of bowel twice a day per patient. Changed miralax to Senna and schedule suppository daily in am. 10. Morbid obesity: Educate on appropriate diet. Will require bariatric equipment as well as skin monitoring to prevent breakdown.   11. Neurogenic bladder: foley in place at present. D/ced with voiding trial  Will consider meds after tx UTI 12. Acute lower UTI  Ucx remains pending  Empiric Macrobid 5/17-5/23 13. Hyponatremia  Na 130 on 5/16  Cont to monitor  Labs ordered for Monday 14. Transaminitis  Cont to monitor  Labs ordered for Monday   LOS (Days) 3 A FACE TO FACE EVALUATION WAS PERFORMED  Ankit Lorie Phenix 01/01/2017 8:54 AM

## 2017-01-01 NOTE — Progress Notes (Signed)
Physical Therapy Session Note  Patient Details  Name: Traci [name] MRN: [id number] Date of Birth: 08/15/1976  Today's Date: 01/01/2017 PT Individual Time:0804-0901  57 min   Short Term Goals: Week 1:  PT Short Term Goal 1 (Week 1): Pt will perform bed mobility with mod assist consistently PT Short Term Goal 2 (Week 1): Pt will propell WC >150 and supervision assist from PT  PT Short Term Goal 3 (Week 1): Pt will transfer to and from [facility] with assist of 1 consistently PT Short Term Goal 4 (Week 1): pt will maintian dynamic sitting balance with min assist and no UE support   Skilled Therapeutic Interventions/Progress Updates:    Pt in bed upon arrival, agreeable to PT session. Bed Mobility: working on rolling bilaterally, supine>sitting with cues for LE management (min assist) with min assist (pt using rails to assist. Transfers: EOB>w/c, w/c <>mat table using slide board (mod assist). Working on [employer] and hand placement. Pt with tendency to pull and reach back. 1 LOB posteriorly with assist to recover. Using step under feet for improved LE control. Pt propelling w/c 210 ft with supervision, rest breaks taken as needed. Pt provided with gloves for hand protection. Following session, pt up in w/c with all needs in reach.   Therapy Documentation Precautions:  Precautions Precautions: Fall Precaution Comments: BLE paraparesis Restrictions Weight Bearing Restrictions: No   Pain: Pain Assessment Pain Assessment: No/denies pain Pain Score: 0-No pain  See Function Navigator for Current Functional Status.   Therapy/Group: Individual Therapy  Linard Millers, PT 01/01/2017, 5:08 PM

## 2017-01-01 NOTE — Progress Notes (Signed)
Occupational Therapy Session Note  Patient Details  Name: Traci [name] MRN: [id number] Date of Birth: Aug 29, 1975  Today's Date: 01/01/2017 OT Individual Time: 1115-1200 OT Individual Time Calculation (min): 45 min    Short Term Goals: Week 1:  OT Short Term Goal 1 (Week 1): Pt will complete LB bathing sit to supine with mod assist.  OT Short Term Goal 2 (Week 1): Pt will donn pullup shorts/pants wiith mod assist in supine position rolling. OT Short Term Goal 3 (Week 1): Pt will complete sliding board transfer to the drop arm commode with max assist.   OT Short Term Goal 4 (Week 1): Pt complete toilet hygiene and clothing with max assist using lateral leans in sitting.   Skilled Therapeutic Interventions/Progress Updates:    1:1. No c/o pain. Pt slide board transfer with MAX A +2 available to hold chair and assist with hips. Pt requires VC for head hip relationship, hand placement and manual facilitation of trunk flexion. While seated in w/c pt brushes teeth, lathers hair in shower cap and brushes hair with set up. Pt dons/doffs socks using reacher and sock aide with A to remove leg rests to push socks off feet. Pt completes 10 w/c push up to improve BUE strength required for functional transfers and ADLS with visual demonstration by OT and VC for technique. Exited session with pt seated in w/c with call light in reach and all needs met.   Therapy Documentation Precautions:  Precautions Precautions: Fall Precaution Comments: BLE paraparesis Restrictions Weight Bearing Restrictions: No  See Function Navigator for Current Functional Status.   Therapy/Group: Individual Therapy  Tonny Branch 01/01/2017, 12:32 PM

## 2017-01-02 ENCOUNTER — Inpatient Hospital Stay (HOSPITAL_COMMUNITY): Payer: PRIVATE HEALTH INSURANCE

## 2017-01-02 ENCOUNTER — Inpatient Hospital Stay (HOSPITAL_COMMUNITY): Payer: PRIVATE HEALTH INSURANCE | Admitting: Occupational Therapy

## 2017-01-02 DIAGNOSIS — G8221 Paraplegia, complete: Secondary | ICD-10-CM

## 2017-01-02 LAB — URINE CULTURE: Culture: >=100000 — AB

## 2017-01-02 LAB — GLUCOSE, CAPILLARY
Glucose-Capillary: 122 mg/dL — ABNORMAL HIGH (ref 65–99)
Glucose-Capillary: 104 mg/dL — ABNORMAL HIGH (ref 65–99)
Glucose-Capillary: 129 mg/dL — ABNORMAL HIGH (ref 65–99)
Glucose-Capillary: 125 mg/dL — ABNORMAL HIGH (ref 65–99)

## 2017-01-02 MED ORDER — CEPHALEXIN 250 MG PO CAPS
250.0000 mg | ORAL_CAPSULE | Freq: Three times a day (TID) | ORAL | Status: AC
  Administered 2017-01-02 – 2017-01-09 (×21): 250 mg via ORAL
  Filled 2017-01-02 (×21): qty 1

## 2017-01-02 NOTE — Progress Notes (Signed)
Occupational Therapy Session Note  Patient Details  Name: Traci [name] MRN: [id number] Date of Birth: 08/05/1976  Today's Date: 01/02/2017 OT Individual Time: 4536-4680 OT Individual Time Calculation (min): 55 min    Short Term Goals: Week 1:  OT Short Term Goal 1 (Week 1): Pt will complete LB bathing sit to supine with mod assist.  OT Short Term Goal 2 (Week 1): Pt will donn pullup shorts/pants wiith mod assist in supine position rolling. OT Short Term Goal 3 (Week 1): Pt will complete sliding board transfer to the drop arm commode with max assist.   OT Short Term Goal 4 (Week 1): Pt complete toilet hygiene and clothing with max assist using lateral leans in sitting.   Skilled Therapeutic Interventions/Progress Updates:    Tx focus on bed mobility and transfers during functional tasks, and IADL mgt.   Pt greeted supine in bed. Teds donned by OT, pt able to don gripper socks in supported circle sitting, then transitioned to EOB with supervision and HOB elevated. Slideboard transfer completed to w/c with Min A (verbalizing her need to anteriorally shift weight) and use of step under feet. Pt completed oral care/grooming with setup at sink. Afterwards she self propelled to therapy apartment. Discussed meal prep at home, pt reporting she most always eats out/takes out. Educated her on simple healthier options to prepare at home (i.e. Fruit on countertop). She participated in simulated meal prep, pt instructed on proper w/c placement when retrieving items from drawers and fridge. Emphasis on locking w/c brakes when reaching dynamically. Afterwards pt self propelled back to room. She was left in w/c with all needs within reach at time of departure.   Therapy Documentation Precautions:  Precautions Precautions: Fall Precaution Comments: BLE paraparesis Restrictions Weight Bearing Restrictions: No   Pain: No c/o pain during tx  Pain Assessment Pain Assessment: 0-10 Pain Score: 5  Pain Type:  Acute pain Pain Location: Back Pain Orientation: Lower Pain Descriptors / Indicators: Sharp Pain Frequency: Intermittent Pain Onset: Gradual Pain Intervention(s): Medication (See eMAR) ADL:      See Function Navigator for Current Functional Status.   Therapy/Group: Individual Therapy  Isabeau Mccalla A Merritt Mccravy 01/02/2017, 12:44 PM

## 2017-01-02 NOTE — Progress Notes (Signed)
Occupational Therapy Session Note  Patient Details  Name: Traci [name] MRN: [id number] Date of Birth: 12-08-1975  Today's Date: 01/02/2017 OT Individual Time: 3557-3220 OT Individual Time Calculation (min): 56 min    Short Term Goals: Week 1:  OT Short Term Goal 1 (Week 1): Pt will complete LB bathing sit to supine with mod assist.  OT Short Term Goal 2 (Week 1): Pt will donn pullup shorts/pants wiith mod assist in supine position rolling. OT Short Term Goal 3 (Week 1): Pt will complete sliding board transfer to the drop arm commode with max assist.   OT Short Term Goal 4 (Week 1): Pt complete toilet hygiene and clothing with max assist using lateral leans in sitting.   Skilled Therapeutic Interventions/Progress Updates:    1:1. Focus of session on ADL retraining and bed mobility from flat surface. Pt requests to eat breakfast at beginning of session requiring MIN A for LE facilitation to EOB from supine HOB elevated. Pt eats with set up EOB maintaining midline orientation. Pt bathes UB sitting EOB, however declines washing LB during this session. In long sitting pt able to move feet to doff/don B slipper socks and don new hospital gown d/t roommate not bringing clothing to practice with. Pt reporting nausea and buring pain in R hip requiring increased time for rest breaks throughout session. RN notified and administered medication during session. Pt supine HOB elevated>log sitting without use of rails as low as 10 degrees with supervision and VC for  Pushing through elbows/forearms/wrist. Pt requires MIN A for trunk facilitation from completely flat surface. Pt rolls B without use of handrails with MIN A for LE facilitation. Exited session with pt semi reclined in bed with call light in reach and all needs met.   Therapy Documentation Precautions:  Precautions Precautions: Fall Precaution Comments: BLE paraparesis Restrictions Weight Bearing Restrictions: No  See Function Navigator for  Current Functional Status.   Therapy/Group: Individual Therapy  Tonny Branch 01/02/2017, 10:21 AM

## 2017-01-02 NOTE — Progress Notes (Signed)
Occupational Therapy Session Note  Patient Details  Name: Traci [name] MRN: [id number] Date of Birth: 04-Dec-1975  Today's Date: 01/02/2017 OT Individual Time: 8101-7510 OT Individual Time Calculation (min): 28 min    Short Term Goals: Week 1:  OT Short Term Goal 1 (Week 1): Pt will complete LB bathing sit to supine with mod assist.  OT Short Term Goal 2 (Week 1): Pt will donn pullup shorts/pants wiith mod assist in supine position rolling. OT Short Term Goal 3 (Week 1): Pt will complete sliding board transfer to the drop arm commode with max assist.   OT Short Term Goal 4 (Week 1): Pt complete toilet hygiene and clothing with max assist using lateral leans in sitting.   Skilled Therapeutic Interventions/Progress Updates:    1:1. No pain reported. Focus of session on dressing and bed mobility. Pt had BM/incontinent bladder episode in brief. Pt rolls B with MIN A with VC for tecnique and OT provides A for posterior hygiene/changing brief. Pt able to complete front peri hygiene with set up. Pt supine HOB 10 degrees>long sitting with supervision and VC for technique/breathing. Pt dons shirt in long sitting with set up and threads pants over feet with VC to bring feet into circle sitting. Pt rolls B as stated above with A to maintain sidelying and pt advances pants over hips. Exited session with pt semi reclined in bed with call light in reach and all needs met.   Therapy Documentation Precautions:  Precautions Precautions: Fall Precaution Comments: BLE paraparesis Restrictions Weight Bearing Restrictions: No   Praxis   Exercises:   Other Treatments:    See Function Navigator for Current Functional Status.   Therapy/Group: Individual Therapy  Tonny Branch 01/02/2017, 2:59 PM

## 2017-01-02 NOTE — Progress Notes (Addendum)
Occupational Therapy Session Note  Patient Details  Name: Traci [name] MRN: [id number] Date of Birth: Sep 25, 1975  Today's Date: 01/02/2017 OT Individual Time: 7185-5015 OT Individual Time Calculation (min): 45 min    Short Term Goals: Week 1:  OT Short Term Goal 1 (Week 1): Pt will complete LB bathing sit to supine with mod assist.  OT Short Term Goal 2 (Week 1): Pt will donn pullup shorts/pants wiith mod assist in supine position rolling. OT Short Term Goal 3 (Week 1): Pt will complete sliding board transfer to the drop arm commode with max assist.   OT Short Term Goal 4 (Week 1): Pt complete toilet hygiene and clothing with max assist using lateral leans in sitting.   Skilled Therapeutic Interventions/Progress Updates:    1;1. Pt with no c/o pain during session. Focus of session on transfers, functional mobility, and UB exercise. Pt completes slide board transfer EOB<>BSC and EOB<>w/c with wooden block under feet, MIN A, and Vc for head hip relationship/hand placement. OT places board for each transfer. Pt requires increased time for rest breaks after each transfer. Pt propels w/c to/from therapy gym with supervision and rest breaks. Pt completes 1x10 therapeutic exercise in all planes of motion with VC for technique with 2# dumbells. During rest breaks discussed bowel programs and pressure relief. Pt would benefit from continued education on these topics. Exited session with pt semi reclined in bed with all needs met and call light in reach.   Therapy Documentation Precautions:  Precautions Precautions: Fall Precaution Comments: BLE paraparesis Restrictions Weight Bearing Restrictions: No General:    See Function Navigator for Current Functional Status.   Therapy/Group: Individual Therapy  Tonny Branch 01/02/2017, 5:31 PM

## 2017-01-02 NOTE — Progress Notes (Signed)
[facility] PHYSICAL MEDICINE & REHABILITATION     PROGRESS NOTE  Subjective/Complaints:  Some burning pain RIght thigh relieved by meds today Continuous tingling in legs  ROS: +Urinary retention worse at noc. Denies CP, SOB, N/V/D.  Objective: Vital Signs: Blood pressure 135/73, pulse 83, temperature 98.9 F (37.2 C), temperature source Oral, resp. rate 16, height 5' (1.524 m), weight 119 kg (262 lb 6.4 oz), SpO2 98 %. No results found. No results for input(s): WBC, HGB, HCT, PLT in the last 72 hours. No results for input(s): NA, K, CL, GLUCOSE, BUN, CREATININE, CALCIUM in the last 72 hours.  Invalid input(s): CO CBG (last 3)   Recent Labs  01/01/17 1651 01/01/17 2057 01/02/17 0610  GLUCAP 88 97 122*    Wt Readings from Last 3 Encounters:  12/29/16 119 kg (262 lb 6.4 oz)  12/25/16 131.7 kg (290 lb 6.4 oz)  12/17/16 127.9 kg (282 lb)    Physical Exam:  BP 135/73 (BP Location: Right Arm)   Pulse 83   Temp 98.9 F (37.2 C) (Oral)   Resp 16   Ht 5' (1.524 m)   Wt 119 kg (262 lb 6.4 oz)   SpO2 98%   BMI 51.25 kg/m  Constitutional: She appears well-developed. No distress. Obese.  HENT: Normocephalicand atraumatic.  Eyes: EOMI. No discharge.  Cardiovascular: RRR. No JVD. Respiratory: Effort normal breath sounds normal.  GI: Soft. Bowel sounds are normal.   Musculoskeletal: She exhibits no edemaor tenderness.  Neurological: She is alertand oriented.  Speech clear.  Follows commands without difficulty.  Motor: B/l UE motor 5/5 prox to distal.  B/l LE: 0/5 HF, KE and ADF/PF (unchanged).  Sensation diminished to LT below inguinal area (unchanged).  Skin: Skin is warmand dry. She is not diaphoretic.  Psychiatric: She has a normal mood and affect. Her speech is normaland behavior is normal. Judgmentand thought contentnormal. Cognition and memoryare normal.    Assessment/Plan: 1. Functional deficits secondary to Transverse Myelitis which require 3+ hours per  day of interdisciplinary therapy in a comprehensive inpatient rehab setting. Physiatrist is providing close team supervision and 24 hour management of active medical problems listed below. Physiatrist and rehab team continue to assess barriers to discharge/monitor patient progress toward functional and medical goals.  Function:  Bathing Bathing position   Position: Wheelchair/chair at sink  Bathing parts Body parts bathed by patient: Right arm, Left arm, Chest, Abdomen, Right upper leg, Left upper leg, Right lower leg, Left lower leg Body parts bathed by helper: Back, Front perineal area, Buttocks  Bathing assist Assist Level:  (60% completed by patient, moderate assist)      Upper Body Dressing/Undressing Upper body dressing   What is the patient wearing?: Hospital gown                Upper body assist Assist Level: Supervision or verbal cues      Lower Body Dressing/Undressing Lower body dressing   What is the patient wearing?: Non-skid slipper socks         Non-skid slipper socks- Performed by patient: Don/doff right sock, Don/doff left sock Non-skid slipper socks- Performed by helper: Don/doff left sock, Don/doff right sock               TED Hose - Performed by helper: Don/doff right TED hose, Don/doff left TED hose  Lower body assist        Toileting Toileting Toileting activity did not occur: No continent bowel/bladder event (pt wearing gown only)  Toileting steps completed by helper: Performs perineal hygiene    Toileting assist     Transfers Chair/bed transfer   Chair/bed transfer method: Lateral scoot Chair/bed transfer assist level: Moderate assist (Pt 50 - 74%/lift or lower) Chair/bed transfer assistive device: Sliding board Mechanical lift: Maximove   Locomotion Ambulation Ambulation activity did not occur: Safety/medical concerns         Wheelchair   Type: Manual Max wheelchair distance: 210 ft Assist Level: Supervision or verbal  cues  Cognition Comprehension Comprehension assist level: Follows complex conversation/direction with no assist  Expression Expression assist level: Expresses complex ideas: With no assist  Social Interaction Social Interaction assist level: Interacts appropriately with others - No medications needed.  Problem Solving Problem solving assist level: Solves complex problems: Recognizes & self-corrects  Memory Memory assist level: Complete Independence: No helper    Medical Problem List and Plan: 1.  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  DVT:  LLE DVT - Xarelto started 3. Pain Management: paraesthesias controlled on Neurontin tid and Mobic daily.   Tramadol PRN 4. Mood: LCSW to follow for evaluation and support.  5. Neuropsych: This patient is capable of making decisions on her own behalf. 6. Skin/Wound Care: Educate on importance of boosting and pressure relief measures.  7. Fluids/Electrolytes/Nutrition: Monitor I/O.  8. T2DM: new diagnosis with Hgb A1C-6.9 at admission.   Monitor BS ac/hs.    Educate patient on CM diet.   SSI for now  stablizing doubt if pt will need meds CBG (last 3)   Recent Labs  01/01/17 1651 01/01/17 2057 01/02/17 0610  GLUCAP 88 97 122*    9. Neurogenic bowel:  Incontinent of bowel twice a day per patient. Changed miralax to Senna and schedule suppository daily in am. 10. Morbid obesity: Educate on appropriate diet. Will require bariatric equipment as well as skin monitoring to prevent breakdown.   11. Neurogenic bladder: foley in place at present. D/ced with voiding trial  Will consider meds after tx UTI 12. Acute lower UTI  Ucx remains pending  Empiric Macrobid 5/17-5/23 13. Hyponatremia  Na 130 on 5/16  Cont to monitor  Labs ordered for Monday 14. Transaminitis  Cont to monitor  Labs ordered for Monday   LOS (Days) 4 A FACE TO FACE EVALUATION WAS  PERFORMED  Alysia Penna E 01/02/2017 9:03 AM

## 2017-01-03 DIAGNOSIS — G8220 Paraplegia, unspecified: Principal | ICD-10-CM

## 2017-01-03 LAB — GLUCOSE, CAPILLARY
GLUCOSE-CAPILLARY: 120 mg/dL — AB (ref 65–99)
Glucose-Capillary: 142 mg/dL — ABNORMAL HIGH (ref 65–99)
Glucose-Capillary: 131 mg/dL — ABNORMAL HIGH (ref 65–99)
Glucose-Capillary: 140 mg/dL — ABNORMAL HIGH (ref 65–99)

## 2017-01-03 MED ORDER — BETHANECHOL CHLORIDE 10 MG PO TABS
10.0000 mg | ORAL_TABLET | Freq: Three times a day (TID) | ORAL | Status: DC
  Administered 2017-01-03 – 2017-01-14 (×34): 10 mg via ORAL
  Filled 2017-01-03 (×35): qty 1

## 2017-01-03 NOTE — Progress Notes (Signed)
PHYSICAL MEDICINE & REHABILITATION     PROGRESS NOTE  Subjective/Complaints:  Some burning pain RIght thigh relieved by meds today Continuous tingling in legs  ROS: +Urinary retention worse at noc. Denies CP, SOB, N/V/D.  Objective: Vital Signs: Blood pressure 129/74, pulse 95, temperature 98.1 F (36.7 C), temperature source Oral, resp. rate 18, height 5' (1.524 m), weight 119 kg (262 lb 6.4 oz), SpO2 98 %. No results found. No results for input(s): WBC, HGB, HCT, PLT in the last 72 hours. No results for input(s): NA, K, CL, GLUCOSE, BUN, CREATININE, CALCIUM in the last 72 hours.  Invalid input(s): CO CBG (last 3)   Recent Labs  01/02/17 1647 01/02/17 2104 01/03/17 0628  GLUCAP 129* 125* 142*    Wt Readings from Last 3 Encounters:  12/29/16 119 kg (262 lb 6.4 oz)  12/25/16 131.7 kg (290 lb 6.4 oz)  12/17/16 127.9 kg (282 lb)    Physical Exam:  BP 129/74 (BP Location: Left Arm)   Pulse 95   Temp 98.1 F (36.7 C) (Oral)   Resp 18   Ht 5' (1.524 m)   Wt 119 kg (262 lb 6.4 oz)   SpO2 98%   BMI 51.25 kg/m  Constitutional: She appears well-developed. No distress. Obese.  HENT: Normocephalicand atraumatic.  Eyes: EOMI. No discharge.  Cardiovascular: RRR. No JVD. Respiratory: Effort normal breath sounds normal.  GI: Soft. Bowel sounds are normal.   Musculoskeletal: She exhibits no edemaor tenderness.  Neurological: She is alertand oriented.  Speech clear.  Follows commands without difficulty.  Motor: B/l UE motor 5/5 prox to distal.  B/l LE: 0/5 HF, KE and ADF/PF (unchanged).  Sensation diminished to LT below inguinal area (unchanged).  Skin: Skin is warmand dry. She is not diaphoretic.  Psychiatric: She has a normal mood and affect. Her speech is normaland behavior is normal. Judgmentand thought contentnormal. Cognition and memoryare normal.    Assessment/Plan: 1. Functional deficits secondary to Transverse Myelitis which require 3+ hours  per day of interdisciplinary therapy in a comprehensive inpatient rehab setting. Physiatrist is providing close team supervision and 24 hour management of active medical problems listed below. Physiatrist and rehab team continue to assess barriers to discharge/monitor patient progress toward functional and medical goals.  Function:  Bathing Bathing position   Position: Wheelchair/chair at sink  Bathing parts Body parts bathed by patient: Right arm, Left arm, Chest, Abdomen Body parts bathed by helper: Back, Front perineal area, Buttocks  Bathing assist Assist Level: Supervision or verbal cues (UB bathing only)      Upper Body Dressing/Undressing Upper body dressing   What is the patient wearing?: Pull over shirt/dress     Pull over shirt/dress - Perfomed by patient: Thread/unthread right sleeve, Thread/unthread left sleeve, Put head through opening, Pull shirt over trunk          Upper body assist Assist Level: Set up   Set up : To obtain clothing/put away  Lower Body Dressing/Undressing Lower body dressing   What is the patient wearing?: Pants     Pants- Performed by patient: Thread/unthread right pants leg, Thread/unthread left pants leg, Pull pants up/down   Non-skid slipper socks- Performed by patient: Don/doff right sock, Don/doff left sock Non-skid slipper socks- Performed by helper: Don/doff left sock, Don/doff right sock               TED Hose - Performed by helper: Don/doff right TED hose, Don/doff left TED hose  Lower body assist Assist  for lower body dressing: Touching or steadying assistance (Pt > 75%) (A to maintain sidelying while advancing pants past hips)      Toileting Toileting Toileting activity did not occur: No continent bowel/bladder event (pt wearing gown only)   Toileting steps completed by helper: Performs perineal hygiene    Toileting assist     Transfers Chair/bed transfer   Chair/bed transfer method: Lateral scoot Chair/bed transfer  assist level: Touching or steadying assistance (Pt > 75%) Chair/bed transfer assistive device: Sliding board Mechanical lift: Maximove   Locomotion Ambulation Ambulation activity did not occur: Safety/medical concerns         Wheelchair   Type: Manual Max wheelchair distance: 210 ft Assist Level: Supervision or verbal cues  Cognition Comprehension Comprehension assist level: Follows complex conversation/direction with no assist  Expression Expression assist level: Expresses complex ideas: With no assist  Social Interaction Social Interaction assist level: Interacts appropriately with others - No medications needed.  Problem Solving Problem solving assist level: Solves complex problems: Recognizes & self-corrects  Memory Memory assist level: Complete Independence: No helper    Medical Problem List and Plan: 1.  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  DVT:  LLE DVT - Xarelto started 3. Pain Management: paraesthesias controlled on Neurontin tid and Mobic daily.   Tramadol PRN 4. Mood: LCSW to follow for evaluation and support.  5. Neuropsych: This patient is capable of making decisions on her own behalf. 6. Skin/Wound Care: Educate on importance of boosting and pressure relief measures.  7. Fluids/Electrolytes/Nutrition: Monitor I/O.  8. T2DM: new diagnosis with Hgb A1C-6.9 at admission.   Monitor BS ac/hs.    Educate patient on CM diet.   SSI for now  No meds diet controlled CBG (last 3)   Recent Labs  01/02/17 1647 01/02/17 2104 01/03/17 0628  GLUCAP 129* 125* 142*    9. Neurogenic bowel:  Incontinent of bowel twice a day per patient. Changed miralax to Senna and schedule suppository daily in am. 10. Morbid obesity: Educate on appropriate diet. Will require bariatric equipment as well as skin monitoring to prevent breakdown.   11. Neurogenic bladder: foley in place at  present. D/ced with voiding trial  Partial voids, voided 19ml with residual of 300- start low dose urecholine 12. Acute lower UTI  Ucx remains pending  Empiric Macrobid 5/17-5/23 13. Hyponatremia  Na 130 on 5/16  Cont to monitor  Labs ordered for Monday 14. Transaminitis  Cont to monitor  Labs ordered for Monday   LOS (Days) 5 A FACE TO FACE EVALUATION WAS PERFORMED  Charlett Blake 01/03/2017 9:39 AM

## 2017-01-04 ENCOUNTER — Inpatient Hospital Stay (HOSPITAL_COMMUNITY): Payer: PRIVATE HEALTH INSURANCE | Admitting: Occupational Therapy

## 2017-01-04 ENCOUNTER — Inpatient Hospital Stay (HOSPITAL_COMMUNITY): Payer: PRIVATE HEALTH INSURANCE | Admitting: Physical Therapy

## 2017-01-04 LAB — CBC WITH DIFFERENTIAL/PLATELET
BASOS PCT: 0 %
Basophils Absolute: 0 10*3/uL (ref 0.0–0.1)
EOS ABS: 0.1 10*3/uL (ref 0.0–0.7)
Eosinophils Relative: 2 %
HEMATOCRIT: 35.4 % — AB (ref 36.0–46.0)
HEMOGLOBIN: 11.1 g/dL — AB (ref 12.0–15.0)
LYMPHS ABS: 2.4 10*3/uL (ref 0.7–4.0)
Lymphocytes Relative: 41 %
MCH: 25.4 pg — AB (ref 26.0–34.0)
MCHC: 31.4 g/dL (ref 30.0–36.0)
MCV: 81 fL (ref 78.0–100.0)
Monocytes Absolute: 0.4 10*3/uL (ref 0.1–1.0)
Monocytes Relative: 7 %
NEUTROS ABS: 2.9 10*3/uL (ref 1.7–7.7)
NEUTROS PCT: 50 %
Platelets: 200 10*3/uL (ref 150–400)
RBC: 4.37 MIL/uL (ref 3.87–5.11)
RDW: 15.5 % (ref 11.5–15.5)
WBC: 5.9 10*3/uL (ref 4.0–10.5)

## 2017-01-04 LAB — GLUCOSE, CAPILLARY
GLUCOSE-CAPILLARY: 143 mg/dL — AB (ref 65–99)
GLUCOSE-CAPILLARY: 100 mg/dL — AB (ref 65–99)
GLUCOSE-CAPILLARY: 87 mg/dL (ref 65–99)
Glucose-Capillary: 118 mg/dL — ABNORMAL HIGH (ref 65–99)

## 2017-01-04 LAB — BASIC METABOLIC PANEL
ANION GAP: 9 (ref 5–15)
BUN: 11 mg/dL (ref 6–20)
CO2: 24 mmol/L (ref 22–32)
Calcium: 9.2 mg/dL (ref 8.9–10.3)
Chloride: 101 mmol/L (ref 101–111)
Creatinine, Ser: 0.55 mg/dL (ref 0.44–1.00)
GFR calc non Af Amer: >60 mL/min (ref >60)
Glucose, Bld: 188 mg/dL — ABNORMAL HIGH (ref 65–99)
Potassium: 3.7 mmol/L (ref 3.5–5.1)
SODIUM: 134 mmol/L — AB (ref 135–145)

## 2017-01-04 NOTE — Progress Notes (Signed)
Traci [name] PHYSICAL MEDICINE & REHABILITATION     PROGRESS NOTE  Subjective/Complaints:  Pt seen laying in bed this AM.  She slept well overnight.  She notes bladder issues at night.    ROS: +Urinary retention qhs. Denies CP, SOB, N/V/D.  Objective: Vital Signs: Blood pressure 115/68, pulse 81, temperature 98.1 F (36.7 C), temperature source Oral, resp. rate 18, height 5' (1.524 m), weight 119 kg (262 lb 6.4 oz), SpO2 96 %. No results found. No results for input(s): WBC, HGB, HCT, PLT in the last 72 hours. No results for input(s): NA, K, CL, GLUCOSE, BUN, CREATININE, CALCIUM in the last 72 hours.  Invalid input(s): CO CBG (last 3)   Recent Labs  01/03/17 1646 01/03/17 2034 01/04/17 0644  GLUCAP 140* 120* 143*    Wt Readings from Last 3 Encounters:  12/29/16 119 kg (262 lb 6.4 oz)  12/25/16 131.7 kg (290 lb 6.4 oz)  12/17/16 127.9 kg (282 lb)    Physical Exam:  BP 115/68 (BP Location: Right Arm)   Pulse 81   Temp 98.1 F (36.7 C) (Oral)   Resp 18   Ht 5' (1.524 m)   Wt 119 kg (262 lb 6.4 oz)   SpO2 96%   BMI 51.25 kg/m  Constitutional: She appears well-developed. No distress. Obese.  HENT: Normocephalicand atraumatic.  Eyes: EOMI. No discharge.  Cardiovascular: RRR. No JVD. Respiratory: Effort normal breath sounds normal.  GI: Soft. Bowel sounds are normal.   Musculoskeletal: She exhibits no edemaor tenderness.  Neurological: She is alertand oriented.  Speech clear.  Follows commands without difficulty.  Motor: B/l UE motor 5/5 prox to distal.  B/l LE: 0/5 HF, KE and ADF/PF (stable).  Sensation diminished to LT below inguinal area (unchanged).  Skin: Skin is warmand dry. She is not diaphoretic.  Psychiatric: She has a normal mood and affect. Her speech is normaland behavior is normal. Judgmentand thought contentnormal. Cognition and memoryare normal.    Assessment/Plan: 1. Functional deficits secondary to Transverse Myelitis which require 3+  hours per day of interdisciplinary therapy in a comprehensive inpatient rehab setting. Physiatrist is providing close team supervision and 24 hour management of active medical problems listed below. Physiatrist and rehab team continue to assess barriers to discharge/monitor patient progress toward functional and medical goals.  Function:  Bathing Bathing position   Position: Wheelchair/chair at sink  Bathing parts Body parts bathed by patient: Right arm, Left arm, Chest, Abdomen Body parts bathed by helper: Back, Front perineal area, Buttocks  Bathing assist Assist Level: Supervision or verbal cues (UB bathing only)      Upper Body Dressing/Undressing Upper body dressing   What is the patient wearing?: Pull over shirt/dress     Pull over shirt/dress - Perfomed by patient: Thread/unthread right sleeve, Thread/unthread left sleeve, Put head through opening, Pull shirt over trunk          Upper body assist Assist Level: Set up   Set up : To obtain clothing/put away  Lower Body Dressing/Undressing Lower body dressing   What is the patient wearing?: Pants     Pants- Performed by patient: Thread/unthread right pants leg, Thread/unthread left pants leg, Pull pants up/down   Non-skid slipper socks- Performed by patient: Don/doff right sock, Don/doff left sock Non-skid slipper socks- Performed by helper: Don/doff left sock, Don/doff right sock               TED Hose - Performed by helper: Don/doff right TED hose, Don/doff left TED  hose  Lower body assist Assist for lower body dressing: Touching or steadying assistance (Pt > 75%) (A to maintain sidelying while advancing pants past hips)      Toileting Toileting Toileting activity did not occur: No continent bowel/bladder event (pt wearing gown only)   Toileting steps completed by helper: Performs perineal hygiene    Toileting assist     Transfers Chair/bed transfer   Chair/bed transfer method: Lateral scoot Chair/bed  transfer assist level: Touching or steadying assistance (Pt > 75%) Chair/bed transfer assistive device: Sliding board Mechanical lift: Maximove   Locomotion Ambulation Ambulation activity did not occur: Safety/medical concerns         Wheelchair   Type: Manual Max wheelchair distance: 210 ft Assist Level: Supervision or verbal cues  Cognition Comprehension Comprehension assist level: Follows complex conversation/direction with no assist  Expression Expression assist level: Expresses complex ideas: With no assist  Social Interaction Social Interaction assist level: Interacts appropriately with others - No medications needed.  Problem Solving Problem solving assist level: Solves complex problems: Recognizes & self-corrects  Memory Memory assist level: Complete Independence: No helper    Medical Problem List and Plan: 1.  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  DVT:  LLE DVT - Xarelto started 3. Pain Management: paraesthesias controlled on Neurontin tid and Mobic daily.   Tramadol PRN 4. Mood: LCSW to follow for evaluation and support.  5. Neuropsych: This patient is capable of making decisions on her own behalf. 6. Skin/Wound Care: Educate on importance of boosting and pressure relief measures.  7. Fluids/Electrolytes/Nutrition: Monitor I/O.  8. T2DM: new diagnosis with Hgb A1C-6.9 at admission.   Monitor BS ac/hs.    Educate patient on CM diet.   SSI for now  Fairly controlled 5/21 9. Neurogenic bowel:  Incontinent of bowel twice a day per patient. Changed miralax to Senna and schedule suppository daily in am. 10. Morbid obesity: Educate on appropriate diet. Will require bariatric equipment as well as skin monitoring to prevent breakdown.   11. Neurogenic bladder: foley d/ced with voiding trial  Low dose urecholine started, will consider increase if necessary 12. Acute lower UTI  Ucx with  Proteus and E.Coli  Resistant to macrobid, Keflex started 5/19 13. Hyponatremia  Na 130 on 5/16  Cont to monitor  Labs pending 14. Transaminitis  Cont to monitor  Labs pending   LOS (Days) 6 A FACE TO FACE EVALUATION WAS PERFORMED  Traci [name] Traci [name] 01/04/2017 9:06 AM

## 2017-01-04 NOTE — Progress Notes (Signed)
Physical Therapy Session Note  Patient Details  Name: Traci [name] MRN: [id number] Date of Birth: Apr 04, 1976  Today's Date: 01/04/2017 PT Individual Time: 9794-8016 PT Individual Time Calculation (min): 74 min   Short Term Goals: Week 1:  PT Short Term Goal 1 (Week 1): Pt will perform bed mobility with mod assist consistently PT Short Term Goal 2 (Week 1): Pt will propell WC >150 and supervision assist from PT  PT Short Term Goal 3 (Week 1): Pt will transfer to and from [facility] with assist of 1 consistently PT Short Term Goal 4 (Week 1): pt will maintian dynamic sitting balance with min assist and no UE support   Skilled Therapeutic Interventions/Progress Updates:    Pt in bed upon arrival, agreeable to PT session. Bed mobility: working on sitting<>supine from EOB - using UEs to assist on leg at a time. On mat table, working on rolling bilaterally, utilizing LE positioning and momentum. Supine>long sitting from side and supine. In long sitting working on weigh shifting for scooting technique. Transfers: bed>w/c and w/c<>mat table with min assist, using block under feet for LE control. Encouraging head/hip relationship. Exercise: ROM/stretches into dorsiflexion, knee flexion, hip flexion, hamstring stretch. Standing in standing frame with decreasing UEs support. W/C- propelling w/c in halls with supervision. Following session, pt returned to room in w/c, all needs in reach.   Therapy Documentation Precautions:  Precautions Precautions: Fall Precaution Comments: BLE paraparesis Restrictions Weight Bearing Restrictions: No Pain: Denies pain  See Function Navigator for Current Functional Status.   Therapy/Group: Individual Therapy  Linard Millers, PT 01/04/2017, 3:44 PM

## 2017-01-04 NOTE — Progress Notes (Signed)
Occupational Therapy Session Note  Patient Details  Name: Traci [name] MRN: [id number] Date of Birth: 1976/01/15  Today's Date: 01/04/2017 OT Individual Time: 1300-1402 OT Individual Time Calculation (min): 62 min    Short Term Goals: Week 1:  OT Short Term Goal 1 (Week 1): Pt will complete LB bathing sit to supine with mod assist.  OT Short Term Goal 2 (Week 1): Pt will donn pullup shorts/pants wiith mod assist in supine position rolling. OT Short Term Goal 3 (Week 1): Pt will complete sliding board transfer to the drop arm commode with max assist.   OT Short Term Goal 4 (Week 1): Pt complete toilet hygiene and clothing with max assist using lateral leans in sitting.   Skilled Therapeutic Interventions/Progress Updates:    Pt transferred to EOB with min assist, requiring only assist for bringing LEs off of the EOB.  Once sitting she completed sliding board transfer to the wheelchair with min assist after setup of the board.  Practiced drop arm commode transfers from wheelchair to bed with mod assist.  Once on the toilet had pt work on managing clothing with lateral leans side to side.  Max assist for pulling down pants, with total assist to remove soiled brief.  Total assist for peri hygiene in sitting as well as for donning new brief and pull up pants.  Noted pt with bright red blood in her stool and nursing was made aware.  Mod assist for transfer back to the wheelchair once toileting was completed.  Pt left in wheelchair at bedside in preparation for next therapy.    Therapy Documentation Precautions:  Precautions Precautions: Fall Precaution Comments: BLE paraparesis Restrictions Weight Bearing Restrictions: No  Pain: Pain Assessment Pain Assessment: No/denies pain Pain Score: 0-No pain ADL: See Function Navigator for Current Functional Status.   Therapy/Group: Individual Therapy  Abdel Effinger OTR/L 01/04/2017, 3:56 PM

## 2017-01-04 NOTE — Progress Notes (Signed)
Occupational Therapy Session Note  Patient Details  Name: Traci [name] MRN: [id number] Date of Birth: 11/15/1975  Today's Date: 01/04/2017 OT Individual Time: 0830-0930 OT Individual Time Calculation (min): 60 min    Short Term Goals: Week 1:  OT Short Term Goal 1 (Week 1): Pt will complete LB bathing sit to supine with mod assist.  OT Short Term Goal 2 (Week 1): Pt will donn pullup shorts/pants wiith mod assist in supine position rolling. OT Short Term Goal 3 (Week 1): Pt will complete sliding board transfer to the drop arm commode with max assist.   OT Short Term Goal 4 (Week 1): Pt complete toilet hygiene and clothing with max assist using lateral leans in sitting.   Skilled Therapeutic Interventions/Progress Updates:      Pt seen for BADL retraining of  bathing and dressing with a focus on adaptive strategies.  Pt worked on LB bathing and dressing from bed level by sitting in long sitting to reach her feet to wash legs and don pants and socks.  To wash perineal area, pt layed back in supine and therapist assist with thorough cleansing of her bottom. Pt was able to pull pants over hips with rolling side to side.  To fully roll onto each side she needs touching A to manage legs.  Pt then sat to EOB and completed a slide board transfer to w.c with A to place the board under hips and raised foot stool under feet.  From w/c level, pt completed UB self care.  Pt is progressing well with her transfers and ability to use adaptive strategies. Pt in room with all needs met.  Therapy Documentation Precautions:  Precautions Precautions: Fall Precaution Comments: BLE paraparesis Restrictions Weight Bearing Restrictions: No    Vital Signs: Therapy Vitals Temp: 98.1 F (36.7 C) Temp Source: Oral Pulse Rate: 81 Resp: 18 BP: 115/68 Patient Position (if appropriate): Lying Oxygen Therapy SpO2: 96 % O2 Device: Not Delivered Pain: Pain Assessment Pain Score: 0-No pain ADL:  See Function  Navigator for Current Functional Status.   Therapy/Group: Individual Therapy  SAGUIER,JULIA 01/04/2017, 8:29 AM  

## 2017-01-05 ENCOUNTER — Inpatient Hospital Stay (HOSPITAL_COMMUNITY): Payer: PRIVATE HEALTH INSURANCE | Admitting: Occupational Therapy

## 2017-01-05 ENCOUNTER — Encounter (HOSPITAL_COMMUNITY): Payer: PRIVATE HEALTH INSURANCE | Admitting: Psychology

## 2017-01-05 ENCOUNTER — Inpatient Hospital Stay (HOSPITAL_COMMUNITY): Payer: PRIVATE HEALTH INSURANCE | Admitting: Physical Therapy

## 2017-01-05 ENCOUNTER — Inpatient Hospital Stay (HOSPITAL_COMMUNITY): Payer: PRIVATE HEALTH INSURANCE

## 2017-01-05 DIAGNOSIS — D62 Acute posthemorrhagic anemia: Secondary | ICD-10-CM

## 2017-01-05 LAB — CBC
HCT: 33.5 % — ABNORMAL LOW (ref 36.0–46.0)
Hemoglobin: 10.4 g/dL — ABNORMAL LOW (ref 12.0–15.0)
MCH: 25.1 pg — ABNORMAL LOW (ref 26.0–34.0)
MCHC: 31 g/dL (ref 30.0–36.0)
MCV: 80.9 fL (ref 78.0–100.0)
PLATELETS: 182 10*3/uL (ref 150–400)
RBC: 4.14 MIL/uL (ref 3.87–5.11)
RDW: 15.7 % — ABNORMAL HIGH (ref 11.5–15.5)
WBC: 6.7 10*3/uL (ref 4.0–10.5)

## 2017-01-05 LAB — HEPATIC FUNCTION PANEL
ALBUMIN: 2.8 g/dL — AB (ref 3.5–5.0)
ALT: 39 U/L (ref 14–54)
AST: 25 U/L (ref 15–41)
Alkaline Phosphatase: 51 U/L (ref 38–126)
Bilirubin, Direct: <0.1 mg/dL — ABNORMAL LOW (ref 0.1–0.5)
Total Bilirubin: 0.9 mg/dL (ref 0.3–1.2)
Total Protein: 7.4 g/dL (ref 6.5–8.1)

## 2017-01-05 LAB — BASIC METABOLIC PANEL
Anion gap: 7 (ref 5–15)
BUN: 11 mg/dL (ref 6–20)
CHLORIDE: 101 mmol/L (ref 101–111)
CO2: 28 mmol/L (ref 22–32)
CREATININE: 0.63 mg/dL (ref 0.44–1.00)
Calcium: 9.3 mg/dL (ref 8.9–10.3)
GFR calc non Af Amer: >60 mL/min (ref >60)
Glucose, Bld: 119 mg/dL — ABNORMAL HIGH (ref 65–99)
Potassium: 4 mmol/L (ref 3.5–5.1)
SODIUM: 136 mmol/L (ref 135–145)

## 2017-01-05 LAB — GLUCOSE, CAPILLARY
GLUCOSE-CAPILLARY: 105 mg/dL — AB (ref 65–99)
GLUCOSE-CAPILLARY: 86 mg/dL (ref 65–99)
GLUCOSE-CAPILLARY: 129 mg/dL — AB (ref 65–99)
Glucose-Capillary: 108 mg/dL — ABNORMAL HIGH (ref 65–99)

## 2017-01-05 NOTE — Evaluation (Signed)
Recreational Therapy Assessment and Plan  Patient Details  Name: Traci [name] MRN: [id number] Date of Birth: 05-04-1976 Today's Date: 01/05/2017  Rehab Potential: Good ELOS: 14 days   Assessment  Problem List:      Patient Active Problem List   Diagnosis Date Noted  . Thoracic myelopathy   . Neurogenic bladder   . Neurogenic bowel   . Bilateral leg weakness   . CNS demyelination ([place])   . Myelitis ([place])   . Paresthesia of both lower extremities   . Morbid obesity ([place])   . Spinal stenosis of lumbar region   . DDD (degenerative disc disease), lumbosacral   . Diabetes mellitus type 2 in obese ([place])   . Paraplegia ([place])   . Transverse myelitis ([place]) 12/25/2016  . Steroid-induced hyperglycemia 12/25/2016    Past Medical History:      Past Medical History:  Diagnosis Date  . H/O degenerative disc disease   . Obesity    Past Surgical History:       Past Surgical History:  Procedure Laterality Date  . fibroid tumor    . KNEE ARTHROSCOPY    . TUBAL LIGATION      Assessment & Plan Clinical Impression: Patient is a 41 y.o. year old female with recent admission to the hospital on 12/20/16 with BLE numbness, band like pain in bilateral lumbar region, urinary incontinence,and reports of fevers. MRI Tspine reviewed, showing increased signal T-spine and distal. Per report, hyperintense signal abnormality within mid thoracic cord to conus medullaris concerning for demyelination, moderate L4-5 disc extrusion with mild canal stenosis affecting L5 nerve, moderate right L4/5 neural foraminal narrowing. MRI brain without white matter lesion or abnormal enhancement. She was found to have decreased rectal tone as well as urinary retention. Neurology felt that symptoms due to transverse myelitis with aearance most consistent with inflammatory v/s viral and patient treated with IV solumedrol X 5 days as well as empiric IV acyclovir.  Patient transferred to CIR on  12/29/2016 .    Pt presents with decreased activity tolerance, decreased functional mobility, decreased balance Limiting pt's independence with leisure/community pursuits.  Leisure History/Participation Premorbid leisure interest/current participation: Medical laboratory scientific officer - Building control surveyor - Doctor, hospital - Travel (Comment) Expression Interests: Music (Comment) Other Leisure Interests: Television;Movies;Videogames;Computer;Cooking/Baking Leisure Participation Style: With Family/Friends;Alone Awareness of Community Resources: Good-identify 3 post discharge leisure resources Psychosocial / Spiritual Patient agreeable to Pet Therapy: No Does patient have pets?: No Social interaction - Mood/Behavior: Cooperative Engineer, drilling for Education?: Yes Recreational Therapy Orientation Orientation -Reviewed with patient: Available activity resources Strengths/Weaknesses Patient Strengths/Abilities: Willingness to participate;Active premorbidly Patient weaknesses: Physical limitations TR Patient demonstrates impairments in the following area(s): Edema;Endurance;Motor;Pain;Safety  Plan Rec Therapy Plan Is patient appropriate for Therapeutic Recreation?: Yes Rehab Potential: Good Treatment times per week: MIn 1 TR session/group >20 minutes during LOS Estimated Length of Stay: 14 days TR Treatment/Interventions: Adaptive equipment instruction;Balance/vestibular training;Functional mobility training;Community reintegration;Group participation (Comment);Patient/family education;Therapeutic activities;Recreation/leisure participation;Therapeutic exercise;UE/LE Coordination activities;Wheelchair propulsion/positioning  Recommendations for other services: None   Discharge Criteria: Patient will be discharged from TR if patient refuses treatment 3 consecutive times without medical reason.  If treatment goals not met, if there is a change in medical status, if patient makes no  progress towards goals or if patient is discharged from hospital.  The above assessment, treatment plan, treatment alternatives and goals were discussed and mutually agreed upon: by patient  [place] 01/05/2017, 4:08 PM

## 2017-01-05 NOTE — Progress Notes (Signed)
Physical Therapy Session Note  Patient Details  Name: Traci [name] MRN: [id number] Date of Birth: 02-12-1976  Today's Date: 01/05/2017 PT Individual Time: 1130-1200 PT Individual Time Calculation (min): 30 min   Short Term Goals: Week 1:  PT Short Term Goal 1 (Week 1): Pt will perform bed mobility with mod assist consistently PT Short Term Goal 2 (Week 1): Pt will propell WC >150 and supervision assist from PT  PT Short Term Goal 3 (Week 1): Pt will transfer to and from [facility] with assist of 1 consistently PT Short Term Goal 4 (Week 1): pt will maintian dynamic sitting balance with min assist and no UE support   Skilled Therapeutic Interventions/Progress Updates:    bed mobility re-training to come to EOB with min assist and cues for efficient technique. slideboard transfer to w/c with overall min assist (required assist to place slideboard also but able to perform lateral lean with supervision) for LE management and cues for technique and head/hips relationship. Pt brushed teeth at sink mod I prior to leaving room. TR present to perform evaluation while PT adjusting w/c for improved sitting tolerance and LE alignment. Pt able to propel w/c with supervision for functional strengthening and endurance.   Therapy Documentation Precautions:  Precautions Precautions: Fall Precaution Comments: BLE paraparesis Restrictions Weight Bearing Restrictions: No   Pain: No complaints of pain.   See Function Navigator for Current Functional Status.   Therapy/Group: Individual Therapy  Canary Brim Ivory Broad, PT, DPT  01/05/2017, 12:16 PM

## 2017-01-05 NOTE — Progress Notes (Signed)
Physical Therapy Session Note  Patient Details  Name: Traci [name] MRN: [id number] Date of Birth: Jul 25, 1976  Today's Date: 01/05/2017 PT Individual Time: 5056-9794 PT Individual Time Calculation (min): 74 min   Short Term Goals: Week 1:  PT Short Term Goal 1 (Week 1): Pt will perform bed mobility with mod assist consistently PT Short Term Goal 2 (Week 1): Pt will propell WC >150 and supervision assist from PT  PT Short Term Goal 3 (Week 1): Pt will transfer to and from [facility] with assist of 1 consistently PT Short Term Goal 4 (Week 1): pt will maintian dynamic sitting balance with min assist and no UE support   Skilled Therapeutic Interventions/Progress Updates:    Pt sitting in w/c upon arrival, agreeable to PT session. Pt propelling w/c in halls 200 ft with supervision. Up/down 20' ramp X2 and 40 ft ramp X1. In/out of elevator X2. Transfers: w/c<>mat table uneven transfers, increased difficulty with uphill transfer (approx 2 inch difference). Sitting reaching performed at variable angles including forward. Manual back stop used for maximal controlled excursion. Dorsiflexion and hip flexion stretches performed bilaterally. Pt up in w/c in room with all needs in reach after session.    Therapy Documentation Precautions:  Precautions Precautions: Fall Precaution Comments: BLE paraparesis Restrictions Weight Bearing Restrictions: No Pain: Pain Assessment Pain Assessment: 0-10 Pain Score: 0-No pain Does report tingling in LEs.    See Function Navigator for Current Functional Status.   Therapy/Group: Individual Therapy  Linard Millers, PT 01/05/2017, 4:00 PM

## 2017-01-05 NOTE — Consult Note (Signed)
Neuropsychological Consultation   Patient:   [name]   DOB:   16-May-1976  MR Number:  [id number]  Location:  [place] [address] [facility] [address] [id number] [place] [address] Dept: [place]: [phone]           Date of Service:   01/05/2017  Start Time:   3 PM End Time:   4:10 PM  Provider/Observer:  Ilean Skill, Psy.D.       Clinical Neuropsychologist       Billing Code/Service: [id number] 4 Units  Chief Complaint:    The patient was referred for neuropsychological/psychological consultation due to coping issues and stressors. The patient was admitted on 12/20/2016 with BLE numbness, bandlike pain in both lumbar regions, urinary incontinence and fevers.  Reason for Service: Traci [name] is a 41 year old female who has reports of left sciatica over the past couple of months and one week history of pain in her lower legs along with numbness and tingling.  She had sudden onset LLE weakness spreading to Right.  Burning and instability.  The full HPI for this admission is presented below.    Traci [name] a 41 y.o.femalewith history of morbid obesity, DDD with reports of left sciatica over past couple of months and one week history of pain LLE with numbness tingling. She developed sudden onset of LLE weakness spreading to RLE with burning and BLE instability. She was admitted on 12/20/16 with BLE numbness, band like pain in bilateral lumbar region, urinary incontinence,and reports of fevers. MRI Tspine reviewed, showing increased signal T-spine and distal. Per report, hyperintense signal abnormality within mid thoracic cord to conus medullaris concerning for demyelination, moderate L4-5 disc extrusion with mild canal stenosis affecting L5 nerve, moderate right L4/5 neural foraminal narrowing. MRI brain without white matter lesion or abnormal enhancement. She was found to have decreased rectal tone as  well as urinary retention. Neurology felt that symptoms due to transverse myelitis with appearance most consistent with inflammatory v/s viral and patient treated with IV solumedrol X 5 days as well as empiric IV acyclovir. LP done showing elevated WBC 106 and glucose 92. CSF negative for oligoclonal bands.  Dr. Arnoldo Morale consulted for input and felt that LLE pain due to L4/5 moderate stenosis and that this could be addressed in future if necessary. She had minimal improvement with steroids and treated with IVIG X 5 days. Serum NMO-IgG, EBV PCR and CMV PCR pending. 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 She has had some improvement in sensation BLE but continues to have paraplegia with substantial decline in mobility as well as ability to carry out ADL tasks. She is able to sit at EOB for 20 minutes and stand with significant assist. Therapy ongoing and CIR recommended for follow up therapy  Current Status:  The patient appears to be coping and handling very well.  In fact, I have some concerns that she may not be processing and dealing with all the physical implications of what is happening medically.  Reliability of Information: Information is derived from 1 hour face-to-face clinical interview with the patient, review of available medical records as well as direct discussions and consultations with treatment team members.  Behavioral Observation: Traci [name]  presents as a 41 y.o.-year-old Right African American/Native American [place] Female who appeared her stated age. her dress was Appropriate and she was Well Groomed and her manners were Appropriate to the situation.  her participation was indicative of  Appropriate and Attentive behaviors.  There were physical disabilities noted (complete paralysis of both right and left legs).  she displayed an appropriate level of cooperation and motivation.     Interactions:    Active Appropriate and  Attentive  Attention:   within normal limits and attention span and concentration were age appropriate  Memory:   within normal limits; recent and remote memory intact  Visuo-spatial:  within normal limits  Speech (Volume):  normal  Speech:   normal;   Thought Process:  Coherent and Relevant  Though Content:  WNL;   Orientation:   person, place, time/date and situation  Judgment:   Good  Planning:   Good  Affect:    Not Congruent  Mood:    There were no indications of depression, anxiety or frustration. Her mood and affect were quite bright and in some ways were incongruent with the severity and significance of what she is going through medically.  Insight:   Good  Intelligence:   normal  Medical History:   Past Medical History:  Diagnosis Date  . H/O degenerative disc disease   . Obesity            Family Med/Psych History:  Family History  Problem Relation Age of Onset  . Diabetes Mother   . Diabetes Father     Risk of Suicide/Violence: virtually non-existent the patient denies any suicidal or homicidal ideation and she denies any significant level of depression or other psychiatric symptoms.   Impression/DX:  The patient is a 41 year old female that has developed some type of demyelinating condition in her thoracic spine. Specific etiological factors are still under review. However, she has experienced almost total paralysis of both of her legs and complete lack of sensation except for some burning sensations in her thighs. The patient appears to be handling this exceptionally well. In fact, I am somewhat concerned that the level of adjustment and her emotional responses seems to be so positive it is almost incongruent with the patient completely grasping the severity and significance of what she is going through. I do think that this issue needs to be monitored because if she is suppressing and denying these issues on an emotional level and just addressing them on an  intellectual level we may be dealing with some residual emotional responses down the road.  Disposition/Plan:  I will remain available for consultation interaction with the patient. I do think that staff members will need to monitor her to make sure that we are not having some type of overcompensation and repression of the real emotional significance about what is happening.  Diagnosis:    Transverse myelitis ([place]) - Plan: Ambulatory referral to Physical Medicine Rehab         Electronically Signed   _______________________ Ilean Skill, Psy.D.

## 2017-01-05 NOTE — Progress Notes (Signed)
Social Work Patient ID: Traci [name], female   DOB: July 02, 1976, 41 y.o.   MRN: [id number]  Pt had questions regarding STD and how she needs to apply for this. Discussed she would need to get the forms from Her Optometrist at [employer]. She has left messages, but school not re-opened yet for summer classes. Once she can get the information will get to PA to complete. She is aware she will need Physical care at discharge and she is figuring out what the best option is for her. She doesn't want to have to go to [facility] but she may have too until she is mod/i level.

## 2017-01-05 NOTE — Progress Notes (Signed)
[facility] PHYSICAL MEDICINE & REHABILITATION     PROGRESS NOTE  Subjective/Complaints:  Pt seen laying in bed this AM.  She slept well overnight.  She notes that she is starting to feel burning/tingling in her LE, but does not want any meds.  ROS: +Burning/tingling. Denies CP, SOB, N/V/D.  Objective: Vital Signs: Blood pressure 128/66, pulse 84, temperature 99 F (37.2 C), temperature source Oral, resp. rate 18, height 5' (1.524 m), weight 119 kg (262 lb 6.4 oz), SpO2 98 %. No results found.  Recent Labs  01/04/17 0915 01/05/17 0446  WBC 5.9 6.7  HGB 11.1* 10.4*  HCT 35.4* 33.5*  PLT 200 182    Recent Labs  01/04/17 0915 01/05/17 0446  NA 134* 136  K 3.7 4.0  CL 101 101  GLUCOSE 188* 119*  BUN 11 11  CREATININE 0.55 0.63  CALCIUM 9.2 9.3   CBG (last 3)   Recent Labs  01/04/17 1653 01/04/17 2116 01/05/17 0622  GLUCAP 100* 87 105*    Wt Readings from Last 3 Encounters:  12/29/16 119 kg (262 lb 6.4 oz)  12/25/16 131.7 kg (290 lb 6.4 oz)  12/17/16 127.9 kg (282 lb)    Physical Exam:  BP 128/66 (BP Location: Right Arm)   Pulse 84   Temp 99 F (37.2 C) (Oral)   Resp 18   Ht 5' (1.524 m)   Wt 119 kg (262 lb 6.4 oz)   SpO2 98%   BMI 51.25 kg/m  Constitutional: She appears well-developed. No distress. Obese.  HENT: Normocephalicand atraumatic.  Eyes: EOMI. No discharge.  Cardiovascular: RRR. No JVD. Respiratory: Effort normal breath sounds normal.  GI: Soft. Bowel sounds are normal.   Musculoskeletal: She exhibits no edemaor tenderness.  Neurological: She is alertand oriented.  Speech clear.  Follows commands without difficulty.  Motor: B/l UE motor 5/5 prox to distal.  B/l LE: 0/5 HF, KE and ADF/PF (unchanged).  Sensation diminished to LT below inguinal area (unchanged).  Skin: Skin is warmand dry. She is not diaphoretic.  Psychiatric: She has a normal mood and affect. Her speech is normaland behavior is normal. Judgmentand thought  contentnormal. Cognition and memoryare normal.    Assessment/Plan: 1. 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 Physiatrist is providing close team supervision and 24 hour management of active medical problems listed below. Physiatrist and rehab team continue to assess barriers to discharge/monitor patient progress toward functional and medical goals.  Function:  Bathing Bathing position   Position: Bed (bed for lower body, w/c UB)  Bathing parts Body parts bathed by patient: Right upper leg, Left upper leg, Right lower leg, Left lower leg, Front perineal area, Abdomen, Chest, Left arm, Right arm Body parts bathed by helper: Buttocks  Bathing assist Assist Level: Supervision or verbal cues (UB bathing only)      Upper Body Dressing/Undressing Upper body dressing   What is the patient wearing?: Pull over shirt/dress     Pull over shirt/dress - Perfomed by patient: Thread/unthread right sleeve, Thread/unthread left sleeve, Put head through opening, Pull shirt over trunk          Upper body assist Assist Level: Set up   Set up : To obtain clothing/put away  Lower Body Dressing/Undressing Lower body dressing   What is the patient wearing?: Pants, Non-skid slipper socks, Ted Hose     Pants- Performed by patient: Thread/unthread right pants leg, Thread/unthread  left pants leg, Pull pants up/down   Non-skid slipper socks- Performed by patient: Don/doff left sock, Don/doff right sock Non-skid slipper socks- Performed by helper: Don/doff left sock, Don/doff right sock               TED Hose - Performed by helper: Don/doff right TED hose, Don/doff left TED hose  Lower body assist Assist for lower body dressing: Touching or steadying assistance (Pt > 75%) (A to maintain sidelying while advancing pants past hips)      Toileting Toileting Toileting activity did  not occur: No continent bowel/bladder event   Toileting steps completed by helper: Adjust clothing prior to toileting, Performs perineal hygiene, Adjust clothing after toileting Toileting Assistive Devices: Toilet aid  Toileting assist Assist level: Touching or steadying assistance (Pt.75%)   Transfers Chair/bed transfer   Chair/bed transfer method: Lateral scoot Chair/bed transfer assist level: Touching or steadying assistance (Pt > 75%) Chair/bed transfer assistive device: Sliding board Mechanical lift: Maximove   Locomotion Ambulation Ambulation activity did not occur: Safety/medical concerns         Wheelchair   Type: Manual Max wheelchair distance: 175 ft Assist Level: Supervision or verbal cues  Cognition Comprehension Comprehension assist level: Follows complex conversation/direction with no assist  Expression Expression assist level: Expresses complex ideas: With no assist  Social Interaction Social Interaction assist level: Interacts appropriately with others - No medications needed.  Problem Solving Problem solving assist level: Solves complex problems: Recognizes & self-corrects  Memory Memory assist level: Complete Independence: No helper    Medical Problem List and Plan: 1.  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  DVT:  LLE DVT - Xarelto started 3. Pain Management: paraesthesias controlled on Neurontin tid and Mobic daily.   Tramadol PRN 4. Mood: LCSW to follow for evaluation and support.  5. Neuropsych: This patient is capable of making decisions on her own behalf. 6. Skin/Wound Care: Educate on importance of boosting and pressure relief measures.  7. Fluids/Electrolytes/Nutrition: Monitor I/O.  8. T2DM: new diagnosis with Hgb A1C-6.9 at admission.   Monitor BS ac/hs.    Educate patient on CM diet.   SSI for now  Controlled 5/23 9. Neurogenic bowel:  Incontinent of  bowel twice a day per patient. Changed miralax to Senna and scheduled suppository 10. Morbid obesity: Educate on appropriate diet. Will require bariatric equipment as well as skin monitoring to prevent breakdown.   11. Neurogenic bladder: foley d/ced with voiding trial  Low dose urecholine started, will consider increase if necessary 12. Acute lower UTI  Ucx with Proteus and E.Coli  Resistant to macrobid, Keflex started 5/19 13. Hyponatremia  Na 136 on 5/22  Cont to monitor 14. Transaminitis  Cont to monitor  Labs pending 15. ABLA  Hb 10.4 on 5/22  Cont to monitor  LOS (Days) 7 A FACE TO FACE EVALUATION WAS PERFORMED  Devinn Hurwitz Lorie Phenix 01/05/2017 8:54 AM

## 2017-01-05 NOTE — Progress Notes (Signed)
0034-incontinent of B&B, PVR=200. 0650 bladder scan=491, I & O cath=600cc's.Traci [name] A

## 2017-01-05 NOTE — Progress Notes (Signed)
Occupational Therapy Weekly Progress Note  Patient Details  Name: Traci [name] MRN: [id number] Date of Birth: Jun 26, 1976  Beginning of progress report period: Dec 30, 2016 End of progress report period: Jan 05, 2017  Today's Date: 01/05/2017 OT Individual Time: 5075-7322 OT Individual Time Calculation (min): 29 min    Patient has met 3 of 4 short term goals.  Pt is making steady progress with OT at this time.  She completes UB bathing and dressing sitting on the EOB with supervision and LB bathing and dressing in supine with HOB elevated.  Pt is able to manage her LEs in circle sitting to donn LB clothing with min to mod assist needed to transition to supine and roll side to side.  Min assist for transitioning to sitting from sidelying.  She competes sliding board transfers to the wheelchair and to the drop arm commode with min assist, but needs max to total assist for all clothing management and toilet hygiene.  Currently, Ms.  Duwayne Heck still demonstrates no active movement in her LEs to assist with any of these tasks.  Recommend continued CIR level OT to progress to supervision/min assist level for selfcare tasks.    Patient continues to demonstrate the following deficits: muscle weakness, impaired timing and sequencing and decreased sitting balance and decreased balance strategies and therefore will continue to benefit from skilled OT intervention to enhance overall performance with BADL and Reduce care partner burden.  Patient progressing toward long term goals..  Continue plan of care.  OT Short Term Goals Week 2:  OT Short Term Goal 1 (Week 2): Pt will complete toilet hygiene with max assist in sitting with lateral leans side to side OT Short Term Goal 2 (Week 2): Pt will perform sliding board transfers with min assist to the drop arm commode.    OT Short Term Goal 3 (Week 2): Pt will complete LB dressing in long sitting/circle sit to supine with min assist. OT Short Term Goal 4 (Week  2): Pt will complete LB bathing supine rolling side to side with min assist.   Skilled Therapeutic Interventions/Progress Updates:    Pt completed functional mobility using the wheelchair with supervision to the pt laundry room.  She was able to place her clothing in the washing machine from wheelchair level but needed assist from therapist to adjust and start it based on the constraints and size limitations of the laundry area vs her wheelchair.  She was able to roll her wheelchair back to the room with conclusion of this task with increased time and 2 rest breaks.  Pt left in wheelchair with call button and phone in reach.    Therapy Documentation Precautions:  Precautions Precautions: Fall Precaution Comments: BLE paraparesis Restrictions Weight Bearing Restrictions: No  Pain: Pain Assessment Pain Assessment: No/denies pain Pain Score: 0-No pain ADL: See Function Navigator for Current Functional Status.   Therapy/Group: Individual Therapy  Artrell Lawless OTR/L 01/05/2017, 4:29 PM

## 2017-01-05 NOTE — Plan of Care (Signed)
Problem: SCI BOWEL ELIMINATION Goal: RH STG MANAGE BOWEL WITH ASSISTANCE STG Manage Bowel with Mod Assistance.   Outcome: Not Progressing Total assist Goal: RH STG SCI MANAGE BOWEL WITH MEDICATION WITH ASSISTANCE STG SCI Manage bowel with medication with min assistance.   Outcome: Not Progressing Total assist

## 2017-01-05 NOTE — Progress Notes (Signed)
Occupational Therapy Session Note  Patient Details  Name: Traci [name] MRN: [id number] Date of Birth: 1975/11/23  Today's Date: 01/05/2017 OT Individual Time: 0930-1030 OT Individual Time Calculation (min): 60 min    Short Term Goals: Week 1:  OT Short Term Goal 1 (Week 1): Pt will complete LB bathing sit to supine with mod assist.  OT Short Term Goal 2 (Week 1): Pt will donn pullup shorts/pants wiith mod assist in supine position rolling. OT Short Term Goal 3 (Week 1): Pt will complete sliding board transfer to the drop arm commode with max assist.   OT Short Term Goal 4 (Week 1): Pt complete toilet hygiene and clothing with max assist using lateral leans in sitting.       Skilled Therapeutic Interventions/Progress Updates:    Pt seen for B/D bed level with a focus on long sitting to reach her feet and rolling.  Pt attempted to roll without bed rails with hands clasped ( a technique she began learning) but was not able to fully turn on softer mattress. With rails, she is able to roll well.    Today pt donned her TED hose herself in long sitting.  Pt was incontinent of bowel and increased time needed for adequate clean up.  Pt opted for a long dresses vs pants today.  Due to time, pt remained in bed until PT session.  Therapy Documentation Precautions:  Precautions Precautions: Fall Precaution Comments: BLE paraparesis Restrictions Weight Bearing Restrictions: No   Pain: Pain Assessment Pain Assessment: 0-10 Pain Score: 0-No pain  See Function Navigator for Current Functional Status.   Therapy/Group: Individual Therapy  [place] 01/05/2017, 1:06 PM

## 2017-01-06 ENCOUNTER — Inpatient Hospital Stay (HOSPITAL_COMMUNITY): Payer: PRIVATE HEALTH INSURANCE | Admitting: Occupational Therapy

## 2017-01-06 ENCOUNTER — Inpatient Hospital Stay (HOSPITAL_COMMUNITY): Payer: PRIVATE HEALTH INSURANCE | Admitting: *Deleted

## 2017-01-06 ENCOUNTER — Inpatient Hospital Stay (HOSPITAL_COMMUNITY): Payer: PRIVATE HEALTH INSURANCE | Admitting: Physical Therapy

## 2017-01-06 DIAGNOSIS — M792 Neuralgia and neuritis, unspecified: Secondary | ICD-10-CM

## 2017-01-06 LAB — GLUCOSE, CAPILLARY
GLUCOSE-CAPILLARY: 152 mg/dL — AB (ref 65–99)
GLUCOSE-CAPILLARY: 94 mg/dL (ref 65–99)
GLUCOSE-CAPILLARY: 126 mg/dL — AB (ref 65–99)
Glucose-Capillary: 105 mg/dL — ABNORMAL HIGH (ref 65–99)

## 2017-01-06 MED ORDER — SENNA 8.6 MG PO TABS
1.0000 | ORAL_TABLET | Freq: Every day | ORAL | Status: DC
  Administered 2017-01-06 – 2017-01-12 (×6): 8.6 mg via ORAL
  Filled 2017-01-06 (×6): qty 1

## 2017-01-06 MED ORDER — ACETAMINOPHEN 325 MG PO TABS
650.0000 mg | ORAL_TABLET | Freq: Four times a day (QID) | ORAL | Status: DC
  Administered 2017-01-06 – 2017-01-24 (×69): 650 mg via ORAL
  Filled 2017-01-06 (×73): qty 2
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
## 2017-01-06 NOTE — Plan of Care (Signed)
Problem: SCI BOWEL ELIMINATION Goal: RH STG MANAGE BOWEL WITH ASSISTANCE STG Manage Bowel with Mod Assistance.   Outcome: Not Progressing Total assist- bowel program and still having accidents throughout day Goal: RH STG SCI MANAGE BOWEL PROGRAM W/ASSIST OR AS APPROPRIATE STG SCI Manage bowel program w/min assist or as appropriate.   Outcome: Not Progressing Max assist

## 2017-01-06 NOTE — Progress Notes (Signed)
Occupational Therapy Session Note  Patient Details  Name: Traci [name] MRN: [id number] Date of Birth: 1976-04-13  Today's Date: 01/06/2017 OT Individual Time: 1300-1358 OT Individual Time Calculation (min): 58 min    Short Term Goals: Week 1:  OT Short Term Goal 1 (Week 1): Pt will complete LB bathing sit to supine with mod assist.  OT Short Term Goal 1 - Progress (Week 1): Met OT Short Term Goal 2 (Week 1): Pt will donn pullup shorts/pants wiith mod assist in supine position rolling. OT Short Term Goal 2 - Progress (Week 1): Met OT Short Term Goal 3 (Week 1): Pt will complete sliding board transfer to the drop arm commode with max assist.   OT Short Term Goal 3 - Progress (Week 1): Met OT Short Term Goal 4 (Week 1): Pt complete toilet hygiene and clothing with max assist using lateral leans in sitting.  OT Short Term Goal 4 - Progress (Week 1): Not met  Skilled Therapeutic Interventions/Progress Updates:    Pt completed wheelchair mobility to the therapy gym with supervision.  Once in the gym she utilized the sliding board transfer with min assist to the therapy mat.  Once in the mat worked on simulated dressing using theraband and pt completing lateral leans side to side for pulling band over hips and then pushing back down with min assist.  Noted pt incontinent of bowel during working so transferred back to the wheelchair with mod assist secondary to not having support of the LEs.  Once back to the room had pt transfer back to the bed via sliding board with min assist.  Worked on rolling side to side for removal of clothing and soiled brief.  Min assist for managing pants with max assist for cleaning peri area and donning new brief.  Finished session with pt in bed with call button and phone in reach.    Therapy Documentation Precautions:  Precautions Precautions: Fall Precaution Comments: BLE paraparesis Restrictions Weight Bearing Restrictions: No   Pain: Pain Assessment Pain  Assessment: No/denies pain Pain Score: 2  Pain Type: Acute pain Pain Location: Back Pain Orientation: Lower Pain Descriptors / Indicators: Aching Pain Onset: With Activity Patients Stated Pain Goal: 3 Pain Intervention(s): Medication (See eMAR) ADL:   See Function Navigator for Current Functional Status.   Therapy/Group: Individual Therapy  Josmar Messimer OTR/L 01/06/2017, 2:10 PM

## 2017-01-06 NOTE — Progress Notes (Addendum)
Physical Therapy Session Note  Patient Details  Name: Traci [name] MRN: [id number] Date of Birth: [birth year]  Today's Date: 01/06/2017 PT Individual Time:0800-0900, 1510-1540 PT Individual Time Calculation (min): 60 min, 30 min   Short Term Goals: Week 1:  PT Short Term Goal 1 (Week 1): Pt will perform bed mobility with mod assist consistently PT Short Term Goal 2 (Week 1): Pt will propell WC >150 and supervision assist from PT  PT Short Term Goal 3 (Week 1): Pt will transfer to and from [facility] with assist of 1 consistently PT Short Term Goal 4 (Week 1): pt will maintian dynamic sitting balance with min assist and no UE support   Skilled Therapeutic Interventions/Progress Updates:   Session 1:  Pt in bed upon arrival, agreeable to PT session. Bed Mobility: supine>sitting supervision with rail. From long sitting, pt able to don TED hose, shirt, pants with supervision. Transfer: bed>w/c without use of box for LEs, assist provided as needed with LE management/support. Pt propelling w/c in halls with supervision. Began discussion on measurements for home at D/C, pt remains uncertain of where she will be going following rehab stay. Following session, pt returned to room in w/c. Pt with all needs in reach.  Session 2: Pt in bed upon arrival, supervision with bed flat to come to EOB with assist of rail. Transfers performed bed>w/c<>mat table. Working on balance and stability without LE support. Cues for head/hips and hand placement. Following session, pt up in w/c in room with all needs in reach.   Therapy Documentation Precautions:  Precautions Precautions: Fall Precaution Comments: BLE paraparesis Restrictions Weight Bearing Restrictions: No   Pain: Still having burning in LEs.   See Function Navigator for Current Functional Status.   Therapy/Group: Individual Therapy  Linard Millers, PT 01/06/2017, 8:21 AM

## 2017-01-06 NOTE — Progress Notes (Signed)
[place] PHYSICAL MEDICINE & REHABILITATION     PROGRESS NOTE  Subjective/Complaints:  Pt seen laying in bed this AM.  She slept well overnight.  She notes improvement in bladder function, but persistent burning/tingling for which she does not want medication adjustments.   ROS: +Burning/tingling. Denies CP, SOB, N/V/D.  Objective: Vital Signs: Blood pressure 126/68, pulse 79, temperature 98.4 F (36.9 C), temperature source Oral, resp. rate 17, height 5' (1.524 m), weight 119 kg (262 lb 6.4 oz), SpO2 99 %. No results found.  Recent Labs  01/04/17 0915 01/05/17 0446  WBC 5.9 6.7  HGB 11.1* 10.4*  HCT 35.4* 33.5*  PLT 200 182    Recent Labs  01/04/17 0915 01/05/17 0446  NA 134* 136  K 3.7 4.0  CL 101 101  GLUCOSE 188* 119*  BUN 11 11  CREATININE 0.55 0.63  CALCIUM 9.2 9.3   CBG (last 3)   Recent Labs  01/05/17 1642 01/05/17 2125 01/06/17 0659  GLUCAP 86 129* 152*    Wt Readings from Last 3 Encounters:  12/29/16 119 kg (262 lb 6.4 oz)  12/25/16 131.7 kg (290 lb 6.4 oz)  12/17/16 127.9 kg (282 lb)    Physical Exam:  BP 126/68 (BP Location: Left Arm)   Pulse 79   Temp 98.4 F (36.9 C) (Oral)   Resp 17   Ht 5' (1.524 m)   Wt 119 kg (262 lb 6.4 oz)   SpO2 99%   BMI 51.25 kg/m  Constitutional: She appears well-developed. No distress. Obese.  HENT: Normocephalicand atraumatic.  Eyes: EOMI. No discharge.  Cardiovascular: RRR. No JVD. Respiratory: Effort normal breath sounds normal.  GI: Soft. Bowel sounds are normal.   Musculoskeletal: She exhibits no edemaor tenderness.  Neurological: She is alertand oriented.  Speech clear.  Follows commands without difficulty.  Motor: B/l UE motor 5/5 prox to distal.  B/l LE: 0/5 HF, KE and ADF/PF (unchanged).  Sensation diminished to LT below inguinal area (stable).  Skin: Skin is warmand dry. She is not diaphoretic.  Psychiatric: She has a normal mood and affect. Her speech is normaland behavior is  normal. Judgmentand thought contentnormal. Cognition and memoryare normal.    Assessment/Plan: 1. 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 Physiatrist is providing close team supervision and 24 hour management of active medical problems listed below. Physiatrist and rehab team continue to assess barriers to discharge/monitor patient progress toward functional and medical goals.  Function:  Bathing Bathing position   Position: Bed  Bathing parts Body parts bathed by patient: Right upper leg, Left upper leg, Right lower leg, Left lower leg, Front perineal area, Abdomen, Chest, Left arm, Right arm Body parts bathed by helper: Buttocks, Back  Bathing assist Assist Level: Supervision or verbal cues (UB bathing only)      Upper Body Dressing/Undressing Upper body dressing   What is the patient wearing?: Pull over shirt/dress     Pull over shirt/dress - Perfomed by patient: Thread/unthread right sleeve, Thread/unthread left sleeve, Put head through opening, Pull shirt over trunk          Upper body assist Assist Level: Set up   Set up : To obtain clothing/put away  Lower Body Dressing/Undressing Lower body dressing   What is the patient wearing?: Non-skid slipper socks, Ted Hose     Pants- Performed by patient: Thread/unthread right pants leg, Thread/unthread left pants leg, Pull pants up/down  Non-skid slipper socks- Performed by patient: Don/doff left sock, Don/doff right sock Non-skid slipper socks- Performed by helper: Don/doff left sock, Don/doff right sock             TED Hose - Performed by patient: Don/doff right TED hose, Don/doff left TED hose TED Hose - Performed by helper: Don/doff right TED hose, Don/doff left TED hose  Lower body assist Assist for lower body dressing: Touching or steadying assistance (Pt > 75%) (A to maintain sidelying while  advancing pants past hips)      Toileting Toileting Toileting activity did not occur: No continent bowel/bladder event   Toileting steps completed by helper: Adjust clothing prior to toileting, Performs perineal hygiene, Adjust clothing after toileting Toileting Assistive Devices: Toilet aid  Toileting assist Assist level: Set up/obtain supplies   Transfers Chair/bed transfer   Chair/bed transfer method: Lateral scoot Chair/bed transfer assist level: Touching or steadying assistance (Pt > 75%) Chair/bed transfer assistive device: Armrests, Sliding board Mechanical lift: Maximove   Locomotion Ambulation Ambulation activity did not occur: Safety/medical concerns         Wheelchair   Type: Manual Max wheelchair distance: 200' Assist Level: Supervision or verbal cues  Cognition Comprehension Comprehension assist level: Follows complex conversation/direction with no assist  Expression Expression assist level: Expresses complex ideas: With no assist  Social Interaction Social Interaction assist level: Interacts appropriately with others - No medications needed.  Problem Solving Problem solving assist level: Solves complex problems: Recognizes & self-corrects  Memory Memory assist level: Complete Independence: No helper    Medical Problem List and Plan: 1.  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  DVT:  LLE DVT - Xarelto started 3. Pain Management:   Paraesthesias controlled on Neurontin tid and Mobic daily, does not want to increase meds at present.   Tramadol PRN 4. Mood: LCSW to follow for evaluation and support.  5. Neuropsych: This patient is capable of making decisions on her own behalf. 6. Skin/Wound Care: Educate on importance of boosting and pressure relief measures.  7. Fluids/Electrolytes/Nutrition: Monitor I/O.  8. T2DM: new diagnosis with Hgb A1C-6.9 at admission.   Monitor BS  ac/hs.    Educate patient on CM diet.   SSI for now  Slightly elevated 5/23, cont to monitor 9. Neurogenic bowel:  Incontinent of bowel twice a day per patient. Changed miralax to Senna and scheduled suppository 10. Morbid obesity: Educate on appropriate diet. Will require bariatric equipment as well as skin monitoring to prevent breakdown.   11. Neurogenic bladder: foley d/ced with voiding trial  Low dose urecholine started, improved 12. Acute lower UTI  Ucx with Proteus and E.Coli  Resistant to macrobid, Keflex started 5/19 13. Hyponatremia  Na 136 on 5/22  Cont to monitor 14. Transaminitis: Resolved  Cont to monitor 15. ABLA  Hb 10.4 on 5/22  Cont to monitor  LOS (Days) 8 A FACE TO FACE EVALUATION WAS PERFORMED  Marvens Hollars Lorie Phenix 01/06/2017 8:06 AM

## 2017-01-06 NOTE — Progress Notes (Signed)
Occupational Therapy Session Note  Patient Details  Name: Traci [name] MRN: [id number] Date of Birth: [birth year]  Today's Date: 01/06/2017 OT Individual Time: 1102-1200 OT Individual Time Calculation (min): 58 min    Short Term Goals: Week 1:  OT Short Term Goal 1 (Week 1): Pt will complete LB bathing sit to supine with mod assist.  OT Short Term Goal 1 - Progress (Week 1): Met OT Short Term Goal 2 (Week 1): Pt will donn pullup shorts/pants wiith mod assist in supine position rolling. OT Short Term Goal 2 - Progress (Week 1): Met OT Short Term Goal 3 (Week 1): Pt will complete sliding board transfer to the drop arm commode with max assist.   OT Short Term Goal 3 - Progress (Week 1): Met OT Short Term Goal 4 (Week 1): Pt complete toilet hygiene and clothing with max assist using lateral leans in sitting.  OT Short Term Goal 4 - Progress (Week 1): Not met  Skilled Therapeutic Interventions/Progress Updates:    Pt completed transfer from supine to sit EOB with min assist and then to the wheelchair with min assist using the sliding board.  She was able to propel her wheelchair down to the ADL kitchen to work on simulated meal prep.  Pt was able to maneuver the wheelchair around obstacles to take items out of the refrigerator and lower level upper cabinets, with use of the reacher with supervision.  Discussed using counter tops to transfer items as needed as well as using bottles with closeable lids.  Pt voiced understanding.  Next part of session had pt complete 8 mins of UE ergonometer with resistance on level 12, divided into 4 mins each.  She was able to maintain at 25-30 RPMs.  Pt with 2 min rest break after 4 mins completion before finishing.  Oxygen stats 98% on room air with HR at 98 BPM as well.  Pt returned to room to complete session with call button and phone in reach.    Therapy Documentation Precautions:  Precautions Precautions: Fall Precaution Comments: BLE  paraparesis Restrictions Weight Bearing Restrictions: No    Pain: Pain Assessment Pain Assessment: No/denies pain Pain Score: 0-No pain ADL: See Function Navigator for Current Functional Status.   Therapy/Group: Individual Therapy  Norrine Ballester OTR/L 01/06/2017, 12:29 PM
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
## 2017-01-07 ENCOUNTER — Inpatient Hospital Stay (HOSPITAL_COMMUNITY): Payer: PRIVATE HEALTH INSURANCE | Admitting: Physical Therapy

## 2017-01-07 ENCOUNTER — Inpatient Hospital Stay (HOSPITAL_COMMUNITY): Payer: PRIVATE HEALTH INSURANCE | Admitting: Occupational Therapy

## 2017-01-07 DIAGNOSIS — K649 Unspecified hemorrhoids: Secondary | ICD-10-CM

## 2017-01-07 LAB — GLUCOSE, CAPILLARY
GLUCOSE-CAPILLARY: 100 mg/dL — AB (ref 65–99)
GLUCOSE-CAPILLARY: 124 mg/dL — AB (ref 65–99)
Glucose-Capillary: 143 mg/dL — ABNORMAL HIGH (ref 65–99)
Glucose-Capillary: 88 mg/dL (ref 65–99)

## 2017-01-07 MED ORDER — HYDROCORTISONE 2.5 % RE CREA
TOPICAL_CREAM | Freq: Two times a day (BID) | RECTAL | Status: DC
  Administered 2017-01-11 – 2017-01-27 (×14): via RECTAL
  Filled 2017-01-07 (×2): qty 28.35

## 2017-01-07 NOTE — Progress Notes (Signed)
Physical Therapy Session Note  Patient Details  Name: Traci [name] MRN: [id number] Date of Birth: 07-06-1976  Today's Date: 01/07/2017 PT Individual Time: 0900-1000 PT Individual Time Calculation (min): 60 min   Short Term Goals: Week 1:  PT Short Term Goal 1 (Week 1): Pt will perform bed mobility with mod assist consistently PT Short Term Goal 2 (Week 1): Pt will propell WC >150 and supervision assist from PT  PT Short Term Goal 3 (Week 1): Pt will transfer to and from [facility] with assist of 1 consistently PT Short Term Goal 4 (Week 1): pt will maintian dynamic sitting balance with min assist and no UE support   Skilled Therapeutic Interventions/Progress Updates: Pt presented in bed agreeable to therapy. Pt donned TED hose and socks mod I, pants with minA and additional time. Pt able to roll L/R with use of bed rail, mod I. Performed supine to sit with min guard and use of bed rail. Attempted SB transfer to w/c, pt able to instruct PTA in placement of w/c and height of bed however minA for SB placement. Pt performed partial transfer then needed to return to bed as rehab tech present for leg strap measurements. Pt able to return to supine in same manner as prior and initiated SB transfer to w/c, however PTA noted episode of incontinence on bed. Pt returned to bed and able to pull self from lower part of bed with use of bed rails. PTA provided total assist for clothing management and peri-cleaning for time management. Pt performed supine to sit at EOB in same manner as prior and able to cue PTA for w/c placement with PTA providing minA for SB placement. Pt successfully performed SB transfer to w/c with supervision and mod cues for head hips relationship. Pt in w/c at end of session at sink to perform oral care with current needs met.      Therapy Documentation Precautions:  Precautions Precautions: Fall Precaution Comments: BLE paraparesis Restrictions Weight Bearing Restrictions: Yes General:    Vital Signs:   Pain: Pain Assessment Pain Assessment: 0-10 Pain Score: 5  Pain Type: Acute pain Pain Location: Back Pain Orientation: Lower Pain Radiating Towards: legs Pain Descriptors / Indicators: Aching;Burning;Cramping;Tingling Pain Frequency: Constant Pain Onset: On-going Patients Stated Pain Goal: 2 Pain Intervention(s): Medication (See eMAR)  See Function Navigator for Current Functional Status.   Therapy/Group: Individual Therapy  Amadou Katzenstein  Netta Fodge, PTA  01/07/2017, 11:49 AM

## 2017-01-07 NOTE — Progress Notes (Signed)
Occupational Therapy Note  Patient Details  Name: Traci [name] MRN: [id number] Date of Birth: 1976-05-13  Today's Date: 01/07/2017 OT Individual Time: 1030-1130 OT Individual Time Calculation (min): 60 min   No c/o pain.  Pt seen this session to problem solve safe transfers to shower, so when she is ready to shower the transfers would have already been practices. Initially discussed discharge plan options. Once a d/c plan is in place, the bathroom equipment will be evaluated for her based on home set up.  A padded open seat tub bench obtained as this would be the best bench to use with the slide board and for cleansing.  Pt set up with w/c next to bench and worked on safe board placement.  With min A pt was able to transfer on and off the bench with the board, BUT pt will need much more practice before implementing this transfer with a shower.  She will need a plastic foot stool, practice head/hip orientation, and also practice using a pad on top of the board as she will not have clothing on.    Pt in agreement that she will need more practice.  Pt resting in w/c at end of session with all needs met.   Lares 01/07/2017, 1:25 PM

## 2017-01-07 NOTE — Progress Notes (Signed)
Occupational Therapy Session Note  Patient Details  Name: Traci [name] MRN: [id number] Date of Birth: July 18, 1976  Today's Date: 01/07/2017 OT Individual Time: 1446-1530 OT Individual Time Calculation (min): 44 min    Short Term Goals: Week 2:  OT Short Term Goal 1 (Week 2): Pt will complete toilet hygiene with max assist in sitting with lateral leans side to side OT Short Term Goal 2 (Week 2): Pt will perform sliding board transfers with min assist to the drop arm commode.    OT Short Term Goal 3 (Week 2): Pt will complete LB dressing in long sitting/circle sit to supine with min assist. OT Short Term Goal 4 (Week 2): Pt will complete LB bathing supine rolling side to side with min assist.   Skilled Therapeutic Interventions/Progress Updates:    Pt completed transfer from bed to wheelchair with min assist including setting up sliding board.  She needed therapist to help stabilize her legs on the elevated board during transfer.  She was then able to propel her wheelchair down to the therapy gym where she transferred to the therapy mat with min assist, again using the sliding board.  Worked on using push up blocks sitting EOM to work on scooting up and down the therapy mat.  Min to mod assist needed to complete scooting as pt cannot achieve full lift of her bottom.  Finished session with transfer back to the wheelchair with min assist with pt rolling herself back to the room.  One rest break needed when rolling to and from her room this session.  Call button and phone in reach.    Therapy Documentation Precautions:  Precautions Precautions: Fall Precaution Comments: BLE paraparesis Restrictions Weight Bearing Restrictions: No  Vital Signs: Therapy Vitals Temp: 98.7 F (37.1 C) Temp Source: Oral Pulse Rate: 88 Resp: 18 BP: 134/88 Patient Position (if appropriate): Sitting Oxygen Therapy SpO2: 97 % O2 Device: Not Delivered Pain: Pain Assessment Pain Assessment: No/denies  pain Pain Score: 5  ADL: See Function Navigator for Current Functional Status.   Therapy/Group: Individual Therapy  Keara Pagliarulo OTR/L 01/07/2017, 4:02 PM

## 2017-01-07 NOTE — Progress Notes (Signed)
[facility] PHYSICAL MEDICINE & REHABILITATION     PROGRESS NOTE  Subjective/Complaints:  Pt seen laying in bed this AM.  She slept well overnight.  She complains of hemmroids and notes spasms in her LLE overnight.   ROS: Denies CP, SOB, N/V/D.  Objective: Vital Signs: Blood pressure 124/65, pulse 73, temperature 98.5 F (36.9 C), temperature source Oral, resp. rate 16, height 5' (1.524 m), weight 119 kg (262 lb 6.4 oz), SpO2 100 %. No results found.  Recent Labs  01/04/17 0915 01/05/17 0446  WBC 5.9 6.7  HGB 11.1* 10.4*  HCT 35.4* 33.5*  PLT 200 182    Recent Labs  01/04/17 0915 01/05/17 0446  NA 134* 136  K 3.7 4.0  CL 101 101  GLUCOSE 188* 119*  BUN 11 11  CREATININE 0.55 0.63  CALCIUM 9.2 9.3   CBG (last 3)   Recent Labs  01/06/17 1700 01/06/17 2113 01/07/17 0627  GLUCAP 94 126* 143*    Wt Readings from Last 3 Encounters:  12/29/16 119 kg (262 lb 6.4 oz)  12/25/16 131.7 kg (290 lb 6.4 oz)  12/17/16 127.9 kg (282 lb)    Physical Exam:  BP 124/65 (BP Location: Left Arm)   Pulse 73   Temp 98.5 F (36.9 C) (Oral)   Resp 16   Ht 5' (1.524 m)   Wt 119 kg (262 lb 6.4 oz)   SpO2 100%   BMI 51.25 kg/m  Constitutional: She appears well-developed. No distress. Obese.  HENT: Normocephalicand atraumatic.  Eyes: EOMI. No discharge.  Cardiovascular: RRR. No JVD. Respiratory: Effort normal breath sounds normal.  GI: Soft. Bowel sounds are normal.   Musculoskeletal: She exhibits no edemaor tenderness.  Neurological: She is alertand oriented.  Speech clear.  Follows commands without difficulty.  Motor: B/l UE motor 5/5 prox to distal.  B/l LE: 0/5 HF, KE and ADF/PF (stable).  Sensation diminished to LT below inguinal area (stable).  Skin: Skin is warmand dry. She is not diaphoretic.  Psychiatric: She has a normal mood and affect. Her speech is normaland behavior is normal. Judgmentand thought contentnormal. Cognition and memoryare normal.     Assessment/Plan: 1. 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 Physiatrist is providing close team supervision and 24 hour management of active medical problems listed below. Physiatrist and rehab team continue to assess barriers to discharge/monitor patient progress toward functional and medical goals.  Function:  Bathing Bathing position   Position: Bed  Bathing parts Body parts bathed by patient: Right upper leg, Left upper leg, Right lower leg, Left lower leg, Front perineal area, Abdomen, Chest, Left arm, Right arm Body parts bathed by helper: Buttocks, Back  Bathing assist Assist Level: Supervision or verbal cues (UB bathing only)      Upper Body Dressing/Undressing Upper body dressing   What is the patient wearing?: Pull over shirt/dress     Pull over shirt/dress - Perfomed by patient: Thread/unthread right sleeve, Thread/unthread left sleeve, Put head through opening, Pull shirt over trunk          Upper body assist Assist Level: Set up   Set up : To obtain clothing/put away  Lower Body Dressing/Undressing Lower body dressing   What is the patient wearing?: Ted Hose, Pants     Pants- Performed by patient: Thread/unthread right pants leg, Thread/unthread left pants leg, Pull pants up/down   Non-skid slipper socks- Performed by patient: Don/doff  right sock, Don/doff left sock Non-skid slipper socks- Performed by helper: Don/doff left sock, Don/doff right sock             TED Hose - Performed by patient: Don/doff right TED hose, Don/doff left TED hose TED Hose - Performed by helper: Don/doff right TED hose, Don/doff left TED hose  Lower body assist Assist for lower body dressing: Touching or steadying assistance (Pt > 75%)      Toileting Toileting Toileting activity did not occur: No continent bowel/bladder event   Toileting steps completed by  helper: Adjust clothing prior to toileting, Performs perineal hygiene, Adjust clothing after toileting Toileting Assistive Devices: Toilet aid  Toileting assist Assist level: Set up/obtain supplies   Transfers Chair/bed transfer   Chair/bed transfer method: Lateral scoot Chair/bed transfer assist level: Touching or steadying assistance (Pt > 75%) Chair/bed transfer assistive device: Sliding board Mechanical lift: Maximove   Locomotion Ambulation Ambulation activity did not occur: Safety/medical concerns         Wheelchair   Type: Manual Max wheelchair distance: 200' Assist Level: Supervision or verbal cues  Cognition Comprehension Comprehension assist level: Follows complex conversation/direction with no assist  Expression Expression assist level: Expresses complex ideas: With no assist  Social Interaction Social Interaction assist level: Interacts appropriately with others - No medications needed.  Problem Solving Problem solving assist level: Solves complex problems: Recognizes & self-corrects  Memory Memory assist level: Complete Independence: No helper    Medical Problem List and Plan: 1.  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  DVT:  LLE DVT - Xarelto started 3. Pain Management:   Paraesthesias controlled on Neurontin tid and Mobic daily, does not want to increase meds at present.   Tramadol PRN 4. Mood: LCSW to follow for evaluation and support.  5. Neuropsych: This patient is capable of making decisions on her own behalf. 6. Skin/Wound Care: Educate on importance of boosting and pressure relief measures.  7. Fluids/Electrolytes/Nutrition: Monitor I/O.  8. T2DM: new diagnosis with Hgb A1C-6.9 at admission.   Monitor BS ac/hs.    Educate patient on CM diet.   SSI for now  Slightly elevated 5/24, will consider medication tomorrow  Cont to monitor 9. Neurogenic bowel:  Incontinent  of bowel twice a day per patient. Changed miralax to Senna and scheduled suppository 10. Morbid obesity: Educate on appropriate diet. Will require bariatric equipment as well as skin monitoring to prevent breakdown.   11. Neurogenic bladder: foley d/ced with voiding trial  Low dose urecholine started, improved 12. Acute lower UTI  Ucx with Proteus and E.Coli  Resistant to macrobid, Keflex started 5/19 13. Hyponatremia  Na 136 on 5/22  Cont to monitor 14. Transaminitis: Resolved  Cont to monitor 15. ABLA  Hb 10.4 on 5/22  Cont to monitor 16. Hemorrhoids  Anusol ordered 5/24  LOS (Days) 9 A FACE TO FACE EVALUATION WAS PERFORMED  Ankit Lorie Phenix 01/07/2017 8:46 AM

## 2017-01-07 NOTE — Patient Care Conference (Signed)
Inpatient RehabilitationTeam Conference and Plan of Care Update Date: 01/06/2017   Time: 2:20 PM    Patient Name: Traci [name]      Medical Record Number: [id number]  Date of Birth: [birth year] Sex: Female         Room/Bed: 4M08C/4M08C-01 Payor Info: Payor: [employer] / Plan: [employer]ST / Product Type: *No Product type* /    Admitting Diagnosis: transverse myelilis  Admit Date/Time:  12/29/2016  5:26 PM Admission Comments: No comment available   Primary Diagnosis:  Thoracic myelopathy Principal Problem: Thoracic myelopathy  Patient Active Problem List   Diagnosis Date Noted  . Hemorrhoids   . Neuropathic pain   . Acute blood loss anemia   . Transaminitis   . Acute lower UTI   . Hyponatremia   . Peroneal DVT (deep venous thrombosis), left ([place])   . Thoracic myelopathy   . Neurogenic bladder   . Neurogenic bowel   . Bilateral leg weakness   . CNS demyelination ([place])   . Myelitis ([place])   . Paresthesia of both lower extremities   . Morbid obesity ([place])   . Spinal stenosis of lumbar region   . DDD (degenerative disc disease), lumbosacral   . Diabetes mellitus type 2 in obese ([place])   . Paraplegia ([place])   . Transverse myelitis ([place]) 12/25/2016  . Steroid-induced hyperglycemia 12/25/2016    Expected Discharge Date: Expected Discharge Date: 01/20/17  Team Members Present: Physician leading conference: Dr. 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  dulcolax supp  daily, pt had 3 large bm's on 5/22.  (P) maintain bowel with mod I assist  (P) monitor b/b q shift and prn   Swallow/Nutrition/ Hydration             ADL's   from bed level, min A LB dressing and dressing; needs assist with brief change due to incontinence  min A bathing, LB dressing, toileting, toilet transfers; mod A to shower; mod I UB self care and meal prep from w/c level  ADL training with adaptive techniques, functional transfers   Mobility   min/mod assist with slide board transfers, supervision-mod assist with bed mobilty, propelling w/c 200 ft with rest breaks.   Mod I transfers, bed mobility, w/c skills.   transfers, w/c management, activity tolerance, strengthening and balance as tolerated.    Communication             Safety/Cognition/ Behavioral Observations            Pain   (P) pt has occasional pain, neuropathic, to bilat LE, controlled with scheduled tylenol and gabapentin  (P) pain </=2 controlled with mod I assist  (P) montior pain q shift and prn    Skin   (P) masd to abdominal fold   (P) maintain skin with no breakdown with mod I assist  (P) monitor skin q shift and prn      *See Care Plan and progress notes for long and short-term goals.  Barriers to Discharge: DM, neurogenic  bladder, obesity, mobility, transfers, UTI, ABLA    Possible Resolutions to Barriers:  Therapies, optimize bladder meds, DM meds, abx, follow labs    Discharge Planning/Teaching Needs:  Pt decding if needs to go to family's in [place] or if she can manage with her roommate. Would need to be mod/i wheelchair level to be able to stay with her roommate.      Team Discussion:  Goals min assist wheelchair level. RN working on bowel program and has been incontinent of urine. May need to see if can self cath. MD treating UTI. Deciding between going home with roommate versus home in [place] both places all work and can not provide care. Difficult situation [employer] will not pay for NHP. Need  to come up with best plan  Revisions to Treatment Plan:  DC 6/6   Continued Need for Acute Rehabilitation Level of Care: The patient requires daily medical management by a physician with specialized training in physical medicine and rehabilitation for the following conditions: Daily direction of a multidisciplinary physical rehabilitation program to ensure safe treatment while eliciting the highest outcome that is of practical value to the patient.: Yes Daily medical management of patient stability for increased activity during participation in an intensive rehabilitation regime.: Yes Daily analysis of laboratory values and/or radiology reports with any subsequent need for medication adjustment of medical intervention for : Neurological problems;Diabetes problems;Other;Urological problems  Susana Gripp, Gardiner Rhyme 01/07/2017, 10:11 AM

## 2017-01-07 NOTE — Progress Notes (Signed)
Physical Therapy Weekly Progress Note  Patient Details  Name: Traci [name] MRN: [id number] Date of Birth: 04/03/1976  Beginning of progress report period: Dec 30, 2016 End of progress report period: Jan 07, 2017  Today's Date: 01/07/2017 PT Individual Time: 1501-1533 PT Individual Time Calculation (min): 32 min   Patient has met 4 of 4 short term goals.   Patient continues to demonstrate the following deficits muscle weakness and muscle paralysis, impaired timing and sequencing and decreased sitting balance and decreased postural control and therefore will continue to benefit from skilled PT intervention to increase functional independence with mobility. Pt I continuing to make steady gains with mobility. Working on bed mobility, transfers, sitting balance as well as w/c skills.   Patient progressing toward long term goals..  Continue plan of care.  PT Short Term Goals Week 2:  PT Short Term Goal 1 (Week 2): Pt to perform level sliding board transfers with supervision.   PT Short Term Goal 2 (Week 2): Pt to perform car transfer with min assist.  PT Short Term Goal 3 (Week 2): Pt to perform sitting>supine with supervision.  PT Short Term Goal 4 (Week 2): Pt to demonstrate pressure relief techniques and verbalize frequency.   Skilled Therapeutic Interventions/Progress Updates:  Ambulation/gait training;Balance/vestibular training;Cognitive remediation/compensation;Community reintegration;Discharge planning;Functional mobility training;Disease management/prevention;Neuromuscular re-education;Pain management;Patient/family education;Psychosocial support;Skin care/wound management;Stair training;Therapeutic Activities;Therapeutic Exercise;UE/LE Strength taining/ROM;UE/LE Coordination activities;Visual/perceptual remediation/compensation;Wheelchair propulsion/positioning;Functional electrical stimulation;DME/adaptive equipment instruction;Splinting/orthotics   Session: Pt sitting in w/c upon  arrival, agreeable to PT session. Pt propelling w/c in halls during session, with supervision. Up/down ramp X2. Standing frame X 10 min with UE tasks for reaching and trunk engagement, both single UE support and no UE support. Following session, pt returned to room. Sitting in w/c with all needs in reach.   Therapy Documentation Precautions:  Precautions Precautions: Fall Precaution Comments: BLE paraparesis Restrictions Weight Bearing Restrictions: Yes Pain: Reports burning pain in LEs. Monitored during session.   See Function Navigator for Current Functional Status.  Therapy/Group: Individual Therapy  Benjamin Zaino, PT 01/07/2017, 3:56 PM   

## 2017-01-08 ENCOUNTER — Inpatient Hospital Stay (HOSPITAL_COMMUNITY): Payer: PRIVATE HEALTH INSURANCE

## 2017-01-08 ENCOUNTER — Inpatient Hospital Stay (HOSPITAL_COMMUNITY): Payer: PRIVATE HEALTH INSURANCE | Admitting: Physical Therapy

## 2017-01-08 ENCOUNTER — Inpatient Hospital Stay (HOSPITAL_COMMUNITY): Payer: PRIVATE HEALTH INSURANCE | Admitting: Occupational Therapy

## 2017-01-08 LAB — GLUCOSE, CAPILLARY
GLUCOSE-CAPILLARY: 95 mg/dL (ref 65–99)
Glucose-Capillary: 90 mg/dL (ref 65–99)
Glucose-Capillary: 92 mg/dL (ref 65–99)
Glucose-Capillary: 162 mg/dL — ABNORMAL HIGH (ref 65–99)

## 2017-01-08 NOTE — Progress Notes (Signed)
Occupational Therapy Session Note  Patient Details  Name: Traci [name] MRN: [id number] Date of Birth: 10-Mar-1976  Today's Date: 01/08/2017 OT Individual Time: 1300-1400 OT Individual Time Calculation (min): 60 min    Short Term Goals: Week 2:  OT Short Term Goal 1 (Week 2): Pt will complete toilet hygiene with max assist in sitting with lateral leans side to side OT Short Term Goal 2 (Week 2): Pt will perform sliding board transfers with min assist to the drop arm commode.    OT Short Term Goal 3 (Week 2): Pt will complete LB dressing in long sitting/circle sit to supine with min assist. OT Short Term Goal 4 (Week 2): Pt will complete LB bathing supine rolling side to side with min assist.   Skilled Therapeutic Interventions/Progress Updates:    Pt seen for OT session focusing on functional transfers and mobility in prep for ADL tasks. Pt in supine upon arrival, agreeable to tx session.  From bed level, pt able to bring self into long sitting position to thread pants, she returned to supine and with use of bed rails able to roll R/L in order to pull pants up. She transferred to EOB and completed sliding board transfer to w/c with min A and assist to set-up sliding board and LE management. She self propelled w/c to therapy gym, requiring one short rest break. She completed sliding board transfer to therapy mat, slightly inclined transfer with significantly increased time and VCs for head/hip relationship. From supine on mat, pt able to walk UEs forward to position herself in long sitting/ modified circle sit position to don and doff B socks. She returned to [facility] and sliding board transfer completed back into w/c. She returned to room at end of session, left at sink competing grooming tasks and call bell present.  Educated regarding d/c planning including pros/ cons of hospital bed and functional implications of decisions including raising/ lowering for sliding board transfers and use to assist with  bed mobility and bed level ADLs.   Therapy Documentation Precautions:  Precautions Precautions: Fall Precaution Comments: BLE paraparesis Restrictions Weight Bearing Restrictions: No Pain: Pain Assessment Pain Score: No/ denies pain  See Function Navigator for Current Functional Status.   Therapy/Group: Individual Therapy  [name], Traci Karow C 01/08/2017, 7:12 AM

## 2017-01-08 NOTE — Progress Notes (Signed)
[facility] PHYSICAL MEDICINE & REHABILITATION     PROGRESS NOTE  Subjective/Complaints:  Pt seen working with therapies this AM.  She slept well overnight.  She notes improvement in sensation in LE.  ROS: Denies CP, SOB, N/V/D.  Objective: Vital Signs: Blood pressure 140/68, pulse 73, temperature 98 F (36.7 C), temperature source Oral, resp. rate 18, height 5' (1.524 m), weight 119 kg (262 lb 6.4 oz), SpO2 98 %. No results found. No results for input(s): WBC, HGB, HCT, PLT in the last 72 hours. No results for input(s): NA, K, CL, GLUCOSE, BUN, CREATININE, CALCIUM in the last 72 hours.  Invalid input(s): CO CBG (last 3)   Recent Labs  01/07/17 1651 01/07/17 2100 01/08/17 0618  GLUCAP 88 124* 95    Wt Readings from Last 3 Encounters:  12/29/16 119 kg (262 lb 6.4 oz)  12/25/16 131.7 kg (290 lb 6.4 oz)  12/17/16 127.9 kg (282 lb)    Physical Exam:  BP 140/68 (BP Location: Left Arm)   Pulse 73   Temp 98 F (36.7 C) (Oral)   Resp 18   Ht 5' (1.524 m)   Wt 119 kg (262 lb 6.4 oz)   SpO2 98%   BMI 51.25 kg/m  Constitutional: She appears well-developed. No distress. Obese.  HENT: Normocephalicand atraumatic.  Eyes: EOMI. No discharge.  Cardiovascular: RRR. No JVD. Respiratory: Effort normal breath sounds normal.  GI: Soft. Bowel sounds are normal.   Musculoskeletal: She exhibits no edemaor tenderness.  Neurological: She is alertand oriented.  Speech clear.  Follows commands without difficulty.  Motor: B/l UE motor 5/5 prox to distal.  B/l LE: 0/5 HF, KE and ADF/PF (unchanged).  Sensation diminished to LT below inguinal area (improving).  Skin: Skin is warmand dry. She is not diaphoretic.  Psychiatric: She has a normal mood and affect. Her speech is normaland behavior is normal. Judgmentand thought contentnormal. Cognition and memoryare normal.    Assessment/Plan: 1. Functional deficits secondary to Transverse Myelitis which require 3+ hours per day of  interdisciplinary therapy in a comprehensive inpatient rehab setting. Physiatrist is providing close team supervision and 24 hour management of active medical problems listed below. Physiatrist and rehab team continue to assess barriers to discharge/monitor patient progress toward functional and medical goals.  Function:  Bathing Bathing position   Position: Bed  Bathing parts Body parts bathed by patient: Right upper leg, Left upper leg, Right lower leg, Left lower leg, Front perineal area, Abdomen, Chest, Left arm, Right arm Body parts bathed by helper: Buttocks, Back  Bathing assist Assist Level: Supervision or verbal cues (UB bathing only)      Upper Body Dressing/Undressing Upper body dressing   What is the patient wearing?: Pull over shirt/dress     Pull over shirt/dress - Perfomed by patient: Thread/unthread right sleeve, Thread/unthread left sleeve, Put head through opening, Pull shirt over trunk          Upper body assist Assist Level: Set up   Set up : To obtain clothing/put away  Lower Body Dressing/Undressing Lower body dressing   What is the patient wearing?: Ted Hose, Pants     Pants- Performed by patient: Thread/unthread right pants leg, Thread/unthread left pants leg, Pull pants up/down   Non-skid slipper socks- Performed by patient: Don/doff right sock, Don/doff left sock Non-skid slipper socks- Performed by helper: Don/doff left sock, Don/doff right sock             TED Hose - Performed by patient: Don/doff  right TED hose, Don/doff left TED hose TED Hose - Performed by helper: Don/doff right TED hose, Don/doff left TED hose  Lower body assist Assist for lower body dressing: Touching or steadying assistance (Pt > 75%)      Toileting Toileting Toileting activity did not occur: No continent bowel/bladder event   Toileting steps completed by helper: Adjust clothing prior to toileting, Performs perineal hygiene, Adjust clothing after toileting Toileting  Assistive Devices: Toilet aid  Toileting assist Assist level: Set up/obtain supplies   Transfers Chair/bed transfer   Chair/bed transfer method: Lateral scoot Chair/bed transfer assist level: Touching or steadying assistance (Pt > 75%) Chair/bed transfer assistive device: Sliding board Mechanical lift: Maximove   Locomotion Ambulation Ambulation activity did not occur: Safety/medical concerns         Wheelchair   Type: Manual Max wheelchair distance: 180 ft Assist Level: Supervision or verbal cues  Cognition Comprehension Comprehension assist level: Follows complex conversation/direction with no assist  Expression Expression assist level: Expresses complex ideas: With no assist  Social Interaction Social Interaction assist level: Interacts appropriately with others - No medications needed.  Problem Solving Problem solving assist level: Solves complex problems: Recognizes & self-corrects  Memory Memory assist level: Complete Independence: No helper    Medical Problem List and Plan: 1.  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  DVT:  LLE DVT - Xarelto started 3. Pain Management:   Paraesthesias controlled on Neurontin tid and Mobic daily, does not want to increase meds at present.   Tramadol PRN 4. Mood: LCSW to follow for evaluation and support.  5. Neuropsych: This patient is capable of making decisions on her own behalf. 6. Skin/Wound Care: Educate on importance of boosting and pressure relief measures.  7. Fluids/Electrolytes/Nutrition: Monitor I/O.  8. T2DM: new diagnosis with Hgb A1C-6.9 at admission.   Monitor BS ac/hs.    Educate patient on CM diet.   SSI for now  Labile, overall controlled 5/25  Cont to monitor 9. Neurogenic bowel:  Incontinent of bowel twice a day per patient. Changed miralax to Senna and scheduled suppository 10. Morbid obesity: Educate on appropriate diet.  Will require bariatric equipment as well as skin monitoring to prevent breakdown.   11. Neurogenic bladder: foley d/ced with voiding trial  Low dose urecholine started, improved 12. Acute lower UTI  Ucx with Proteus and E.Coli  Resistant to macrobid, Keflex started 5/19-5/26 13. Hyponatremia  Na 136 on 5/22  Labs ordered for Monday  Cont to monitor 14. Transaminitis: Resolved  Cont to monitor 15. ABLA  Hb 10.4 on 5/22  Labs ordered for Monday  Cont to monitor 16. Hemorrhoids  Anusol ordered 5/24  LOS (Days) 10 A FACE TO FACE EVALUATION WAS PERFORMED  Ankit Lorie Phenix 01/08/2017 8:19 AM

## 2017-01-08 NOTE — Progress Notes (Signed)
Physical Therapy Session Note  Patient Details  Name: Traci [name] MRN: [id number] Date of Birth: [birth year]  Today's Date: 01/08/2017 PT Individual Time: 0901-0956 PT Individual Time Calculation (min): 55 min   Short Term Goals: Week 2:  PT Short Term Goal 1 (Week 2): Pt to perform level sliding board transfers with supervision.   PT Short Term Goal 2 (Week 2): Pt to perform car transfer with min assist.  PT Short Term Goal 3 (Week 2): Pt to perform sitting>supine with supervision.  PT Short Term Goal 4 (Week 2): Pt to demonstrate pressure relief techniques and verbalize frequency.   Skilled Therapeutic Interventions/Progress Updates:    Pt sitting in w/c upon arrival. Reports that she does have increased sensation in thighs. Pt able to localize touch at anterior thighs. W/C: pt propelling w/c 250 ft with supervision, intermittent rest breaks as needed. Transfers: w/c<>mat table, w/c>bed - performed with slide board and min-mod assist. Pt requiring assist with LEs on transfer from mat table with hips sliding forward. Working on [employer] as well as head/hips relationship. Exercise: seated press ups, encouraging scapular depression as able. During session, pt having episode on incontinence. Returned to room and bed. Pt requesting nursing provide assistance with cleaning. Pt requesting that she will call. Pt in bed with all needs in reach.   Therapy Documentation Precautions:  Precautions Precautions: Fall Precaution Comments: BLE paraparesis Restrictions Weight Bearing Restrictions: No General: PT Amount of Missed Time (min): 5 Minutes PT Missed Treatment Reason: Bowel/bladder accident Pain: Reports tingling/burning in LEs.  See Function Navigator for Current Functional Status.   Therapy/Group: Individual Therapy  Linard Millers, PT 01/08/2017, 4:09 PM

## 2017-01-08 NOTE — Progress Notes (Signed)
Occupational Therapy Session Note  Patient Details  Name: Traci [name] MRN: [id number] Date of Birth: 24-Feb-1976  Today's Date: 01/08/2017 OT Individual Time: 6895-7022 OT Individual Time Calculation (min): 72 min    Short Term Goals: Week 2:  OT Short Term Goal 1 (Week 2): Pt will complete toilet hygiene with max assist in sitting with lateral leans side to side OT Short Term Goal 2 (Week 2): Pt will perform sliding board transfers with min assist to the drop arm commode.    OT Short Term Goal 3 (Week 2): Pt will complete LB dressing in long sitting/circle sit to supine with min assist. OT Short Term Goal 4 (Week 2): Pt will complete LB bathing supine rolling side to side with min assist.   Skilled Therapeutic Interventions/Progress Updates:    1:1. No report of pain. Pt roll B with supervision and able to bring LE over to EOB to eat with set up. Pt bathing EOB for UB and use of long handled sponge to wash BLE. Pt EOB>supine with A for LE management. Pt washes peri area but requires A to wash buttocks. HOB 10* supine>long sitting with supervision and Vc to push to elbows. Pt dons shirt, ted hose and socks in long sitting with set up. Pt dons pants with supervision and VC for rolling B to advance pants past hips. Pt transfers EOB>w/c with CGA using stool under legs for support. Pt brushes teeth seated in w/c with set up. Exited session with pt seated in w/c with call light in reach and all needs met.  Therapy Documentation Precautions:  Precautions Precautions: Fall Precaution Comments: BLE paraparesis Restrictions Weight Bearing Restrictions: No  See Function Navigator for Current Functional Status.   Therapy/Group: Individual Therapy  Tonny Branch 01/08/2017, 10:17 AM

## 2017-01-09 DIAGNOSIS — E871 Hypo-osmolality and hyponatremia: Secondary | ICD-10-CM

## 2017-01-09 LAB — GLUCOSE, CAPILLARY
GLUCOSE-CAPILLARY: 123 mg/dL — AB (ref 65–99)
GLUCOSE-CAPILLARY: 89 mg/dL (ref 65–99)
Glucose-Capillary: 122 mg/dL — ABNORMAL HIGH (ref 65–99)
Glucose-Capillary: 84 mg/dL (ref 65–99)

## 2017-01-09 NOTE — Progress Notes (Signed)
Traci [name] is a 41 y.o. female [birth year] [id number]  Subjective: Asleep No new problems.   Objective: Vital signs in last 24 hours: Temp:  [98.3 F (36.8 C)-98.9 F (37.2 C)] 98.3 F (36.8 C) (05/26 1305) Pulse Rate:  [72-80] 72 (05/26 1305) Resp:  [16-17] 17 (05/26 1305) BP: (121-123)/(61-64) 121/61 (05/26 1305) SpO2:  [99 %] 99 % (05/26 1305) Weight change:  Last BM Date: 01/09/17  Intake/Output from previous day: 05/25 0701 - 05/26 0700 In: 360 [P.O.:360] Out: 550 [Urine:550] Last cbgs: CBG (last 3)   Recent Labs  01/09/17 0631 01/09/17 1130 01/09/17 1628  GLUCAP 122* 123* 84     Physical Exam General: No apparent distress   HEENT: not dry Lungs: Normal effort. Lungs clear to auscultation, no crackles or wheezes. Cardiovascular: Regular rate and rhythm, no edema Abdomen: S/NT/ND; BS(+) Musculoskeletal:  unchanged Neurological: No new neurological deficits Wounds: N/A    Skin: clear  Mental state: Asleep    Lab Results: BMET    Component Value Date/Time   NA 136 01/05/2017 0446   NA 140 09/29/2016 1540   K 4.0 01/05/2017 0446   CL 101 01/05/2017 0446   CO2 28 01/05/2017 0446   GLUCOSE 119 (H) 01/05/2017 0446   BUN 11 01/05/2017 0446   BUN 13 09/29/2016 1540   CREATININE 0.63 01/05/2017 0446   CALCIUM 9.3 01/05/2017 0446   GFRNONAA >60 01/05/2017 0446   GFRAA >60 01/05/2017 0446   CBC    Component Value Date/Time   WBC 6.7 01/05/2017 0446   RBC 4.14 01/05/2017 0446   HGB 10.4 (L) 01/05/2017 0446   HCT 33.5 (L) 01/05/2017 0446   PLT 182 01/05/2017 0446   MCV 80.9 01/05/2017 0446   MCH 25.1 (L) 01/05/2017 0446   MCHC 31.0 01/05/2017 0446   RDW 15.7 (H) 01/05/2017 0446   LYMPHSABS 2.4 01/04/2017 0915   MONOABS 0.4 01/04/2017 0915   EOSABS 0.1 01/04/2017 0915   BASOSABS 0.0 01/04/2017 0915    Studies/Results: No results found.  Medications: I have reviewed the patient's current medications.  Assessment/Plan:   1. Paraplegia  and sensory deficitssecondary to Transverse Myelitis involving thoracic and lumbar cord. Cont CIR 2. LLE DVT - Xarelto 3. Paresthesias - Gabapentin po 4. DM 2. SSI. ADA diet 5. UTI - o Keflex 6. Hyponattremia - monitoring BMET 7. Anemia - monitoring CBC 8. Hemorrhoids - Anusol 9. Pain - Tramadol prn     Length of stay, days: 11  Walker Kehr , MD 01/09/2017, 4:49 PM

## 2017-01-10 LAB — GLUCOSE, CAPILLARY
GLUCOSE-CAPILLARY: 98 mg/dL (ref 65–99)
GLUCOSE-CAPILLARY: 153 mg/dL — AB (ref 65–99)
Glucose-Capillary: 113 mg/dL — ABNORMAL HIGH (ref 65–99)
Glucose-Capillary: 112 mg/dL — ABNORMAL HIGH (ref 65–99)

## 2017-01-10 NOTE — Progress Notes (Signed)
Traci [name] is a 41 y.o. female 28-Nov-1975 [id number]  Subjective: No new complaints. No new problems. Slept well. Feeling OK.  Objective: Vital signs in last 24 hours: Temp:  [98.4 F (36.9 C)-98.5 F (36.9 C)] 98.5 F (36.9 C) (05/27 1238) Pulse Rate:  [74-76] 76 (05/27 1238) Resp:  [16-17] 17 (05/27 1238) BP: (136-137)/(70-71) 137/71 (05/27 1238) SpO2:  [99 %-100 %] 100 % (05/27 1238) Weight change:  Last BM Date: 01/09/17  Intake/Output from previous day: 05/26 0701 - 05/27 0700 In: 840 [P.O.:840] Out: 500 [Urine:500] Last cbgs: CBG (last 3)   Recent Labs  01/09/17 2033 01/10/17 0635 01/10/17 1121  GLUCAP 89 98 113*     Physical Exam General: No apparent distress  Obese HEENT: not dry Lungs: Normal effort. Lungs clear to auscultation, no crackles or wheezes. Cardiovascular: Regular rate and rhythm, no edema Abdomen: S/NT/ND; BS(+) Musculoskeletal:  unchanged Neurological: No new neurological deficits Wounds: N/A    Skin: clear  Aging changes Mental state: Alert, oriented, cooperative    Lab Results: BMET    Component Value Date/Time   NA 136 01/05/2017 0446   NA 140 09/29/2016 1540   K 4.0 01/05/2017 0446   CL 101 01/05/2017 0446   CO2 28 01/05/2017 0446   GLUCOSE 119 (H) 01/05/2017 0446   BUN 11 01/05/2017 0446   BUN 13 09/29/2016 1540   CREATININE 0.63 01/05/2017 0446   CALCIUM 9.3 01/05/2017 0446   GFRNONAA >60 01/05/2017 0446   GFRAA >60 01/05/2017 0446   CBC    Component Value Date/Time   WBC 6.7 01/05/2017 0446   RBC 4.14 01/05/2017 0446   HGB 10.4 (L) 01/05/2017 0446   HCT 33.5 (L) 01/05/2017 0446   PLT 182 01/05/2017 0446   MCV 80.9 01/05/2017 0446   MCH 25.1 (L) 01/05/2017 0446   MCHC 31.0 01/05/2017 0446   RDW 15.7 (H) 01/05/2017 0446   LYMPHSABS 2.4 01/04/2017 0915   MONOABS 0.4 01/04/2017 0915   EOSABS 0.1 01/04/2017 0915   BASOSABS 0.0 01/04/2017 0915    Studies/Results: No results found.  Medications: I have  reviewed the patient's current medications.  Assessment/Plan:    1. Paraplegia and sensory deficitssecondary to Transverse Myelitis involving thoracic and lumbar cord. Cont CIR 2. Parast1. Paraplegia and sensory deficitssecondary to Transverse Myelitis involving thoracic and lumbar cord. Cont CIRhesias - -- Gabapentin po 3. DM 2. SSI 4. UTI --- Keflex 5. Pain control - Tramadol prn 6. DVT prophylaxis 7. Anemia - CBC checks  Length of stay, days: 12  Walker Kehr , MD 01/10/2017, 2:15 PM

## 2017-01-11 ENCOUNTER — Inpatient Hospital Stay (HOSPITAL_COMMUNITY): Payer: PRIVATE HEALTH INSURANCE | Admitting: Physical Therapy

## 2017-01-11 ENCOUNTER — Inpatient Hospital Stay (HOSPITAL_COMMUNITY): Payer: PRIVATE HEALTH INSURANCE | Admitting: Occupational Therapy

## 2017-01-11 LAB — CBC WITH DIFFERENTIAL/PLATELET
BASOS PCT: 0 %
Basophils Absolute: 0 10*3/uL (ref 0.0–0.1)
Eosinophils Absolute: 0.2 10*3/uL (ref 0.0–0.7)
Eosinophils Relative: 3 %
HEMATOCRIT: 33.4 % — AB (ref 36.0–46.0)
Hemoglobin: 10.4 g/dL — ABNORMAL LOW (ref 12.0–15.0)
LYMPHS ABS: 1.9 10*3/uL (ref 0.7–4.0)
Lymphocytes Relative: 26 %
MCH: 25.4 pg — ABNORMAL LOW (ref 26.0–34.0)
MCHC: 31.1 g/dL (ref 30.0–36.0)
MCV: 81.5 fL (ref 78.0–100.0)
MONOS PCT: 6 %
Monocytes Absolute: 0.5 10*3/uL (ref 0.1–1.0)
NEUTROS ABS: 4.7 10*3/uL (ref 1.7–7.7)
NEUTROS PCT: 65 %
Platelets: 157 10*3/uL (ref 150–400)
RBC: 4.1 MIL/uL (ref 3.87–5.11)
RDW: 16.1 % — ABNORMAL HIGH (ref 11.5–15.5)
WBC: 7.3 10*3/uL (ref 4.0–10.5)

## 2017-01-11 LAB — BASIC METABOLIC PANEL
ANION GAP: 9 (ref 5–15)
BUN: 11 mg/dL (ref 6–20)
CALCIUM: 8.9 mg/dL (ref 8.9–10.3)
CHLORIDE: 105 mmol/L (ref 101–111)
CO2: 25 mmol/L (ref 22–32)
Creatinine, Ser: 0.57 mg/dL (ref 0.44–1.00)
GFR calc non Af Amer: >60 mL/min (ref >60)
Glucose, Bld: 130 mg/dL — ABNORMAL HIGH (ref 65–99)
Potassium: 3.8 mmol/L (ref 3.5–5.1)
SODIUM: 139 mmol/L (ref 135–145)

## 2017-01-11 LAB — GLUCOSE, CAPILLARY
GLUCOSE-CAPILLARY: 113 mg/dL — AB (ref 65–99)
GLUCOSE-CAPILLARY: 78 mg/dL (ref 65–99)
GLUCOSE-CAPILLARY: 86 mg/dL (ref 65–99)
GLUCOSE-CAPILLARY: 90 mg/dL (ref 65–99)

## 2017-01-11 NOTE — Progress Notes (Signed)
Physical Therapy Session Note  Patient Details  Name: Traci [name] MRN: [id number] Date of Birth: 06-May-1976  Today's Date: 01/11/2017 PT Individual Time: 1001-1100 PT Individual Time Calculation (min): 59 min   Short Term Goals: Week 2:  PT Short Term Goal 1 (Week 2): Pt to perform level sliding board transfers with supervision.   PT Short Term Goal 2 (Week 2): Pt to perform car transfer with min assist.  PT Short Term Goal 3 (Week 2): Pt to perform sitting>supine with supervision.  PT Short Term Goal 4 (Week 2): Pt to demonstrate pressure relief techniques and verbalize frequency.   Skilled Therapeutic Interventions/Progress Updates:    Pt sitting in w/c upon arrival, agreeable to PT session. Pt propelling 200 ft with supervision. Reviewed w/c parts management for removal of Leg rests. Bed Mobility: working on rolling bilaterally with leg loops. Supine<>long sitting X5 with supervision. Transfers: w/c<>mat table and w/c>bed performed. Working on board placement, head/hips relationship, and performing without LE support. Following session, pt requesting return to bed due to incontinence. States that she will have nursing assist her for cleaning. Pt with all needs in reach.   Therapy Documentation Precautions:  Precautions Precautions: Fall Precaution Comments: BLE paraparesis Restrictions Weight Bearing Restrictions: No    Pain:  Denies pain, has burning and tingling in thighs.   See Function Navigator for Current Functional Status.   Therapy/Group: Individual Therapy  Linard Millers, PT 01/11/2017, 12:23 PM

## 2017-01-11 NOTE — Progress Notes (Signed)
Occupational Therapy Session Note  Patient Details  Name: Traci [name] MRN: [id number] Date of Birth: 03/30/1976  Today's Date: 01/11/2017 OT Individual Time: 2751-7001 and 7494-4967 OT Individual Time Calculation (min): 59 min and 49 min   Short Term Goals: Week 1:  OT Short Term Goal 1 (Week 1): Pt will complete LB bathing sit to supine with mod assist.  OT Short Term Goal 1 - Progress (Week 1): Met OT Short Term Goal 2 (Week 1): Pt will donn pullup shorts/pants wiith mod assist in supine position rolling. OT Short Term Goal 2 - Progress (Week 1): Met OT Short Term Goal 3 (Week 1): Pt will complete sliding board transfer to the drop arm commode with max assist.   OT Short Term Goal 3 - Progress (Week 1): Met OT Short Term Goal 4 (Week 1): Pt complete toilet hygiene and clothing with max assist using lateral leans in sitting.  OT Short Term Goal 4 - Progress (Week 1): Not met Week 2:  OT Short Term Goal 1 (Week 2): Pt will complete toilet hygiene with max assist in sitting with lateral leans side to side OT Short Term Goal 2 (Week 2): Pt will perform sliding board transfers with min assist to the drop arm commode.    OT Short Term Goal 3 (Week 2): Pt will complete LB dressing in long sitting/circle sit to supine with min assist. OT Short Term Goal 4 (Week 2): Pt will complete LB bathing supine rolling side to side with min assist.     Skilled Therapeutic Interventions/Progress Updates:    Tx focus on endurance, d/c planning, functional transfers, and AE use during self care tasks.   Pt greeted supine in bed. Declined bathing, but completed LB dressing (including pants, Ted hose, gripper socks) with setup. Had her practice rolling L>R without bedrails for lifting pants over hips (per setup at home). Lower bedrails elevated during this time and OT was on turning side for safety. Had her practice using leg loops to place LEs in preparatory position for rolling. Pt elevating one arm and using  her momentum to turn to side. With extra time and collaboration regarding adaptive techniques, pt unable to do so without max A. Discussed functional implications of this at discharge. Also engaged in long discussion about impending d/c. Pt does not know where she will go home on 6/6. Pt undecided whether she'll go home with sister (in [place]- 2 hours away) or with roommate here in [place]. Both caregivers would be unable to provide 24/7 assist (and her sister has a family, including baby). Discussed going home with roommate, due to w/c accessibility, having friends/in-home services come in during the day to provide assist PRN. Strongly encouraged her to solidify d/c plan this week! Had her engage in directing care during functional slideboard transfer to w/c. Min cues for safe setup. Min A transfer with OT supporting LEs. Pt positioned herself in w/c with setup, including application of pool noodle/leg rests. She then completed grooming tasks/oral care with setup. Used comb to turn on her light switch. She was left in w/c with all needs within reach at time of departure.   2nd Session 1:1 tx (49 min) Tx focus on endurance and UB strength during LB dressing.   Pt supine in bed at time of arrival, agreeable to tx. With HOB flat, pt transitioned from supine to long sitting by flexing trunk and using elbows. Pt donning pants, gripper socks, and leg loops with increased time. Pt lifting pants  over hips by rolling R>L (with bedrails up for safety, but with pt using momentum/mattress to assist with rolling only). Pt positioning LEs with leg loops PRN. Rolling took majority of session, pt very determined to complete this without bedrails. Pt actively problem solving and taking rest breaks due to strength deficits. At end of tx pt was left with all needs within reach.   Therapy Documentation Precautions:  Precautions Precautions: Fall Precaution Comments: BLE paraparesis Restrictions Weight Bearing Restrictions:  No   Pain: No c/o pain during tx    ADL:      See Function Navigator for Current Functional Status.   Therapy/Group: Individual Therapy  Michaela A Hoffman 01/11/2017, 10:29 AM

## 2017-01-11 NOTE — Progress Notes (Signed)
[place] PHYSICAL MEDICINE & REHABILITATION     PROGRESS NOTE  Subjective/Complaints:  Pt laying in bed this AM.  She slept well overnight and had a good weekend.   ROS: Denies CP, SOB, N/V/D.  Objective: Vital Signs: Blood pressure 136/76, pulse 62, temperature 98.1 F (36.7 C), temperature source Oral, resp. rate 16, height 5' (1.524 m), weight 119 kg (262 lb 6.4 oz), SpO2 100 %. No results found.  Recent Labs  01/11/17 0652  WBC 7.3  HGB 10.4*  HCT 33.4*  PLT 157    Recent Labs  01/11/17 0652  NA 139  K 3.8  CL 105  GLUCOSE 130*  BUN 11  CREATININE 0.57  CALCIUM 8.9   CBG (last 3)   Recent Labs  01/10/17 1617 01/10/17 2114 01/11/17 0631  GLUCAP 153* 112* 113*    Wt Readings from Last 3 Encounters:  12/29/16 119 kg (262 lb 6.4 oz)  12/25/16 131.7 kg (290 lb 6.4 oz)  12/17/16 127.9 kg (282 lb)    Physical Exam:  BP 136/76 (BP Location: Left Arm)   Pulse 62   Temp 98.1 F (36.7 C) (Oral)   Resp 16   Ht 5' (1.524 m)   Wt 119 kg (262 lb 6.4 oz)   SpO2 100%   BMI 51.25 kg/m  Constitutional: She appears well-developed. No distress. Obese.  HENT: Normocephalicand atraumatic.  Eyes: EOMI. No discharge.  Cardiovascular: RRR. No JVD. Respiratory: Effort normal breath sounds normal.  GI: Soft. Bowel sounds are normal.   Musculoskeletal: She exhibits no edemaor tenderness.  Neurological: She is alertand oriented.  Speech clear.  Follows commands without difficulty.  Motor: B/l UE motor 5/5 prox to distal.  B/l LE: 0/5 HF, KE and ADF/PF (stable).  Sensation diminished to LT below inguinal area (improving).  Skin: Skin is warmand dry. She is not diaphoretic.  Psychiatric: She has a normal mood and affect. Her speech is normaland behavior is normal. Judgmentand thought contentnormal. Cognition and memoryare normal.    Assessment/Plan: 1. Functional deficits secondary to Transverse Myelitis which require 3+ hours per day of interdisciplinary  therapy in a comprehensive inpatient rehab setting. Physiatrist is providing close team supervision and 24 hour management of active medical problems listed below. Physiatrist and rehab team continue to assess barriers to discharge/monitor patient progress toward functional and medical goals.  Function:  Bathing Bathing position   Position: Bed  Bathing parts Body parts bathed by patient: Right upper leg, Left upper leg, Right lower leg, Left lower leg, Front perineal area, Abdomen, Chest, Left arm, Right arm Body parts bathed by helper: Buttocks, Back  Bathing assist Assist Level: Touching or steadying assistance(Pt > 75%)      Upper Body Dressing/Undressing Upper body dressing   What is the patient wearing?: Pull over shirt/dress     Pull over shirt/dress - Perfomed by patient: Thread/unthread right sleeve, Thread/unthread left sleeve, Put head through opening, Pull shirt over trunk          Upper body assist Assist Level: Set up   Set up : To obtain clothing/put away  Lower Body Dressing/Undressing Lower body dressing   What is the patient wearing?: Pants, Non-skid slipper socks     Pants- Performed by patient: Thread/unthread right pants leg, Thread/unthread left pants leg, Pull pants up/down   Non-skid slipper socks- Performed by patient: Don/doff right sock, Don/doff left sock Non-skid slipper socks- Performed by helper: Don/doff left sock, Don/doff right sock  TED Hose - Performed by patient: Don/doff right TED hose, Don/doff left TED hose TED Hose - Performed by helper: Don/doff right TED hose, Don/doff left TED hose  Lower body assist Assist for lower body dressing: Supervision or verbal cues      Toileting Toileting Toileting activity did not occur: No continent bowel/bladder event   Toileting steps completed by helper: Adjust clothing prior to toileting, Performs perineal hygiene, Adjust clothing after toileting Toileting Assistive Devices:  Toilet aid  Toileting assist Assist level: Set up/obtain supplies   Transfers Chair/bed transfer   Chair/bed transfer method: Lateral scoot Chair/bed transfer assist level: Touching or steadying assistance (Pt > 75%) Chair/bed transfer assistive device: Sliding board Mechanical lift: Maximove   Locomotion Ambulation Ambulation activity did not occur: Safety/medical concerns         Wheelchair   Type: Manual Max wheelchair distance: 250 ft Assist Level: Supervision or verbal cues  Cognition Comprehension Comprehension assist level: Follows complex conversation/direction with no assist  Expression Expression assist level: Expresses complex ideas: With no assist  Social Interaction Social Interaction assist level: Interacts appropriately with others - No medications needed.  Problem Solving Problem solving assist level: Solves complex problems: Recognizes & self-corrects  Memory Memory assist level: Complete Independence: No helper    Medical Problem List and Plan: 1.  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  DVT: LLE DVT - Xarelto started 3. Pain Management:   Paraesthesias controlled on Neurontin tid and Mobic daily, does not want to increase meds at present.   Tramadol PRN 4. Mood: LCSW to follow for evaluation and support.  5. Neuropsych: This patient is capable of making decisions on her own behalf. 6. Skin/Wound Care: Educate on importance of boosting and pressure relief measures.  7. Fluids/Electrolytes/Nutrition: Monitor I/O.  8. T2DM: new diagnosis with Hgb A1C-6.9 at admission.   Monitor BS ac/hs.    Educate patient on CM diet.   SSI for now  Controlled 5/28  Cont to monitor 9. Neurogenic bowel:  Incontinent of bowel twice a day per patient. Changed miralax to Senna and scheduled suppository 10. Morbid obesity: Educate on appropriate diet. Will require bariatric equipment as  well as skin monitoring to prevent breakdown.   11. Neurogenic bladder: foley d/ced with voiding trial  Low dose urecholine started, improved 12. Acute lower UTI  Ucx with Proteus and E.Coli  Resistant to macrobid, Keflex completed 5/19-5/26 13. Hyponatremia  Na 139 on 5/28  Cont to monitor 14. Transaminitis: Resolved  Cont to monitor 15. ABLA  Hb 10.4 on 5/28  Cont to monitor 16. Hemorrhoids  Anusol ordered 5/24  LOS (Days) 13 A FACE TO FACE EVALUATION WAS PERFORMED  Nayda Riesen Lorie Phenix 01/11/2017 10:01 AM

## 2017-01-11 NOTE — Progress Notes (Signed)
Physical Therapy Session Note  Patient Details  Name: Traci [name] MRN: [id number] Date of Birth: 01-15-1976  Today's Date: 01/11/2017 PT Individual Time: 0525-9102 PT Individual Time Calculation (min): 20 min   Short Term Goals: Week 2:  PT Short Term Goal 1 (Week 2): Pt to perform level sliding board transfers with supervision.   PT Short Term Goal 2 (Week 2): Pt to perform car transfer with min assist.  PT Short Term Goal 3 (Week 2): Pt to perform sitting>supine with supervision.  PT Short Term Goal 4 (Week 2): Pt to demonstrate pressure relief techniques and verbalize frequency.   Skilled Therapeutic Interventions/Progress Updates:    pt resting in bed, reporting fatigue from previous OT session but no pain.  Session focus on bed mobility and head/hips for transfers.  Pt able to come supine>sit with bed rails and more than a reasonable amount of time, with verbal cues for use of UEs to aid bringing LLE to EOB.  Pt able to place slide board with min assist from PT and initiate slide board transfer bed>w/c (bed elevated for level transfer) without use of step stool for foot support.  Pt requires increased time, steady assist, and verbal cues for forward weight shift and head/hips relationship.  Pt noted herself to be incontinent of stool during transfer, and completes slide board back to bed, mod assist for LEs into bed.  Pt requesting to have assist from nursing staff for incontinence.  Left supine in bed with call bell in reach and needs met.   Therapy Documentation Precautions:  Precautions Precautions: Fall Precaution Comments: BLE paraparesis Restrictions Weight Bearing Restrictions: No General: PT Amount of Missed Time (min): 10 Minutes PT Missed Treatment Reason: Bowel/bladder accident   See Function Navigator for Current Functional Status.   Therapy/Group: Individual Therapy  Earnest Conroy Penven-Crew 01/11/2017, 3:32 PM

## 2017-01-12 ENCOUNTER — Inpatient Hospital Stay (HOSPITAL_COMMUNITY): Payer: PRIVATE HEALTH INSURANCE | Admitting: Physical Therapy

## 2017-01-12 ENCOUNTER — Inpatient Hospital Stay (HOSPITAL_COMMUNITY): Payer: PRIVATE HEALTH INSURANCE | Admitting: Occupational Therapy

## 2017-01-12 LAB — GLUCOSE, CAPILLARY
GLUCOSE-CAPILLARY: 127 mg/dL — AB (ref 65–99)
GLUCOSE-CAPILLARY: 99 mg/dL (ref 65–99)
Glucose-Capillary: 105 mg/dL — ABNORMAL HIGH (ref 65–99)
Glucose-Capillary: 82 mg/dL (ref 65–99)

## 2017-01-12 LAB — BASIC METABOLIC PANEL
ANION GAP: 8 (ref 5–15)
BUN: 11 mg/dL (ref 6–20)
CALCIUM: 9 mg/dL (ref 8.9–10.3)
CO2: 23 mmol/L (ref 22–32)
Chloride: 106 mmol/L (ref 101–111)
Creatinine, Ser: 0.54 mg/dL (ref 0.44–1.00)
GFR calc Af Amer: >60 mL/min (ref >60)
GFR calc non Af Amer: >60 mL/min (ref >60)
GLUCOSE: 123 mg/dL — AB (ref 65–99)
Potassium: 3.9 mmol/L (ref 3.5–5.1)
Sodium: 137 mmol/L (ref 135–145)

## 2017-01-12 LAB — CBC
HCT: 32.1 % — ABNORMAL LOW (ref 36.0–46.0)
HEMOGLOBIN: 10.2 g/dL — AB (ref 12.0–15.0)
MCH: 25.8 pg — AB (ref 26.0–34.0)
MCHC: 31.8 g/dL (ref 30.0–36.0)
MCV: 81.3 fL (ref 78.0–100.0)
Platelets: 178 10*3/uL (ref 150–400)
RBC: 3.95 MIL/uL (ref 3.87–5.11)
RDW: 16.4 % — ABNORMAL HIGH (ref 11.5–15.5)
WBC: 7.5 10*3/uL (ref 4.0–10.5)

## 2017-01-12 NOTE — Progress Notes (Signed)
[place] PHYSICAL MEDICINE & REHABILITATION     PROGRESS NOTE  Subjective/Complaints:  Pt seen laying in bed this AM.  She slept well overnight.  She notes a adjustment to her bowel schedule due to incontinence during therapies.   ROS: Denies CP, SOB, N/V/D.  Objective: Vital Signs: Blood pressure (!) 123/57, pulse 89, temperature 98.8 F (37.1 C), temperature source Oral, resp. rate 18, height 5' (1.524 m), weight 119 kg (262 lb 6.4 oz), SpO2 98 %. No results found.  Recent Labs  01/11/17 0652 01/12/17 0507  WBC 7.3 7.5  HGB 10.4* 10.2*  HCT 33.4* 32.1*  PLT 157 178    Recent Labs  01/11/17 0652 01/12/17 0507  NA 139 137  K 3.8 3.9  CL 105 106  GLUCOSE 130* 123*  BUN 11 11  CREATININE 0.57 0.54  CALCIUM 8.9 9.0   CBG (last 3)   Recent Labs  01/11/17 1645 01/11/17 2120 01/12/17 0644  GLUCAP 86 90 127*    Wt Readings from Last 3 Encounters:  12/29/16 119 kg (262 lb 6.4 oz)  12/25/16 131.7 kg (290 lb 6.4 oz)  12/17/16 127.9 kg (282 lb)    Physical Exam:  BP (!) 123/57 (BP Location: Right Arm)   Pulse 89   Temp 98.8 F (37.1 C) (Oral)   Resp 18   Ht 5' (1.524 m)   Wt 119 kg (262 lb 6.4 oz)   SpO2 98%   BMI 51.25 kg/m  Constitutional: She appears well-developed. No distress. Obese.  HENT: Normocephalicand atraumatic.  Eyes: EOMI. No discharge.  Cardiovascular: RRR. No JVD. Respiratory: Effort normal breath sounds normal.  GI: Soft. Bowel sounds are normal.   Musculoskeletal: She exhibits no edemaor tenderness.  Neurological: She is alertand oriented.  Speech clear.  Follows commands without difficulty.  Motor: B/l UE motor 5/5 prox to distal.  B/l LE: 0/5 HF, KE and ADF/PF (unchanged).  Sensation diminished to LT below inguinal area (gradually improving).  Skin: Skin is warmand dry. She is not diaphoretic.  Psychiatric: She has a normal mood and affect. Her speech is normaland behavior is normal. Judgmentand thought contentnormal.  Cognition and memoryare normal.    Assessment/Plan: 1. 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 Physiatrist is providing close team supervision and 24 hour management of active medical problems listed below. Physiatrist and rehab team continue to assess barriers to discharge/monitor patient progress toward functional and medical goals.  Function:  Bathing Bathing position   Position: Bed  Bathing parts Body parts bathed by patient: Right arm, Left arm, Chest, Abdomen, Right upper leg, Left upper leg Body parts bathed by helper: Front perineal area, Buttocks, Back  Bathing assist Assist Level: Touching or steadying assistance(Pt > 75%)      Upper Body Dressing/Undressing Upper body dressing   What is the patient wearing?: Pull over shirt/dress     Pull over shirt/dress - Perfomed by patient: Thread/unthread left sleeve, Thread/unthread right sleeve, Put head through opening, Pull shirt over trunk          Upper body assist Assist Level: Set up   Set up : To obtain clothing/put away  Lower Body Dressing/Undressing Lower body dressing   What is the patient wearing?: Pants, Non-skid slipper socks, Ted Hose     Pants- Performed by patient: Thread/unthread right pants leg, Thread/unthread left pants leg, Pull pants up/down   Non-skid slipper socks- Performed by patient:  Don/doff right sock, Don/doff left sock Non-skid slipper socks- Performed by helper: Don/doff left sock, Don/doff right sock             TED Hose - Performed by patient: Don/doff right TED hose, Don/doff left TED hose TED Hose - Performed by helper: Don/doff right TED hose, Don/doff left TED hose  Lower body assist Assist for lower body dressing: Supervision or verbal cues      Toileting Toileting Toileting activity did not occur: No continent bowel/bladder event   Toileting steps  completed by helper: Adjust clothing prior to toileting, Performs perineal hygiene, Adjust clothing after toileting Toileting Assistive Devices: Toilet aid  Toileting assist Assist level: Set up/obtain supplies   Transfers Chair/bed transfer   Chair/bed transfer method: Lateral scoot Chair/bed transfer assist level: Touching or steadying assistance (Pt > 75%) Chair/bed transfer assistive device: Sliding board Mechanical lift: Maximove   Locomotion Ambulation Ambulation activity did not occur: Safety/medical concerns         Wheelchair   Type: Manual Max wheelchair distance: 200 ft Assist Level: Supervision or verbal cues  Cognition Comprehension Comprehension assist level: Follows complex conversation/direction with no assist  Expression Expression assist level: Expresses complex ideas: With no assist  Social Interaction Social Interaction assist level: Interacts appropriately with others - No medications needed.  Problem Solving Problem solving assist level: Solves complex problems: Recognizes & self-corrects  Memory Memory assist level: Complete Independence: No helper    Medical Problem List and Plan: 1.  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  DVT: LLE DVT - Xarelto started 3. Pain Management:   Paraesthesias controlled on Neurontin tid and Mobic daily, does not want to increase meds at present.   Tramadol PRN 4. Mood: LCSW to follow for evaluation and support.  5. Neuropsych: This patient is capable of making decisions on her own behalf. 6. Skin/Wound Care: Educate on importance of boosting and pressure relief measures.  7. Fluids/Electrolytes/Nutrition: Monitor I/O.  8. T2DM: new diagnosis with Hgb A1C-6.9 at admission.   Monitor BS ac/hs.    Educate patient on CM diet.   SSI for now  Controlled 5/29  Cont to monitor 9. Neurogenic bowel:    Adjusting meds to limit  incontinence 10. Morbid obesity: Educate on appropriate diet. Will require bariatric equipment as well as skin monitoring to prevent breakdown.   11. Neurogenic bladder: foley d/ced with voiding trial  Low dose urecholine started, improved 12. Acute lower UTI  Ucx with Proteus and E.Coli  Resistant to macrobid, Keflex completed 5/19-5/26 13. Hyponatremia: Resolved  Cont to monitor 14. Transaminitis: Resolved  Cont to monitor 15. ABLA  Hb 10.2 on 5/29  Cont to monitor 16. Hemorrhoids  Anusol ordered 5/24  LOS (Days) 14 A FACE TO FACE EVALUATION WAS PERFORMED  Ankit Lorie Phenix 01/12/2017 9:22 AM

## 2017-01-12 NOTE — Progress Notes (Signed)
Occupational Therapy Session Note  Patient Details  Name: Traci [name] MRN: [id number] Date of Birth: May 06, 1976  Today's Date: 01/12/2017 OT Individual Time: 8841-6606 OT Individual Time Calculation (min): 60 min    Short Term Goals: Week 1:  OT Short Term Goal 1 (Week 1): Pt will complete LB bathing sit to supine with mod assist.  OT Short Term Goal 1 - Progress (Week 1): Met OT Short Term Goal 2 (Week 1): Pt will donn pullup shorts/pants wiith mod assist in supine position rolling. OT Short Term Goal 2 - Progress (Week 1): Met OT Short Term Goal 3 (Week 1): Pt will complete sliding board transfer to the drop arm commode with max assist.   OT Short Term Goal 3 - Progress (Week 1): Met OT Short Term Goal 4 (Week 1): Pt complete toilet hygiene and clothing with max assist using lateral leans in sitting.  OT Short Term Goal 4 - Progress (Week 1): Not met Week 2:  OT Short Term Goal 1 (Week 2): Pt will complete toilet hygiene with max assist in sitting with lateral leans side to side OT Short Term Goal 2 (Week 2): Pt will perform sliding board transfers with min assist to the drop arm commode.    OT Short Term Goal 3 (Week 2): Pt will complete LB dressing in long sitting/circle sit to supine with min assist. OT Short Term Goal 4 (Week 2): Pt will complete LB bathing supine rolling side to side with min assist.   Skilled Therapeutic Interventions/Progress Updates:    1:1 Self care retraining at bed level with focus on bed mobility from a flat bed with upper bed rails. Pt bathed and dressed upper body sitting EOB. Performed slide board transfer EOB to [facility] to attempt to void bowels with no success. Pt transferred back to bed with steadying A and returned to supine with A for bilateral LEs into the bed. Pt able to don pants, TEDS and socks with setup. Pt transferred into w/c with steadying A . Pt unable to properly place the board and does required A to setup slide board. Pt performed all grooming  tasks at sink mod I. Pt continues to require A to pull leg rests on w/c. Pt highly motivated and continues to work on her d/c plan. Left in w/c with call bell in place.   Therapy Documentation Precautions:  Precautions Precautions: Fall Precaution Comments: BLE paraparesis Restrictions Weight Bearing Restrictions: No Pain:    See Function Navigator for Current Functional Status.   Therapy/Group: Individual Therapy  [name] 01/12/2017, 3:25 PM

## 2017-01-12 NOTE — Progress Notes (Signed)
Recreational Therapy Session Note  Patient Details  Name: Traci [name] MRN: [id number] Date of Birth: 04-01-1976 Today's Date: 01/12/2017  Pain: no c/o Skilled Therapeutic Interventions/Progress Updates: session focused on discharge planning as pt states she really wants to discharge back to her apartment with her roommate.  Pt states she is confident that she can do it and is not worried about it.  Pt stated that ya'll (referring to therapists) are more concerned about it than she is.  She is hoping to work with her room mate and her family to coordinate care.  [place] 01/12/2017, 3:09 PM

## 2017-01-12 NOTE — Progress Notes (Signed)
Physical Therapy Session Note  Patient Details  Name: Traci [name] MRN: [id number] Date of Birth: [birth year]  Today's Date: 01/12/2017 PT Individual Time: 1102-1203 PT Individual Time Calculation (min): 61 min   Short Term Goals: Week 2:  PT Short Term Goal 1 (Week 2): Pt to perform level sliding board transfers with supervision.   PT Short Term Goal 2 (Week 2): Pt to perform car transfer with min assist.  PT Short Term Goal 3 (Week 2): Pt to perform sitting>supine with supervision.  PT Short Term Goal 4 (Week 2): Pt to demonstrate pressure relief techniques and verbalize frequency.   Skilled Therapeutic Interventions/Progress Updates:    Session 1: Pt up in w/c upon arrival, agreeable to PT session. Propelling w/c 250 ft with supervision including over carpeting. Pt able to manage removing legrests. Discussed transition to ultralite rigid frame w/c (none available in her size at this time). Contacted Josh at [facility] to schedule w/c evaluation. Transfers: w/c>mat table with approx. 1 inch incline. Pt having significant difficulty with height differential, requiring increased assistance and time to complete. Pt noted to be incontinent of urine during session. Assisted back to w/c and then w/c>bed. Bed mobility: supervision supine<>sitting. Pt requesting that nursing assist with changing after session. Pt in bed with all needs in reach.  Session 2: Pt in bed upon arrival. Pt states that she does not have any more clothes. Assisted with donning paper pants in bed with min assist. Bed Mobility: rolling with rails supervision, without rails, min assist with LE management. Transfers: bed>w/c with min assist and sliding board, w/c<>mat table X3 with min assist. Working on hand placement, head/hip relationship, board placement. Sitting on mat table, working on lateral scooting with head/hip relationship and timing. Pt propelling w/c up approx. 30 ft ramp. Following session pt returned to room with all  needs in reach.   Therapy Documentation Precautions:  Precautions Precautions: Fall Precaution Comments: BLE paraparesis Restrictions Weight Bearing Restrictions: No  Pain: Denies pain  See Function Navigator for Current Functional Status.   Therapy/Group: Individual Therapy  Linard Millers, PT 01/12/2017, 12:24 PM

## 2017-01-13 ENCOUNTER — Inpatient Hospital Stay (HOSPITAL_COMMUNITY): Payer: PRIVATE HEALTH INSURANCE | Admitting: Occupational Therapy

## 2017-01-13 ENCOUNTER — Inpatient Hospital Stay (HOSPITAL_COMMUNITY): Payer: PRIVATE HEALTH INSURANCE | Admitting: Physical Therapy

## 2017-01-13 LAB — GLUCOSE, CAPILLARY
GLUCOSE-CAPILLARY: 93 mg/dL (ref 65–99)
GLUCOSE-CAPILLARY: 106 mg/dL — AB (ref 65–99)
Glucose-Capillary: 92 mg/dL (ref 65–99)
Glucose-Capillary: 84 mg/dL (ref 65–99)

## 2017-01-13 MED ORDER — SENNA 8.6 MG PO TABS
2.0000 | ORAL_TABLET | Freq: Every day | ORAL | Status: DC
  Administered 2017-01-13 – 2017-01-17 (×5): 17.2 mg via ORAL
  Filled 2017-01-13 (×5): qty 2

## 2017-01-13 NOTE — Progress Notes (Signed)
[facility] PHYSICAL MEDICINE & REHABILITATION     PROGRESS NOTE  Subjective/Complaints:  Pt seen sitting up in her chair this AM.  She notes issues with bowel/bladder overnight.   ROS: Denies CP, SOB, N/V/D.  Objective: Vital Signs: Blood pressure (!) 125/59, pulse 86, temperature 98.5 F (36.9 C), temperature source Oral, resp. rate 18, height 5' (1.524 m), weight 119 kg (262 lb 6.4 oz), SpO2 98 %. No results found.  Recent Labs  01/11/17 0652 01/12/17 0507  WBC 7.3 7.5  HGB 10.4* 10.2*  HCT 33.4* 32.1*  PLT 157 178    Recent Labs  01/11/17 0652 01/12/17 0507  NA 139 137  K 3.8 3.9  CL 105 106  GLUCOSE 130* 123*  BUN 11 11  CREATININE 0.57 0.54  CALCIUM 8.9 9.0   CBG (last 3)   Recent Labs  01/12/17 1642 01/12/17 2058 01/13/17 0636  GLUCAP 82 99 92    Wt Readings from Last 3 Encounters:  12/29/16 119 kg (262 lb 6.4 oz)  12/25/16 131.7 kg (290 lb 6.4 oz)  12/17/16 127.9 kg (282 lb)    Physical Exam:  BP (!) 125/59 (BP Location: Right Wrist)   Pulse 86   Temp 98.5 F (36.9 C) (Oral)   Resp 18   Ht 5' (1.524 m)   Wt 119 kg (262 lb 6.4 oz)   SpO2 98%   BMI 51.25 kg/m  Constitutional: She appears well-developed. No distress. Obese.  HENT: Normocephalicand atraumatic.  Eyes: EOMI. No discharge.  Cardiovascular: RRR. No JVD. Respiratory: Effort normal breath sounds normal.  GI: Soft. Bowel sounds are normal.   Musculoskeletal: She exhibits no edemaor tenderness.  Neurological: She is alertand oriented.  Speech clear.  Follows commands without difficulty.  Motor: B/l UE motor 5/5 prox to distal.  B/l LE: 0/5 HF, KE and ADF/PF (stable).  Sensation diminished to LT below inguinal area (gradually improving).  Skin: Skin is warmand dry. She is not diaphoretic.  Psychiatric: She has a normal mood and affect. Her speech is normaland behavior is normal. Judgmentand thought contentnormal. Cognition and memoryare normal.    Assessment/Plan: 1.  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 Physiatrist is providing close team supervision and 24 hour management of active medical problems listed below. Physiatrist and rehab team continue to assess barriers to discharge/monitor patient progress toward functional and medical goals.  Function:  Bathing Bathing position   Position: Bed  Bathing parts Body parts bathed by patient: Right arm, Left arm, Chest, Abdomen, Right upper leg, Left upper leg Body parts bathed by helper: Front perineal area, Buttocks, Back  Bathing assist Assist Level: Touching or steadying assistance(Pt > 75%)      Upper Body Dressing/Undressing Upper body dressing   What is the patient wearing?: Pull over shirt/dress     Pull over shirt/dress - Perfomed by patient: Thread/unthread left sleeve, Thread/unthread right sleeve, Put head through opening, Pull shirt over trunk          Upper body assist Assist Level: Set up   Set up : To obtain clothing/put away  Lower Body Dressing/Undressing Lower body dressing   What is the patient wearing?: Pants, Non-skid slipper socks, Ted Hose     Pants- Performed by patient: Thread/unthread right pants leg, Thread/unthread left pants leg, Pull pants up/down   Non-skid slipper socks- Performed by patient: Don/doff right sock, Don/doff left sock Non-skid slipper socks- Performed  by helper: Don/doff left sock, Don/doff right sock             TED Hose - Performed by patient: Don/doff right TED hose, Don/doff left TED hose TED Hose - Performed by helper: Don/doff right TED hose, Don/doff left TED hose  Lower body assist Assist for lower body dressing: Supervision or verbal cues      Toileting Toileting Toileting activity did not occur: No continent bowel/bladder event   Toileting steps completed by helper: Adjust clothing prior to toileting, Performs  perineal hygiene, Adjust clothing after toileting Toileting Assistive Devices: Toilet aid  Toileting assist Assist level: Set up/obtain supplies   Transfers Chair/bed transfer   Chair/bed transfer method: Lateral scoot Chair/bed transfer assist level: Touching or steadying assistance (Pt > 75%) Chair/bed transfer assistive device: Sliding board, Armrests Mechanical lift: Maximove   Locomotion Ambulation Ambulation activity did not occur: Safety/medical concerns         Wheelchair   Type: Manual Max wheelchair distance: 200 Assist Level: Supervision or verbal cues  Cognition Comprehension Comprehension assist level: Follows complex conversation/direction with no assist  Expression Expression assist level: Expresses complex ideas: With no assist  Social Interaction Social Interaction assist level: Interacts appropriately with others - No medications needed.  Problem Solving Problem solving assist level: Solves complex problems: Recognizes & self-corrects  Memory Memory assist level: Complete Independence: No helper    Medical Problem List and Plan: 1.  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  DVT: LLE DVT - Xarelto started 3. Pain Management:   Paraesthesias controlled on Neurontin tid and Mobic daily, does not want to increase meds at present.   Tramadol PRN 4. Mood: LCSW to follow for evaluation and support.  5. Neuropsych: This patient is capable of making decisions on her own behalf. 6. Skin/Wound Care: Educate on importance of boosting and pressure relief measures.  7. Fluids/Electrolytes/Nutrition: Monitor I/O.  8. T2DM: new diagnosis with Hgb A1C-6.9 at admission.   Monitor BS ac/hs.    Educate patient on CM diet.   SSI for now  Controlled 5/30  Cont to monitor 9. Neurogenic bowel:    Adjusting meds to limit incontinence 10. Morbid obesity: Educate on appropriate diet. Will  require bariatric equipment as well as skin monitoring to prevent breakdown.   11. Neurogenic bladder: foley d/ced with voiding trial  Low dose urecholine started, improved 12. Acute lower UTI  Ucx with Proteus and E.Coli  Resistant to macrobid, Keflex completed 5/19-5/26 13. Hyponatremia: Resolved  Cont to monitor 14. Transaminitis: Resolved  Cont to monitor 15. ABLA  Hb 10.2 on 5/29  Cont to monitor 16. Hemorrhoids  Anusol ordered 5/24  LOS (Days) 15 A FACE TO FACE EVALUATION WAS PERFORMED  Ankit Lorie Phenix 01/13/2017 9:23 AM

## 2017-01-13 NOTE — Plan of Care (Signed)
Problem: SCI BOWEL ELIMINATION Goal: RH STG MANAGE BOWEL WITH ASSISTANCE STG Manage Bowel with Mod Assistance.   Outcome: Not Progressing Pt max assist Goal: RH STG SCI MANAGE BOWEL WITH MEDICATION WITH ASSISTANCE STG SCI Manage bowel with medication with min assistance.   Outcome: Not Progressing Pt is max assist.  Changed goal to mod assist

## 2017-01-13 NOTE — Progress Notes (Signed)
Physical Therapy Session Note  Patient Details  Name: Traci [name] MRN: [id number] Date of Birth: 11/13/1975  Today's Date: 01/13/2017 PT Individual Time: 0830-0930 PT Individual Time Calculation (min): 60 min   Short Term Goals: Week 2:  PT Short Term Goal 1 (Week 2): Pt to perform level sliding board transfers with supervision.   PT Short Term Goal 2 (Week 2): Pt to perform car transfer with min assist.  PT Short Term Goal 3 (Week 2): Pt to perform sitting>supine with supervision.  PT Short Term Goal 4 (Week 2): Pt to demonstrate pressure relief techniques and verbalize frequency.   Skilled Therapeutic Interventions/Progress Updates: Pt presented in w/c to agreeable to therapy. Pt propelled to rehab gym, initiated SB transfer to mat level surface. Pt noted to have incontinence. Transported pt back to room, SB transfer level surface minA to R, cues for head hips relationship. MaxA for peri-hygiene and LB clothing management due to time. Pt able to perform bed mobility supervision rolling L/R. SB transfer to L to return to w/c with PTA blocking LLE and providing cues for increased R lean to facilitate transfer. Transported pt to rehab gym and use of UE bike random L4 x 5 min, pt able to perform with no break. Pt returned to room and remained in w/c with current needs met.      Therapy Documentation Precautions:  Precautions Precautions: Fall Precaution Comments: BLE paraparesis Restrictions Weight Bearing Restrictions: No General:   Vital Signs:  Pain: Pain Assessment Pain Assessment: 0-10 Pain Score: 6  Pain Type: Acute pain Pain Location: Back Pain Orientation: Lower Pain Descriptors / Indicators: Aching Pain Frequency: Constant Pain Onset: On-going Pain Intervention(s): Medication (See eMAR)  See Function Navigator for Current Functional Status.   Therapy/Group: Individual Therapy  Traci [name]  Traci [name], PTA  01/13/2017, 9:56 AM

## 2017-01-13 NOTE — Patient Care Conference (Signed)
Inpatient RehabilitationTeam Conference and Plan of Care Update Date: 01/13/2017   Time: 11:45 AM    Patient Name: Traci [name]      Medical Record Number: [id number]  Date of Birth: Jan 17, 1976 Sex: Female         Room/Bed: 4M08C/4M08C-01 Payor Info: Payor: [employer] / Plan: [employer]ST / Product Type: *No Product type* /    Admitting Diagnosis: transverse myelilis  Admit Date/Time:  12/29/2016  5:26 PM Admission Comments: No comment available   Primary Diagnosis:  Thoracic myelopathy Principal Problem: Thoracic myelopathy  Patient Active Problem List   Diagnosis Date Noted  . Hemorrhoids   . Neuropathic pain   . Acute blood loss anemia   . Transaminitis   . Acute lower UTI   . Hyponatremia   . Peroneal DVT (deep venous thrombosis), left ([place])   . Thoracic myelopathy   . Neurogenic bladder   . Neurogenic bowel   . Bilateral leg weakness   . CNS demyelination ([place])   . Myelitis ([place])   . Paresthesia of both lower extremities   . Morbid obesity ([place])   . Spinal stenosis of lumbar region   . DDD (degenerative disc disease), lumbosacral   . Diabetes mellitus type 2 in obese ([place])   . Paraplegia ([place])   . Transverse myelitis ([place]) 12/25/2016  . Steroid-induced hyperglycemia 12/25/2016    Expected Discharge Date: Expected Discharge Date: 01/20/17  Team Members Present: Physician leading conference: Dr. 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 Pt on toileting schedule q 3/4 hours with PVR less than 350, incontinent at times   Min to mod assist with bowel and bladder program  Set hours for timed voids and reinforce up to [facility]   Swallow/Nutrition/ Hydration             ADL's   supervision from flat bed with bed rails for bathing and dressing, steadying A slide board transfers, continues to be incontient   min A bathing, LB dressing, toileting, toilet transfers; mod A to shower; mod I UB self care and meal prep from w/c level  ADL tranining, toileting schedule, setup for transfers, dynamic sitting balance and d/c planning   Mobility   min assist with level slide board transfers. mod assist with slight incline. Propelling w/c community distances with supervision and breaks. Continued incontinence bowel and/or bladder with activity.   Mod I transfers, bed mobility, w/c skills.   Progress transfers without LE support and uneven surfaces, scheduling w/c eval, work on UE/trunk strength, sitting balance with reaching.    Communication             Safety/Cognition/ Behavioral Observations  pt is a+o x 4 and calls for assistance.   pt continue to call for assistance   Pt participate in care and make decisions regarding d/c planning   Pain   Pt reports pain 6/10 pain to left shoulder and lower back, PRN Robaxin and Tramadol administered administered with relief  less than 4  Continue plan of care   Skin   No breakdown   Continue to monitor  skin daily   Have patient monitor skin with min assistance      *See Care Plan and progress notes for long and short-term goals.  Barriers to Discharge: Neurogenic bladder/bowel, obesity, mobility, transfers, DVT    Possible Resolutions to Barriers:  Therapies, optimize bladder/bowel meds, anticoag    Discharge Planning/Teaching Needs:  Pt undecided where to go at discharge either place everyone is working and will not have 24 hr care. Needs to be on a bowel program-daily and see if can self cath      Team Discussion:  Progressing in therapies toward her goals of min assist level.  Bowel program and bladder program on-going. Bladder meds started see if assists in her emptying. DM controlled and Treating DVT. Concern over who will provide care to her and discharge disposition. Team feels if extended stay would not change her goals.   Revisions to Treatment Plan:  DC 6/6   Continued Need for Acute Rehabilitation Level of Care: The patient requires daily medical management by a physician with specialized training in physical medicine and rehabilitation for the following conditions: Daily direction of a multidisciplinary physical rehabilitation program to ensure safe treatment while eliciting the highest outcome that is of practical value to the patient.: Yes Daily medical management of patient stability for increased activity during participation in an intensive rehabilitation regime.: Yes Daily analysis of laboratory values and/or radiology reports with any subsequent need for medication adjustment of medical intervention for : Neurological problems;Diabetes problems;Other;Urological problems  Traci [name] 01/14/2017, 9:46 AM

## 2017-01-13 NOTE — Progress Notes (Signed)
Occupational Therapy Session Note  Patient Details  Name: Traci [name] MRN: [id number] Date of Birth: [birth year]  Today's Date: 01/13/2017 OT Individual Time: 1100-1200 OT Individual Time Calculation (min): 60 min    Short Term Goals: Week 2:  OT Short Term Goal 1 (Week 2): Pt will complete toilet hygiene with max assist in sitting with lateral leans side to side OT Short Term Goal 2 (Week 2): Pt will perform sliding board transfers with min assist to the drop arm commode.    OT Short Term Goal 3 (Week 2): Pt will complete LB dressing in long sitting/circle sit to supine with min assist. OT Short Term Goal 4 (Week 2): Pt will complete LB bathing supine rolling side to side with min assist.       Skilled Therapeutic Interventions/Progress Updates:    Pt seen this session to work on transfers and UB/ core strengthening.  Pt received in w/c and stated she wanted to wait for the last OT  session to do self care.  Pt self propelled to gym and transferred to mat with board and foot stool under her with close supervision/ set. On mat, she worked on torso exercises with hip elevation side to side, forward leaning to 40 degrees with hands on ball, overhead reaching with B hands on ball with small lean side to side.   UE strengthening with 8 lb wt for overhead triceps,  Push up blocks. Pt tolerated exercises well and then transferred back to w/c with board. Pt returned to room with all needs met.   Therapy Documentation Precautions:  Precautions Precautions: Fall Precaution Comments: BLE paraparesis Restrictions Weight Bearing Restrictions: No  Pain: Pain Assessment Pain Assessment: 0-10 Pain Score: 6  Pain Type: Acute pain Pain Location: Back Pain Orientation: Lower Pain Descriptors / Indicators: Aching Pain Frequency: Constant Pain Onset: On-going Pain Intervention(s): Medication (See eMAR) ADL:  See Function Navigator for Current Functional Status.   Therapy/Group: Individual  Therapy  [place] 01/13/2017, 10:53 AM

## 2017-01-13 NOTE — Progress Notes (Signed)
Physical Therapy Session Note  Patient Details  Name: Traci [name] MRN: [id number] Date of Birth: 11-May-1976  Today's Date: 01/13/2017 PT Individual Time: 0801-0831 PT Individual Time Calculation (min): 30 min   Short Term Goals: Week 2:  PT Short Term Goal 1 (Week 2): Pt to perform level sliding board transfers with supervision.   PT Short Term Goal 2 (Week 2): Pt to perform car transfer with min assist.  PT Short Term Goal 3 (Week 2): Pt to perform sitting>supine with supervision.  PT Short Term Goal 4 (Week 2): Pt to demonstrate pressure relief techniques and verbalize frequency.    Skilled Therapeutic Interventions/Progress Updates:   Pt received supine in bed and agreeable to PT.   Bed mobility including supine to long sitting with supervision assist from PT. Dressing with supervision assist to manage ted hose and socks. PT provided min assist to manage pants using roling technique to L and right to pull pants to waist. Supervision assist from PT to roll with heavy use of bed rails.  Supine>sit transfer with supervision assist and min cues from PT for LE management and positioning to improve safety and reduce energy expenditure.   Lateral scoot, slide board Transfer with min assist from PT with BLE supported on 6 inch step. Min cues for LE positioning and improved head/hips relationship.  WC mobility x 224f  supervision assist from PT for safety and WC parts management.   [facility] and all needs met.    Therapy Documentation Precautions:  Precautions Precautions: Fall Precaution Comments: BLE paraparesis Restrictions Weight Bearing Restrictions: No   Pain: 0/10   See Function Navigator for Current Functional Status.   Therapy/Group: Individual Therapy  ALorie Phenix5/30/2018, 8:36 AM

## 2017-01-13 NOTE — Progress Notes (Signed)
Occupational Therapy Weekly Progress Note  Patient Details  Name: Coy Vandoren MRN: [id number] Date of Birth: 10/04/1975  Beginning of progress report period: Jan 06, 2017 End of progress report period: Jan 13, 2017  Today's Date: 01/13/2017 OT Individual Time: 1300-1400 OT Individual Time Calculation (min): 60 min    Patient has met 4 of 4 short term goals.  She continues to make steady progress with OT.  Currently, she completes UB selfcare with supervision sitting unsupported.  She is able to complete LB selfcare in bed from long sitting position with min assist.  Transfers completed with use of the sliding board with min assist to the drop arm commode.  She continues to need max assist for managing clothing with min assist for toilet hygiene.  Currently, she is integrating use of leg loops to help move her LEs, as at this time she still demonstrates no active movement in her LEs. Feel she is on target to meet overall min assist to supervision level goals.  Will update goals based on progress, however biggest barrier at this time is her need for min assist during transfers and not having 24 hour supervision at discharge.     Patient continues to demonstrate the following deficits: muscle weakness and muscle paralysis, unbalanced muscle activation and decreased balance strategies and therefore will continue to benefit from skilled OT intervention to enhance overall performance with BADL and Reduce care partner burden.  Patient progressing toward long term goals..  Continue plan of care.  OT Short Term Goals Week 3:  OT Short Term Goal 1 (Week 3): Continue working on [employer].   Skilled Therapeutic Interventions/Progress Updates:    Pt completed transfer from wheelchair to drop arm commode with min assist after placement of sliding board.  She was able to use her leg loops for removing them off of the wheelchair leg rests as well as actual removal of the leg rests with supervision and  increased time.  She needed mod assist for clothing management with max assist to remove soiled brief with lateral leans.  Toilet hygiene was completed with setup but she required max assist for donning new pants.  Once she finished toileting she was able to transfer back to the wheelchair with min assist.  Pt left in chair at bedside with call button and phone in reach.    Therapy Documentation Precautions:  Precautions Precautions: Fall Precaution Comments: BLE paraparesis Restrictions Weight Bearing Restrictions: No  Pain: Pain Assessment Pain Assessment: No/denies pain ADL: See Function Navigator for Current Functional Status.   Therapy/Group: Individual Therapy  Yousif Edelson OTR/L 01/13/2017, 4:56 PM

## 2017-01-14 ENCOUNTER — Inpatient Hospital Stay (HOSPITAL_COMMUNITY): Payer: PRIVATE HEALTH INSURANCE | Admitting: Occupational Therapy

## 2017-01-14 ENCOUNTER — Inpatient Hospital Stay (HOSPITAL_COMMUNITY): Payer: PRIVATE HEALTH INSURANCE | Admitting: Physical Therapy

## 2017-01-14 LAB — GLUCOSE, CAPILLARY
GLUCOSE-CAPILLARY: 103 mg/dL — AB (ref 65–99)
GLUCOSE-CAPILLARY: 92 mg/dL (ref 65–99)
GLUCOSE-CAPILLARY: 160 mg/dL — AB (ref 65–99)
Glucose-Capillary: 111 mg/dL — ABNORMAL HIGH (ref 65–99)

## 2017-01-14 NOTE — Progress Notes (Signed)
Occupational Therapy Session Note  Patient Details  Name: Traci [name] MRN: [id number] Date of Birth: [birth year]  Today's Date: 01/14/2017 OT Individual Time: 0800-0900 OT Individual Time Calculation (min): 60 min    Short Term Goals: Week 1:  OT Short Term Goal 1 (Week 1): Pt will complete LB bathing sit to supine with mod assist.  OT Short Term Goal 1 - Progress (Week 1): Met OT Short Term Goal 2 (Week 1): Pt will donn pullup shorts/pants wiith mod assist in supine position rolling. OT Short Term Goal 2 - Progress (Week 1): Met OT Short Term Goal 3 (Week 1): Pt will complete sliding board transfer to the drop arm commode with max assist.   OT Short Term Goal 3 - Progress (Week 1): Met OT Short Term Goal 4 (Week 1): Pt complete toilet hygiene and clothing with max assist using lateral leans in sitting.  OT Short Term Goal 4 - Progress (Week 1): Not met  Skilled Therapeutic Interventions/Progress Updates:    Pt completed bathing and dressing bed level during session.  Supervision for washing and donning pullover shirt in circle sitting.  She was able to bring her LEs up to her for washing her legs and feet as well as for applying lotion.  Supervision for donning TEDs, pants, and gripper socks over feet as well in the same position.  Transitioned back to supine for washing front peri area and buttocks with min guard.  Therapist applied new brief and provided min assist for pulling pants over hips.  Pt completed session in bed with call button and phone in reach.    Therapy Documentation Precautions:  Precautions Precautions: Fall Precaution Comments: BLE paraparesis Restrictions Weight Bearing Restrictions: No  Pain: Pain Assessment Pain Assessment: Faces Pain Score: 6  Faces Pain Scale: Hurts a little bit Pain Type: Acute pain Pain Location: Back Pain Orientation: Lower Pain Descriptors / Indicators: Burning;Tingling Pain Frequency: Constant Pain Onset: On-going Patients  Stated Pain Goal: 4 Pain Intervention(s): Repositioned;Medication (See eMAR) ADL: See Function Navigator for Current Functional Status.   Therapy/Group: Individual Therapy  Fareed Fung OTR/L 01/14/2017, 12:15 PM

## 2017-01-14 NOTE — Progress Notes (Signed)
Physical Therapy Weekly Progress Note  Patient Details  Name: Traci [name] MRN: [id number] Date of Birth: 1975/09/21  Beginning of progress report period: Jan 07, 2017 End of progress report period: Jan 14, 2017  Today's Date: 01/14/2017 PT Individual [id number], [id number] PT Individual Time Calculation (min): 45 min, 80 min   Patient has met 2 of 4 short term goals.  Pt continues to make gradual gains with mobility. Transfers are improving with on even surfaces but continues to struggle with uneven transfers. Bed mobility has improved to supervision level. Discharge planning remains unclear, home with roommate or home with family. Pt states she hoping to D/C home with her roommate to assist. No family has been present for pt/family education. W/C evaluation was completed with [facility] on 01/14/17. Will continue to attempt to maximize mobility with transfers, bed mobility, and w/c mobility.   Patient continues to demonstrate the following deficits muscle weakness and muscle paralysis, and decreased postural control and therefore will continue to benefit from skilled PT intervention to increase functional independence with mobility.  Patient progressing toward long term goals..  Continue plan of care.  Session 1: Pt in bed upon arrival, agreeable to PT session. Working bed mobility with rolling and coming to sitting EOB. Bed>w/c slide board transfer with min assist. Providing cue for technique. Propelling w/c 200 ft with supervision. Working on [employer] in w/c for transfer and w/c parts management. Dorsiflexion stretch performed along with general UE strengthening exercises. Working on seated pressure relief techniques including sideleaning and lift.  Pt returned to room after session. Up in w/c with all needs in reach.  Session 2: Pt sitting in [facility] upon arrival. Reports that she is just finishing with nursing assistance. Starting session 10 min late. Propelling w/c to gym with  supervision. Seen with Josh from [facility] for w/c evaluation and education on w/c management and d/c planning. Discussed option of power w/c which pt declines, feeling that manual chair will be best for her needs at this time. Decided on rigid frame ultralite. Transfers: sit<>stand vis standing frame X10 min with work on trunk control with UE tasks. Transfer performed with sliding board, w/c<>mat table. Assist needed with LEs and encouraging trunk rotation to assist with slide. Transferred from mat>different w/c 20X18 with roho cushion. Pt reports improved comfort and improved ability to propel. Following session, pt returned to room. Up in w/c with all needs in reach.  PT Short Term Goals Week 3:  PT Short Term Goal 1 (Week 3): LTG=STG  Skilled Therapeutic Interventions/Progress Updates:  Cognitive remediation/compensation;Community reintegration;Discharge planning;Functional mobility training;Disease management/prevention;Neuromuscular re-education;Pain management;Patient/family education;Psychosocial support;Therapeutic Activities;Therapeutic Exercise;UE/LE Strength taining/ROM;UE/LE Coordination activities;Wheelchair propulsion/positioning;DME/adaptive equipment instruction;Splinting/orthotics;Stair training;Balance/vestibular training   Therapy Documentation Precautions:  Precautions Precautions: Fall Precaution Comments: BLE paraparesis Restrictions Weight Bearing Restrictions: No General: PT Amount of Missed Time (min): 10 Minutes PT Missed Treatment Reason: Toileting  Pain: Reports mild low back pain. Monitored during session.   See Function Navigator for Current Functional Status.  Therapy/Group: Individual Therapy  Linard Millers, PT 01/14/2017, 2:15 PM

## 2017-01-14 NOTE — Progress Notes (Signed)
[facility] PHYSICAL MEDICINE & REHABILITATION     PROGRESS NOTE  Subjective/Complaints:  Pt seen laying in bed this AM.  She slept well overnight.  Her Senna was increased yesterday and she states that she is starting to feel like she has to go.  ROS: Denies CP, SOB, N/V/D.  Objective: Vital Signs: Blood pressure (!) 115/57, pulse 81, temperature 98.6 F (37 C), temperature source Oral, resp. rate 18, height 5' (1.524 m), weight 119 kg (262 lb 6.4 oz), SpO2 98 %. No results found.  Recent Labs  01/12/17 0507  WBC 7.5  HGB 10.2*  HCT 32.1*  PLT 178    Recent Labs  01/12/17 0507  NA 137  K 3.9  CL 106  GLUCOSE 123*  BUN 11  CREATININE 0.54  CALCIUM 9.0   CBG (last 3)   Recent Labs  01/13/17 1649 01/13/17 2048 01/14/17 0617  GLUCAP 106* 84 103*    Wt Readings from Last 3 Encounters:  12/29/16 119 kg (262 lb 6.4 oz)  12/25/16 131.7 kg (290 lb 6.4 oz)  12/17/16 127.9 kg (282 lb)    Physical Exam:  BP (!) 115/57 (BP Location: Right Wrist)   Pulse 81   Temp 98.6 F (37 C) (Oral)   Resp 18   Ht 5' (1.524 m)   Wt 119 kg (262 lb 6.4 oz)   SpO2 98%   BMI 51.25 kg/m  Constitutional: She appears well-developed. No distress. Obese.  HENT: Normocephalicand atraumatic.  Eyes: EOMI. No discharge.  Cardiovascular: RRR. No JVD. Respiratory: Effort normal breath sounds normal.  GI: Soft. Bowel sounds are normal.   Musculoskeletal: She exhibits no edemaor tenderness.  Neurological: She is alertand oriented.  Speech clear.  Follows commands without difficulty.  Motor: B/l UE motor 5/5 prox to distal.  B/l LE: 0/5 HF, KE and ADF/PF (unchanged).  Sensation diminished to LT below inguinal area (slowly improving).  Skin: Skin is warmand dry. She is not diaphoretic.  Psychiatric: She has a normal mood and affect. Her speech is normaland behavior is normal. Judgmentand thought contentnormal. Cognition and memoryare normal.    Assessment/Plan: 1. Functional  deficits secondary to Transverse Myelitis which require 3+ hours per day of interdisciplinary therapy in a comprehensive inpatient rehab setting. Physiatrist is providing close team supervision and 24 hour management of active medical problems listed below. Physiatrist and rehab team continue to assess barriers to discharge/monitor patient progress toward functional and medical goals.  Function:  Bathing Bathing position   Position: Bed  Bathing parts Body parts bathed by patient: Right arm, Left arm, Chest, Abdomen, Right upper leg, Left upper leg Body parts bathed by helper: Front perineal area, Buttocks, Back  Bathing assist Assist Level: Touching or steadying assistance(Pt > 75%)      Upper Body Dressing/Undressing Upper body dressing   What is the patient wearing?: Pull over shirt/dress     Pull over shirt/dress - Perfomed by patient: Thread/unthread left sleeve, Thread/unthread right sleeve, Put head through opening, Pull shirt over trunk          Upper body assist Assist Level: Set up   Set up : To obtain clothing/put away  Lower Body Dressing/Undressing Lower body dressing   What is the patient wearing?: Pants, Non-skid slipper socks, Ted Hose     Pants- Performed by patient: Thread/unthread right pants leg, Thread/unthread left pants leg, Pull pants up/down   Non-skid slipper socks- Performed by patient: Don/doff right sock, Don/doff left sock Non-skid slipper socks- Performed  by helper: Don/doff left sock, Don/doff right sock             TED Hose - Performed by patient: Don/doff right TED hose, Don/doff left TED hose TED Hose - Performed by helper: Don/doff right TED hose, Don/doff left TED hose  Lower body assist Assist for lower body dressing: Supervision or verbal cues      Toileting Toileting Toileting activity did not occur: No continent bowel/bladder event Toileting steps completed by patient: Performs perineal hygiene Toileting steps completed by  helper: Adjust clothing prior to toileting, Adjust clothing after toileting Toileting Assistive Devices: Toilet aid  Toileting assist Assist level: Set up/obtain supplies, More than reasonable time   Transfers Chair/bed transfer   Chair/bed transfer method: Lateral scoot Chair/bed transfer assist level: Touching or steadying assistance (Pt > 75%) Chair/bed transfer assistive device: Sliding board, Armrests Mechanical lift: Maximove   Locomotion Ambulation Ambulation activity did not occur: Safety/medical concerns         Wheelchair   Type: Manual Max wheelchair distance: 200 Assist Level: Supervision or verbal cues  Cognition Comprehension Comprehension assist level: Follows complex conversation/direction with no assist  Expression Expression assist level: Expresses complex ideas: With no assist  Social Interaction Social Interaction assist level: Interacts appropriately with others - No medications needed.  Problem Solving Problem solving assist level: Solves complex problems: Recognizes & self-corrects  Memory Memory assist level: Complete Independence: No helper    Medical Problem List and Plan: 1.  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  DVT: LLE DVT - Xarelto started 3. Pain Management:   Paraesthesias controlled on Neurontin tid and Mobic daily, does not want to increase meds at present.   Tramadol PRN 4. Mood: LCSW to follow for evaluation and support.  5. Neuropsych: This patient is capable of making decisions on her own behalf. 6. Skin/Wound Care: Educate on importance of boosting and pressure relief measures.  7. Fluids/Electrolytes/Nutrition: Monitor I/O.  8. T2DM: new diagnosis with Hgb A1C-6.9 at admission.   Monitor BS ac/hs.    Educate patient on CM diet.   SSI for now  Controlled 5/31  Cont to monitor 9. Neurogenic bowel:    Adjusting meds   Educated on need to use  bedside commode 10. Morbid obesity: Educate on appropriate diet. Will require bariatric equipment as well as skin monitoring to prevent breakdown.   11. Neurogenic bladder: foley d/ced with voiding trial  Low dose urecholine started, improved 12. Acute lower UTI  Ucx with Proteus and E.Coli  Resistant to macrobid, Keflex completed 5/19-5/26 13. Hyponatremia: Resolved  Cont to monitor 14. Transaminitis: Resolved  Cont to monitor 15. ABLA  Hb 10.2 on 5/29  Cont to monitor 16. Hemorrhoids  Anusol ordered 5/24  Educated on scheduled dosage  LOS (Days) 16 A FACE TO FACE EVALUATION WAS PERFORMED  Shalondra Wunschel Lorie Phenix 01/14/2017 8:13 AM

## 2017-01-14 NOTE — Progress Notes (Signed)
Social Work Patient ID: Traci [name], female   DOB: 12/28/1975, 41 y.o.   MRN: [id number]  Met with pt to discus team conference goals of min assist and discharge still 6/6. Team did not feel extending her would make a difference in her levels Of care. She would still require 24 hr physical care. Her plan is to sty in [place] with her roommate who can provide intermittent assist. She is working hard on her transfers and getting her bowel and bladder regulated. She plans to do the best she can and go from there. She is fully aware of the team's recommendation and just doesn't have 24 hr care. She is planning on buying a bed for downstairs and we discussed the other equipment needed. Will continue to work toward discharge next Wed.

## 2017-01-15 ENCOUNTER — Inpatient Hospital Stay (HOSPITAL_COMMUNITY): Payer: PRIVATE HEALTH INSURANCE | Admitting: Physical Therapy

## 2017-01-15 ENCOUNTER — Inpatient Hospital Stay (HOSPITAL_COMMUNITY): Payer: PRIVATE HEALTH INSURANCE | Admitting: Occupational Therapy

## 2017-01-15 DIAGNOSIS — N390 Urinary tract infection, site not specified: Secondary | ICD-10-CM

## 2017-01-15 DIAGNOSIS — D62 Acute posthemorrhagic anemia: Secondary | ICD-10-CM

## 2017-01-15 DIAGNOSIS — I82492 Acute embolism and thrombosis of other specified deep vein of left lower extremity: Secondary | ICD-10-CM

## 2017-01-15 LAB — GLUCOSE, CAPILLARY
Glucose-Capillary: 125 mg/dL — ABNORMAL HIGH (ref 65–99)
Glucose-Capillary: 80 mg/dL (ref 65–99)
Glucose-Capillary: 75 mg/dL (ref 65–99)
Glucose-Capillary: 103 mg/dL — ABNORMAL HIGH (ref 65–99)

## 2017-01-15 NOTE — Progress Notes (Signed)
Occupational Therapy Session Note  Patient Details  Name: Traci [name] MRN: [id number] Date of Birth: Jan 13, 1976  Today's Date: 01/15/2017 OT Individual Time: 1300-1330 OT Individual Time Calculation (min): 30 min   Skilled Therapeutic Interventions/Progress Updates:    1:1 focus on side board transfers with focus on ability to place board and remove board after transfers. Pt still requires min A to place board and removed. Pt able to perform transfer with supervision with therapist providing stability for w/c. Pt sitting on RoHO cushion which was putting her into a posterior pelvic tilt and she had the tendency to come out of the chair.  Repositioned into a gel cushion with more anterior support.   Therapy Documentation Precautions:  Precautions Precautions: Fall Precaution Comments: BLE paraparesis Restrictions Weight Bearing Restrictions: No Pain: Pain Assessment Pain Assessment: 0-10 Pain Score: 0-No pain  See Function Navigator for Current Functional Status.   Therapy/Group: Individual Therapy  [name] 01/15/2017, 3:08 PM

## 2017-01-15 NOTE — Progress Notes (Signed)
Small amount of bleeding noted from hemorrhoids this shift.

## 2017-01-15 NOTE — Progress Notes (Addendum)
Physical Therapy Session Note  Patient Details  Name: Traci [name] MRN: [id number] Date of Birth: 27-Oct-1975  Today's Date: 01/15/2017 PT Individual Time: 1001-1102 1430-1531 PT Individual Time Calculation (min): 61 min, 61 min  Short Term Goals: Week 3:  PT Short Term Goal 1 (Week 3): LTG=STG  Skilled Therapeutic Interventions/Progress Updates:    Session 1: Pt in bed upon arrival, agreeable to PT. Bed Mobility: rolling and sidelying to sitting performed with supervision. Transfers: bed>w/c - min assist (working on board placement), w/c<>car using stool for LE support, min assist provided - level transfer. Working on [employer] and hand placement. Encouraging forward lean and push with UE/head hip relationship. W/C- pt propelling onto/off elevator, pressing buttons. Using handicap doors to go outside. Working on propelling on uneven surfaces, incline/decline. Following session pt returned to room, up in w/c with all needs in reach.  Session 2: Pt up in w/c upon arrival, agreeable to PT session. Working on w/c mobility with popping casters onto 1inch step. Requiring verbal, manual cues. Pt unable to pop casters without assistance. Transfers performed w/c<>mat table with slight uneven surface (approx 1 inch). Pt with mild improved scooting and control on board. Following session, pt up in w/c with all needs in reach. Discussed with pt need to finalize plans for home regarding w/c ramp as well as home/care measurements.    Therapy Documentation Precautions:  Precautions Precautions: Fall Precaution Comments: BLE paraparesis Restrictions Weight Bearing Restrictions: No   Pain: Mild low back pain.   See Function Navigator for Current Functional Status.   Therapy/Group: Individual Therapy  Linard Millers, PT 01/15/2017, 12:36 PM

## 2017-01-15 NOTE — Progress Notes (Signed)
Occupational Therapy Session Note  Patient Details  Name: Traci [name] MRN: [id number] Date of Birth: 11-21-1975  Today's Date: 01/15/2017 OT Individual Time: 0800-0902 OT Individual Time Calculation (min): 62 min    Short Term Goals: Week 3:  OT Short Term Goal 1 (Week 3): Continue working on [employer].   Skilled Therapeutic Interventions/Progress Updates:    Pt completed bathing and dressing in the bed from supine rolling to long sitting.  Min assist to achieve long/circle sitting from supine position.  All LB bathing and dressing completed unsupported except for pulling pants over hips and washing peri area.  Supervision for rolling side to side with use of the bed rails.  She was able to also pull her pants over her hips rolling this session with only supervision.  Pt still with incontinence of bladder and some bowel, requiring max assist for therapist to donn incontinence brief.  Pt left in bed at end of session with call button in reach.   Therapy Documentation Precautions:  Precautions Precautions: Fall Precaution Comments: BLE paraparesis Restrictions Weight Bearing Restrictions: No   Pain: Pain Assessment Pain Assessment: Faces Faces Pain Scale: Hurts little more Pain Type: Acute pain Pain Location: Back Pain Orientation: Lower Pain Descriptors / Indicators: Discomfort Pain Onset: On-going Pain Intervention(s): Repositioned;Emotional support ADL: See Function Navigator for Current Functional Status.   Therapy/Group: Individual Therapy  Latanya Hemmer .zxx 01/15/2017, 10:19 AM

## 2017-01-15 NOTE — Progress Notes (Signed)
[facility] PHYSICAL MEDICINE & REHABILITATION     PROGRESS NOTE  Subjective/Complaints:   Voiding , no caths, freq BMs  ROS: Denies CP, SOB, N/V/D.  Objective: Vital Signs: Blood pressure 130/62, pulse 80, temperature 98.6 F (37 C), temperature source Oral, resp. rate 18, height 5' (1.524 m), weight 119 kg (262 lb 6.4 oz), SpO2 100 %. No results found. No results for input(s): WBC, HGB, HCT, PLT in the last 72 hours. No results for input(s): NA, K, CL, GLUCOSE, BUN, CREATININE, CALCIUM in the last 72 hours.  Invalid input(s): CO CBG (last 3)   Recent Labs  01/14/17 1644 01/14/17 2213 01/15/17 0651  GLUCAP 111* 160* 125*    Wt Readings from Last 3 Encounters:  12/29/16 119 kg (262 lb 6.4 oz)  12/25/16 131.7 kg (290 lb 6.4 oz)  12/17/16 127.9 kg (282 lb)    Physical Exam:  BP 130/62 (BP Location: Right Arm)   Pulse 80   Temp 98.6 F (37 C) (Oral)   Resp 18   Ht 5' (1.524 m)   Wt 119 kg (262 lb 6.4 oz)   SpO2 100%   BMI 51.25 kg/m  Constitutional: She appears well-developed. No distress. Obese.  HENT: Normocephalicand atraumatic.  Eyes: EOMI. No discharge.  Cardiovascular: RRR. No JVD. Respiratory: Effort normal breath sounds normal.  GI: Soft. Bowel sounds are normal.   Musculoskeletal: She exhibits no edemaor tenderness.  Neurological: She is alertand oriented.  Speech clear.  Follows commands without difficulty.  Motor: B/l UE motor 5/5 prox to distal.  B/l LE: 0/5 HF, KE and ADF/PF (unchanged).  Sensation diminished to LT below inguinal area (slowly improving).  Skin: Skin is warmand dry. She is not diaphoretic.  Psychiatric: She has a normal mood and affect. Her speech is normaland behavior is normal. Judgmentand thought contentnormal. Cognition and memoryare normal.    Assessment/Plan: 1. Functional deficits secondary to Transverse Myelitis which require 3+ hours per day of interdisciplinary therapy in a comprehensive inpatient rehab  setting. Physiatrist is providing close team supervision and 24 hour management of active medical problems listed below. Physiatrist and rehab team continue to assess barriers to discharge/monitor patient progress toward functional and medical goals.  Function:  Bathing Bathing position   Position: Bed  Bathing parts Body parts bathed by patient: Right arm, Left arm, Chest, Abdomen, Right upper leg, Left upper leg, Right lower leg, Left lower leg, Front perineal area Body parts bathed by helper: Buttocks, Back  Bathing assist Assist Level: Touching or steadying assistance(Pt > 75%)      Upper Body Dressing/Undressing Upper body dressing   What is the patient wearing?: Pull over shirt/dress     Pull over shirt/dress - Perfomed by patient: Thread/unthread left sleeve, Thread/unthread right sleeve, Put head through opening, Pull shirt over trunk          Upper body assist Assist Level: Supervision or verbal cues   Set up : To obtain clothing/put away  Lower Body Dressing/Undressing Lower body dressing   What is the patient wearing?: Pants, Non-skid slipper socks, Ted Hose     Pants- Performed by patient: Thread/unthread right pants leg, Thread/unthread left pants leg, Pull pants up/down   Non-skid slipper socks- Performed by patient: Don/doff right sock, Don/doff left sock Non-skid slipper socks- Performed by helper: Don/doff left sock, Don/doff right sock             TED Hose - Performed by patient: Don/doff right TED hose, Don/doff left TED hose TED  Hose - Performed by helper: Don/doff right TED hose, Don/doff left TED hose  Lower body assist Assist for lower body dressing: Supervision or verbal cues      Toileting Toileting Toileting activity did not occur: No continent bowel/bladder event Toileting steps completed by patient: Performs perineal hygiene Toileting steps completed by helper: Adjust clothing prior to toileting, Adjust clothing after toileting Toileting  Assistive Devices: Toilet aid  Toileting assist Assist level: Set up/obtain supplies   Transfers Chair/bed transfer   Chair/bed transfer method: Lateral scoot Chair/bed transfer assist level: Touching or steadying assistance (Pt > 75%) Chair/bed transfer assistive device: Sliding board, Armrests Mechanical lift: Maximove   Locomotion Ambulation Ambulation activity did not occur: Safety/medical concerns         Wheelchair   Type: Manual Max wheelchair distance: 230 ft Assist Level: Supervision or verbal cues  Cognition Comprehension Comprehension assist level: Follows complex conversation/direction with no assist  Expression Expression assist level: Expresses complex ideas: With no assist  Social Interaction Social Interaction assist level: Interacts appropriately with others - No medications needed.  Problem Solving Problem solving assist level: Solves complex problems: Recognizes & self-corrects  Memory Memory assist level: Complete Independence: No helper    Medical Problem List and Plan: 1.  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  DVT: LLE DVT - Xarelto started 3. Pain Management:   Paraesthesias controlled on Neurontin tid and Mobic daily, does not want to increase meds at present.   Tramadol PRN 4. Mood: LCSW to follow for evaluation and support.  5. Neuropsych: This patient is capable of making decisions on her own behalf. 6. Skin/Wound Care: Educate on importance of boosting and pressure relief measures.  7. Fluids/Electrolytes/Nutrition: Monitor I/O.  8. T2DM: new diagnosis with Hgb A1C-6.9 at admission.   Monitor BS ac/hs.    Educate patient on CM diet.   SSI for now  Controlled 6/1  Cont to monitor 9. Neurogenic bowel:    Adjusting meds   Educated on need to use bedside commode 10. Morbid obesity: Educate on appropriate diet. Will require bariatric  equipment as well as skin monitoring to prevent breakdown.   11. Neurogenic bladder: foley d/ced with voiding trial  Low dose urecholine started 5/20, d/c and monitor for retention 12. Acute lower UTI  Ucx with Proteus and E.Coli  Resistant to macrobid, Keflex completed 5/19-5/26 13. Hyponatremia: Resolved  Cont to monitor 14. Transaminitis: Resolved  Cont to monitor 15. ABLA  Hb 10.2 on 5/29  Cont to monitor 16. Hemorrhoids  Anusol ordered 5/24  Educated on scheduled dosage  LOS (Days) [place] EVALUATION WAS PERFORMED  Charlett Blake 01/15/2017 7:53 AM

## 2017-01-16 ENCOUNTER — Inpatient Hospital Stay (HOSPITAL_COMMUNITY): Payer: PRIVATE HEALTH INSURANCE | Admitting: Occupational Therapy

## 2017-01-16 LAB — GLUCOSE, CAPILLARY
GLUCOSE-CAPILLARY: 84 mg/dL (ref 65–99)
GLUCOSE-CAPILLARY: 100 mg/dL — AB (ref 65–99)
Glucose-Capillary: 133 mg/dL — ABNORMAL HIGH (ref 65–99)
Glucose-Capillary: 154 mg/dL — ABNORMAL HIGH (ref 65–99)
Glucose-Capillary: 96 mg/dL (ref 65–99)

## 2017-01-16 NOTE — Progress Notes (Signed)
[facility] PHYSICAL MEDICINE & REHABILITATION     PROGRESS NOTE  Subjective/Complaints:   No new problems. Emptying bladder  ROS: pt denies nausea, vomiting, diarrhea, cough, shortness of breath or chest pain   Objective: Vital Signs: Blood pressure (!) 107/49, pulse 79, temperature 98.8 F (37.1 C), temperature source Oral, resp. rate 14, height 5' (1.524 m), weight 119 kg (262 lb 6.4 oz), SpO2 99 %. No results found. No results for input(s): WBC, HGB, HCT, PLT in the last 72 hours. No results for input(s): NA, K, CL, GLUCOSE, BUN, CREATININE, CALCIUM in the last 72 hours.  Invalid input(s): CO CBG (last 3)   Recent Labs  01/15/17 2216 01/16/17 0637 01/16/17 0818  GLUCAP 103* 133* 154*    Wt Readings from Last 3 Encounters:  12/29/16 119 kg (262 lb 6.4 oz)  12/25/16 131.7 kg (290 lb 6.4 oz)  12/17/16 127.9 kg (282 lb)    Physical Exam:  BP (!) 107/49 (BP Location: Right Arm)   Pulse 79   Temp 98.8 F (37.1 C) (Oral)   Resp 14   Ht 5' (1.524 m)   Wt 119 kg (262 lb 6.4 oz)   SpO2 99%   BMI 51.25 kg/m  Constitutional: She appears well-developed. No distress. Obese.  HENT: Normocephalicand atraumatic.  Eyes: EOMI. No discharge.  Cardiovascular: RRR no JVD Respiratory: Effort normal breath sounds normal.  GI: Soft. Bowel sounds are normal.   Musculoskeletal: She exhibits no edemaor tenderness.  Neurological: She is alertand oriented.  Speech clear.  Follows commands without difficulty.  Motor: B/l UE motor 5/5 prox to distal.  B/l LE: 0/5 HF, KE and ADF/PF (unchanged).  Sensation diminished to LT below inguinal area (slowly improving).  Skin: Skin is warmand dry. She is not diaphoretic.  Psychiatric: She has a normal mood and affect. Her speech is normaland behavior is normal. Judgmentand thought contentnormal. Cognition and memoryare normal.    Assessment/Plan: 1. Functional deficits secondary to Transverse Myelitis which require 3+ hours per day  of interdisciplinary therapy in a comprehensive inpatient rehab setting. Physiatrist is providing close team supervision and 24 hour management of active medical problems listed below. Physiatrist and rehab team continue to assess barriers to discharge/monitor patient progress toward functional and medical goals.  Function:  Bathing Bathing position   Position: Bed  Bathing parts Body parts bathed by patient: Right arm, Left arm, Chest, Abdomen, Right upper leg, Left upper leg, Right lower leg, Left lower leg, Front perineal area Body parts bathed by helper: Back  Bathing assist Assist Level: Touching or steadying assistance(Pt > 75%)      Upper Body Dressing/Undressing Upper body dressing   What is the patient wearing?: Pull over shirt/dress     Pull over shirt/dress - Perfomed by patient: Thread/unthread left sleeve, Thread/unthread right sleeve, Put head through opening, Pull shirt over trunk          Upper body assist Assist Level: Supervision or verbal cues   Set up : To obtain clothing/put away  Lower Body Dressing/Undressing Lower body dressing   What is the patient wearing?: Pants, Non-skid slipper socks, Ted Hose     Pants- Performed by patient: Thread/unthread right pants leg, Thread/unthread left pants leg, Pull pants up/down   Non-skid slipper socks- Performed by patient: Don/doff right sock, Don/doff left sock Non-skid slipper socks- Performed by helper: Don/doff left sock, Don/doff right sock             TED Hose - Performed by patient:  Don/doff right TED hose, Don/doff left TED hose TED Hose - Performed by helper: Don/doff right TED hose, Don/doff left TED hose  Lower body assist Assist for lower body dressing: Supervision or verbal cues      Toileting Toileting Toileting activity did not occur: No continent bowel/bladder event Toileting steps completed by patient: Adjust clothing prior to toileting, Performs perineal hygiene, Adjust clothing after  toileting Toileting steps completed by helper: Adjust clothing prior to toileting, Adjust clothing after toileting Stewart: Toilet aid  Toileting assist Assist level: Set up/obtain supplies, Touching or steadying assistance (Pt.75%)   Transfers Chair/bed transfer   Chair/bed transfer method: Lateral scoot Chair/bed transfer assist level: Touching or steadying assistance (Pt > 75%) Chair/bed transfer assistive device: Sliding board, Armrests Mechanical lift: Maximove   Locomotion Ambulation Ambulation activity did not occur: Safety/medical concerns         Wheelchair   Type: Manual Max wheelchair distance: 400 ft Assist Level: Supervision or verbal cues  Cognition Comprehension Comprehension assist level: Follows complex conversation/direction with no assist  Expression Expression assist level: Expresses complex ideas: With no assist  Social Interaction Social Interaction assist level: Interacts appropriately with others - No medications needed.  Problem Solving Problem solving assist level: Solves complex problems: Recognizes & self-corrects  Memory Memory assist level: Complete Independence: No helper    Medical Problem List and Plan: 1.  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  DVT: LLE DVT - Xarelto started 3. Pain Management:   Paraesthesias controlled on Neurontin tid and Mobic daily, does not want to increase meds at present.   Tramadol PRN 4. Mood: LCSW to follow for evaluation and support.  5. Neuropsych: This patient is capable of making decisions on her own behalf. 6. Skin/Wound Care: Educate on importance of boosting and pressure relief measures.  7. Fluids/Electrolytes/Nutrition: Monitor I/O.  8. T2DM: new diagnosis with Hgb A1C-6.9 at admission.   Monitor BS ac/hs.    Educate patient on CM diet.   SSI for now  Controlled 6/1  Cont  to monitor 9. Neurogenic bowel:    Adjusting meds   Educated on need to use bedside commode 10. Morbid obesity: Educate on appropriate diet. Will require bariatric equipment as well as skin monitoring to prevent breakdown.   11. Neurogenic bladder: foley d/ced with voiding trial  Low dose urecholine started 5/20, d/c'ed---PVR's low 12. Acute lower UTI  Ucx with Proteus and E.Coli  Resistant to macrobid, Keflex completed 5/19-5/26 13. Hyponatremia: Resolved  Cont to monitor 14. Transaminitis: Resolved  Cont to monitor 15. ABLA  Hb 10.2 on 5/29  Cont to monitor 16. Hemorrhoids  Anusol ordered 5/24  Educated on scheduled dosage  LOS (Days) 18 A FACE TO FACE EVALUATION WAS PERFORMED  SWARTZ,ZACHARY T 01/16/2017 8:24 AM

## 2017-01-16 NOTE — Progress Notes (Signed)
Occupational Therapy Session Note  Patient Details  Name: Traci [name] MRN: [id number] Date of Birth: Jul 27, 1976  Today's Date: 01/16/2017 OT Individual Time: 1130-1200 OT Individual Time Calculation (min): 30 min    Skilled Therapeutic Interventions/Progress Updates: Patient complete endurance activities and was assisted with periarea & buttocks cleansing with max assist (following soiled brief during the session)     She was able to complete repositioning with moderate assistance and left with call bell and phone in place at the end of the session.     Therapy Documentation Precautions:  Precautions Precautions: Fall Precaution Comments: BLE paraparesis Restrictions Weight Bearing Restrictions: No   Therapy/Group: Individual Therapy  Alfredia Ferguson [facility] 01/16/2017, 9:16 PM

## 2017-01-17 ENCOUNTER — Inpatient Hospital Stay (HOSPITAL_COMMUNITY): Payer: PRIVATE HEALTH INSURANCE | Admitting: Occupational Therapy

## 2017-01-17 LAB — GLUCOSE, CAPILLARY
GLUCOSE-CAPILLARY: 144 mg/dL — AB (ref 65–99)
Glucose-Capillary: 88 mg/dL (ref 65–99)
Glucose-Capillary: 95 mg/dL (ref 65–99)

## 2017-01-17 MED ORDER — MELOXICAM 7.5 MG PO TABS
15.0000 mg | ORAL_TABLET | Freq: Every day | ORAL | Status: DC
  Administered 2017-01-18 – 2017-01-27 (×10): 15 mg via ORAL
  Filled 2017-01-17 (×12): qty 2

## 2017-01-17 MED ORDER — MELOXICAM 7.5 MG PO TABS
7.5000 mg | ORAL_TABLET | Freq: Once | ORAL | Status: AC
  Administered 2017-01-17: 7.5 mg via ORAL
  Filled 2017-01-17 (×2): qty 1

## 2017-01-17 NOTE — Progress Notes (Addendum)
[facility] PHYSICAL MEDICINE & REHABILITATION     PROGRESS NOTE  Subjective/Complaints:   Complains of low back pain (for 3 days apparently)--no radiation of pain  ROS: pt denies nausea, vomiting, diarrhea, cough, shortness of breath or chest pain    Objective: Vital Signs: Blood pressure (!) 127/43, pulse 65, temperature 98.9 F (37.2 C), temperature source Oral, resp. rate 18, height 5' (1.524 m), weight 119 kg (262 lb 6.4 oz), SpO2 99 %. No results found. No results for input(s): WBC, HGB, HCT, PLT in the last 72 hours. No results for input(s): NA, K, CL, GLUCOSE, BUN, CREATININE, CALCIUM in the last 72 hours.  Invalid input(s): CO CBG (last 3)   Recent Labs  01/16/17 1227 01/16/17 1640 01/16/17 2130  GLUCAP 84 96 100*    Wt Readings from Last 3 Encounters:  12/29/16 119 kg (262 lb 6.4 oz)  12/25/16 131.7 kg (290 lb 6.4 oz)  12/17/16 127.9 kg (282 lb)    Physical Exam:  BP (!) 127/43 (BP Location: Left Arm)   Pulse 65   Temp 98.9 F (37.2 C) (Oral)   Resp 18   Ht 5' (1.524 m)   Wt 119 kg (262 lb 6.4 oz)   SpO2 99%   BMI 51.25 kg/m  Constitutional: She appears well-developed. No distress. Obese.  HENT: Normocephalicand atraumatic.  Eyes: EOMI. No discharge.  Cardiovascular: RRR without JVD Respiratory: Normal effort  GI: Soft. Bowel sounds are normal.   Musculoskeletal: She exhibits no edemaor tenderness.  Neurological: She is alertand oriented.  Speech clear.  Follows commands without difficulty.  Motor: B/l UE motor 5/5 prox to distal.  B/l LE: 0/5 HF, KE and ADF/PF (without change).  Sensation diminished to LT below inguinal area (some improvement).  Skin: Skin is warmand dry. She is not diaphoretic.  Psychiatric: She has a normal mood and affect. Her speech is normaland behavior is normal. Judgmentand thought contentnormal. Cognition and memoryare normal.    Assessment/Plan: 1. Functional deficits secondary to Transverse Myelitis which  require 3+ hours per day of interdisciplinary therapy in a comprehensive inpatient rehab setting. Physiatrist is providing close team supervision and 24 hour management of active medical problems listed below. Physiatrist and rehab team continue to assess barriers to discharge/monitor patient progress toward functional and medical goals.  Function:  Bathing Bathing position   Position: Bed  Bathing parts Body parts bathed by patient: Right arm, Left arm, Chest, Abdomen, Right upper leg, Left upper leg, Right lower leg, Left lower leg, Front perineal area Body parts bathed by helper: Back  Bathing assist Assist Level: Touching or steadying assistance(Pt > 75%)      Upper Body Dressing/Undressing Upper body dressing   What is the patient wearing?: Pull over shirt/dress     Pull over shirt/dress - Perfomed by patient: Thread/unthread left sleeve, Thread/unthread right sleeve, Put head through opening, Pull shirt over trunk          Upper body assist Assist Level: Supervision or verbal cues   Set up : To obtain clothing/put away  Lower Body Dressing/Undressing Lower body dressing   What is the patient wearing?: Pants, Non-skid slipper socks, Ted Hose     Pants- Performed by patient: Thread/unthread right pants leg, Thread/unthread left pants leg, Pull pants up/down   Non-skid slipper socks- Performed by patient: Don/doff right sock, Don/doff left sock Non-skid slipper socks- Performed by helper: Don/doff left sock, Don/doff right sock  TED Hose - Performed by patient: Don/doff right TED hose, Don/doff left TED hose TED Hose - Performed by helper: Don/doff right TED hose, Don/doff left TED hose  Lower body assist Assist for lower body dressing: Supervision or verbal cues      Toileting Toileting Toileting activity did not occur: No continent bowel/bladder event Toileting steps completed by patient: Adjust clothing prior to toileting, Performs perineal hygiene,  Adjust clothing after toileting Toileting steps completed by helper: Adjust clothing prior to toileting, Adjust clothing after toileting Toileting Assistive Devices: Toilet aid  Toileting assist Assist level: Set up/obtain supplies, Touching or steadying assistance (Pt.75%)   Transfers Chair/bed transfer   Chair/bed transfer method: Lateral scoot Chair/bed transfer assist level: Touching or steadying assistance (Pt > 75%) Chair/bed transfer assistive device: Sliding board, Armrests Mechanical lift: Maximove   Locomotion Ambulation Ambulation activity did not occur: Safety/medical concerns         Wheelchair   Type: Manual Max wheelchair distance: 400 ft Assist Level: Supervision or verbal cues  Cognition Comprehension Comprehension assist level: Follows complex conversation/direction with no assist  Expression Expression assist level: Expresses complex ideas: With no assist  Social Interaction Social Interaction assist level: Interacts appropriately with others - No medications needed.  Problem Solving Problem solving assist level: Solves complex problems: Recognizes & self-corrects  Memory Memory assist level: Complete Independence: No helper    Medical Problem List and Plan: 1.  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  DVT: LLE DVT - Xarelto started 3. Pain Management: complains of low back pain  -continue mobic but increase to 15mg  qd scheduled. robaxin, tramadol prn  -needs Kpad---ordered but not delivered  -Paraesthesias controlled on Neurontin tid and Mobic daily.     4. Mood: LCSW to follow for evaluation and support.  5. Neuropsych: This patient is capable of making decisions on her own behalf. 6. Skin/Wound Care: Educate on importance of boosting and pressure relief measures.  7. Fluids/Electrolytes/Nutrition: Monitor I/O.  8. T2DM: new diagnosis with Hgb  A1C-6.9 at admission.   Monitor BS ac/hs.    Educate patient on CM diet.   SSI for now  Controlled 6/1  Cont to monitor 9. Neurogenic bowel:    Adjusting meds     10. Morbid obesity: Educate on appropriate diet. Will require bariatric equipment as well as skin monitoring to prevent breakdown.   11. Neurogenic bladder: foley d/ced with voiding trial  Low dose urecholine started 5/20, d/c'ed---PVR's low, voiding well 12. Acute lower UTI  Ucx with Proteus and E.Coli  Resistant to macrobid, Keflex completed 5/19-5/26 13. Hyponatremia: Resolved  Cont to monitor 14. Transaminitis: Resolved  Cont to monitor 15. ABLA  Hb 10.2 on 5/29  Cont to monitor 16. Hemorrhoids  Anusol ordered 5/24  Educated on scheduled dosage  LOS (Days) 19 A FACE TO FACE EVALUATION WAS PERFORMED  SWARTZ,ZACHARY T 01/17/2017 8:17 AM

## 2017-01-17 NOTE — Progress Notes (Signed)
Occupational Therapy Session Note  Patient Details  Name: Traci [name] MRN: [id number] Date of Birth: 09-02-1975  Today's Date: 01/17/2017 OT Individual Time: 9563-8756 OT Individual Time Calculation (min): 43 min    Short Term Goals: Week 1:  OT Short Term Goal 1 (Week 1): Traci [name] will complete LB bathing sit to supine with mod assist.  OT Short Term Goal 1 - Progress (Week 1): Met OT Short Term Goal 2 (Week 1): Traci [name] will donn pullup shorts/pants wiith mod assist in supine position rolling. OT Short Term Goal 2 - Progress (Week 1): Met OT Short Term Goal 3 (Week 1): Traci [name] will complete sliding board transfer to the drop arm commode with max assist.   OT Short Term Goal 3 - Progress (Week 1): Met OT Short Term Goal 4 (Week 1): Traci [name] complete toilet hygiene and clothing with max assist using lateral leans in sitting.  OT Short Term Goal 4 - Progress (Week 1): Not met Week 2:  OT Short Term Goal 1 (Week 2): Traci [name] will complete toilet hygiene with max assist in sitting with lateral leans side to side OT Short Term Goal 1 - Progress (Week 2): Met OT Short Term Goal 2 (Week 2): Traci [name] will perform sliding board transfers with min assist to the drop arm commode.    OT Short Term Goal 2 - Progress (Week 2): Met OT Short Term Goal 3 (Week 2): Traci [name] will complete LB dressing in long sitting/circle sit to supine with min assist. OT Short Term Goal 3 - Progress (Week 2): Met OT Short Term Goal 4 (Week 2): Traci [name] will complete LB bathing supine rolling side to side with min assist.  OT Short Term Goal 4 - Progress (Week 2): Met Week 3:  OT Short Term Goal 1 (Week 3): Continue working on [employer].   Skilled Therapeutic Interventions/Progress Updates:    Tx focus on modified dressing skills and functional transfers.   Traci [name] greeted supine in bed. Roommate present. Traci [name] reported that d/c plan is to go home with roommate, however, roommate will be in [place] for first 4 days of pts d/c. Traci [name] is working on finding  family/friend in-home assist for that time. "I cried about it all day yesterday." Traci [name] provided emotional support and therapeutic listening, strongly encouraged her to find in-home support ASAP. Traci [name] donned Ted hose and sneakers with setup. Traci [name] asking OT to check for incontinence. Had her practice assessing her incontinent product and removing brief (supervision supine>long sit). Recommended purchasing pull ups at d/c. Traci [name] assisting with 75% of hygiene, rolling R>L in bed. Afterwards Traci [name] completed slideboard transfer with Min A for stabilizing equipment. Traci [name] able to insert board and scoot across herself (with bed slightly elevated). Traci [name] required mod instructional cues to reposition herself in new w/c (with new cushion). Traci [name] left in w/c at time of departure with all needs within reach.   Therapy Documentation Precautions:  Precautions Precautions: Fall Precaution Comments: BLE paraparesis Restrictions Weight Bearing Restrictions: No   Vital Signs: Therapy Vitals Temp: 98.8 F (37.1 C) Temp Source: Oral Pulse Rate: 97 Resp: 18 BP: 115/66 Patient Position (if appropriate): Sitting Oxygen Therapy SpO2: 98 % O2 Device: Not Delivered Pain: No c/o pain during tx    ADL:      See Function Navigator for Current Functional Status.   Therapy/Group: Individual Therapy  Kairi Tufo A Maebell Lyvers 01/17/2017, 4:56 PM

## 2017-01-18 ENCOUNTER — Inpatient Hospital Stay (HOSPITAL_COMMUNITY): Payer: PRIVATE HEALTH INSURANCE | Admitting: Occupational Therapy

## 2017-01-18 ENCOUNTER — Inpatient Hospital Stay (HOSPITAL_COMMUNITY): Payer: PRIVATE HEALTH INSURANCE | Admitting: Physical Therapy

## 2017-01-18 DIAGNOSIS — M792 Neuralgia and neuritis, unspecified: Secondary | ICD-10-CM

## 2017-01-18 LAB — GLUCOSE, CAPILLARY
GLUCOSE-CAPILLARY: 95 mg/dL (ref 65–99)
GLUCOSE-CAPILLARY: 88 mg/dL (ref 65–99)
Glucose-Capillary: 89 mg/dL (ref 65–99)
Glucose-Capillary: 81 mg/dL (ref 65–99)

## 2017-01-18 MED ORDER — DARIFENACIN HYDROBROMIDE ER 7.5 MG PO TB24
7.5000 mg | ORAL_TABLET | Freq: Every day | ORAL | Status: DC
  Administered 2017-01-18 – 2017-01-24 (×7): 7.5 mg via ORAL
  Filled 2017-01-18 (×9): qty 1

## 2017-01-18 MED ORDER — FLEET ENEMA 7-19 GM/118ML RE ENEM: 1.0000 | ENEMA | Freq: Every day | RECTAL | Status: DC | PRN

## 2017-01-18 MED ORDER — SENNA 8.6 MG PO TABS
1.0000 | ORAL_TABLET | Freq: Every day | ORAL | Status: DC
  Filled 2017-01-18: qty 1

## 2017-01-18 NOTE — Progress Notes (Addendum)
Social Work Patient ID: Traci [name], female   DOB: 1976-06-16, 41 y.o.   MRN: [id number]  Met with pt to discuss the concerns regarding going home on Wed. She does not have anyone to assist her and will need to do the best That she can for herself. She requires assist with transfers, her bowel and bladder programs and overall care. She would be in agreement with going to a NH if her insurance would cover it. Will begin the NH process.   Spoke with Amy-CM for insurance and she wants worker to find a NH and she will negotiate a rate with them.

## 2017-01-18 NOTE — Progress Notes (Signed)
[facility] PHYSICAL MEDICINE & REHABILITATION     PROGRESS NOTE  Subjective/Complaints:  Pt seen laying in bed this AM.  She slept well overnight.  She notes she is on her period.   ROS: Denies CP, SOB, N/V/D.   Objective: Vital Signs: Blood pressure 126/65, pulse 75, temperature 98.5 F (36.9 C), temperature source Oral, resp. rate 18, height 5' (1.524 m), weight 119 kg (262 lb 6.4 oz), SpO2 99 %. No results found. No results for input(s): WBC, HGB, HCT, PLT in the last 72 hours. No results for input(s): NA, K, CL, GLUCOSE, BUN, CREATININE, CALCIUM in the last 72 hours.  Invalid input(s): CO CBG (last 3)   Recent Labs  01/17/17 1635 01/17/17 2110 01/18/17 0620  GLUCAP 144* 95 95    Wt Readings from Last 3 Encounters:  12/29/16 119 kg (262 lb 6.4 oz)  12/25/16 131.7 kg (290 lb 6.4 oz)  12/17/16 127.9 kg (282 lb)    Physical Exam:  BP 126/65 (BP Location: Left Wrist)   Pulse 75   Temp 98.5 F (36.9 C) (Oral)   Resp 18   Ht 5' (1.524 m)   Wt 119 kg (262 lb 6.4 oz)   SpO2 99%   BMI 51.25 kg/m  Constitutional: She appears well-developed. No distress. Obese.  HENT: Normocephalicand atraumatic.  Eyes: EOMI. No discharge.  Cardiovascular: RRR without JVD Respiratory: Normal effort. Clear. GI: Soft. Bowel sounds are normal.   Musculoskeletal: She exhibits no edemaor tenderness.  Neurological: She is alertand oriented.  Speech clear.  Follows commands without difficulty.  Motor: B/l UE motor 5/5 prox to distal.  B/l LE: 0/5 HF, KE and ADF/PF (without change).  Sensation diminished to LT below inguinal area (stable).  Skin: Skin is warmand dry. She is not diaphoretic.  Psychiatric: She has a normal mood and affect. Her speech is normaland behavior is normal. Judgmentand thought contentnormal. Cognition and memoryare normal.    Assessment/Plan: 1. 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 Physiatrist is providing close team supervision and 24 hour management of active medical problems listed below. Physiatrist and rehab team continue to assess barriers to discharge/monitor patient progress toward functional and medical goals.  Function:  Bathing Bathing position   Position: Bed  Bathing parts Body parts bathed by patient: Right arm, Left arm, Chest, Abdomen, Right upper leg, Left upper leg, Right lower leg, Left lower leg, Front perineal area Body parts bathed by helper: Back  Bathing assist Assist Level: Touching or steadying assistance(Pt > 75%)      Upper Body Dressing/Undressing Upper body dressing   What is the patient wearing?: Pull over shirt/dress     Pull over shirt/dress - Perfomed by patient: Thread/unthread left sleeve, Thread/unthread right sleeve, Put head through opening, Pull shirt over trunk          Upper body assist Assist Level: Supervision or verbal cues   Set up : To obtain clothing/put away  Lower Body Dressing/Undressing Lower body dressing   What is the patient wearing?: Ted Hose, Shoes     Pants- Performed by patient: Thread/unthread right pants leg, Thread/unthread left pants leg, Pull pants up/down   Non-skid slipper socks- Performed by patient: Don/doff right sock, Don/doff left sock Non-skid slipper socks- Performed by helper: Don/doff left sock, Don/doff right sock     Shoes - Performed by patient: Don/doff right shoe, Don/doff left shoe, Fasten right, Fasten left  TED Hose - Performed by patient: Don/doff right TED hose, Don/doff left TED hose TED Hose - Performed by helper: Don/doff right TED hose, Don/doff left TED hose  Lower body assist Assist for lower body dressing: Supervision or verbal cues      Toileting Toileting Toileting activity did not occur: No continent bowel/bladder event Toileting steps completed by patient: Adjust clothing prior to toileting, Performs  perineal hygiene, Adjust clothing after toileting Toileting steps completed by helper: Adjust clothing prior to toileting, Adjust clothing after toileting Toileting Assistive Devices: Toilet aid  Toileting assist Assist level: Set up/obtain supplies, Touching or steadying assistance (Pt.75%)   Transfers Chair/bed transfer   Chair/bed transfer method: Lateral scoot Chair/bed transfer assist level: Touching or steadying assistance (Pt > 75%) Chair/bed transfer assistive device: Sliding board, Armrests Mechanical lift: Maximove   Locomotion Ambulation Ambulation activity did not occur: Safety/medical concerns         Wheelchair   Type: Manual Max wheelchair distance: 400 ft Assist Level: Supervision or verbal cues  Cognition Comprehension Comprehension assist level: Follows complex conversation/direction with no assist  Expression Expression assist level: Expresses complex ideas: With no assist  Social Interaction Social Interaction assist level: Interacts appropriately with others - No medications needed.  Problem Solving Problem solving assist level: Solves complex problems: Recognizes & self-corrects  Memory Memory assist level: Complete Independence: No helper    Medical Problem List and Plan: 1.  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  DVT: LLE DVT - Xarelto started 3. Pain Management: complains of low back pain  -continue mobic but increase to 15mg  qd scheduled. robaxin, tramadol prn  -needs Kpad---ordered but not delivered  -Paraesthesias controlled on Neurontin tid and Mobic daily.  4. Mood: LCSW to follow for evaluation and support.  5. Neuropsych: This patient is capable of making decisions on her own behalf. 6. Skin/Wound Care: Educate on importance of boosting and pressure relief measures.  7. Fluids/Electrolytes/Nutrition: Monitor I/O.  8. T2DM: new diagnosis with Hgb A1C-6.9 at  admission.   Monitor BS ac/hs.    Educate patient on CM diet.   SSI for now  Controlled 6/4  Cont to monitor 9. Neurogenic bowel:    Adjusting meds  10. Morbid obesity: Educate on appropriate diet. Will require bariatric equipment as well as skin monitoring to prevent breakdown.   11. Neurogenic bladder: foley d/ced with voiding trial  Low dose urecholine started 5/20, d/c'ed   Started on Enalbrex 7.5 on 6/4 12. Acute lower UTI  Ucx with Proteus and E.Coli  Resistant to macrobid, Keflex completed 5/19-5/26 13. Hyponatremia: Resolved  Cont to monitor 14. Transaminitis: Resolved  Cont to monitor 15. ABLA  Hb 10.2 on 5/29  Cont to monitor 16. Hemorrhoids  Anusol ordered 5/24  Educated on scheduled dosage  LOS (Days) 20 A FACE TO FACE EVALUATION WAS PERFORMED  Ankit Lorie Phenix 01/18/2017 9:22 AM

## 2017-01-18 NOTE — Progress Notes (Addendum)
Physical Therapy Session Note  Patient Details  Name: Traci [name] MRN: [id number] Date of Birth: [birth year]  Today's Date: 01/18/2017 PT Individual Time: 0905-1002, 0272-5366 PT Individual Time Calculation (min): 57 min, 75 min  Short Term Goals: Week 3:  PT Short Term Goal 1 (Week 3): LTG=STG  Skilled Therapeutic Interventions/Progress Updates:    Session 1: Pt is in bed upon arrival, agreeable to PT session. Exercise: ROM performed to bilateral LEs including dorsiflexion, hip/knee flexion, hip abd/add - bilaterally. Bed Mobility: rolling supervision with use of rails, supine<>sitting supervision with rails and leg loops. Providing cues for use of loops. Sitting >supine more difficulty with LE management. Pt unable to go supine in good positioning, needing rolling, scooting to position. Transfer: bed<>w/c performed with sliding board and close supervision. Transfer performed half way to chair but having to return to bed due to incontinence of bladder. Previously, time spent with pt donning pants, TED hose, shoes. Pt requesting nursing assistance to clean. Pt in bed with all needs in reach after session.  Session 2: Pt sitting in w/c upon arrival, agreeable to session. Pt propelling w/c in halls throughout session with supervision. Pt able to remove leg rests with supervision. Working on Fish farm manager for propelling on uneven surfaces. Pt requiring assistance to lift casters off floor. Transfers: w/c<>mat table X6 with mild height difference (approx. 1 inch). Working on board placement as well as lateral push. Sit<>stand in standing frame performed X 8 min with UE tasks for increased core activation. Following session, pt returned to her room, up in w/c with all needs in reach.  Therapy Documentation Precautions:  Precautions Precautions: Fall Precaution Comments: BLE paraparesis Restrictions Weight Bearing Restrictions: No   Pain: Pain Assessment Pain Assessment: No/denies pain Pain  Score: 0-No pain  See Function Navigator for Current Functional Status.   Therapy/Group: Individual Therapy  Linard Millers, PT 01/18/2017, 12:29 PM

## 2017-01-18 NOTE — Progress Notes (Signed)
Occupational Therapy Session Note  Patient Details  Name: Traci [name] MRN: [id number] Date of Birth: [birth year]  Today's Date: 01/18/2017 OT Individual Time: 9485-4627 OT Individual Time Calculation (min): 59 min    Short Term Goals: Week 3:  OT Short Term Goal 1 (Week 3): Continue working on [employer].   Skilled Therapeutic Interventions/Progress Updates:    Pt completed LB dressing in bed during session from long sitting position.  Initially she was up in the chair from recent toileting attempt and completed transfer back to the bed with min assist and use of sliding board.  Utilized reacher for donning pants over feet before realizing that her brief also needed to be changed.  Transfer to supine once pants were donned over her feet and pulled up to her hips. Once in the bed she was able to complete rolling side to side for peri washing and for pulling pants over hips with supervision using the rails.  Min assist for transfer back to the wheelchair from bed to complete session.  Pt reporting that her room mate will be out of town this weekend.  Note pt in current need of a hospital bed at home, which she states there isn't room for as well as the need for 24 hour supervision.   Feel she will not be safe at home alone and cannot function safely relying on a couch to sleep and dress on.  Recommend transition to SNF for further rehab.    Therapy Documentation Precautions:  Precautions Precautions: Fall Precaution Comments: BLE paraparesis Restrictions Weight Bearing Restrictions: No  Pain: Pain Assessment Pain Assessment: No/denies pain Pain Score: 0-No pain ADL:  See Function Navigator for Current Functional Status.   Therapy/Group: Individual Therapy  Coreyon Nicotra OTR/L 01/18/2017, 12:50 PM

## 2017-01-19 ENCOUNTER — Inpatient Hospital Stay (HOSPITAL_COMMUNITY): Payer: PRIVATE HEALTH INSURANCE | Admitting: Occupational Therapy

## 2017-01-19 ENCOUNTER — Inpatient Hospital Stay (HOSPITAL_COMMUNITY): Payer: PRIVATE HEALTH INSURANCE | Admitting: Physical Therapy

## 2017-01-19 LAB — BASIC METABOLIC PANEL
Anion gap: 10 (ref 5–15)
BUN: 10 mg/dL (ref 6–20)
CHLORIDE: 102 mmol/L (ref 101–111)
CO2: 25 mmol/L (ref 22–32)
CREATININE: 0.58 mg/dL (ref 0.44–1.00)
Calcium: 9.1 mg/dL (ref 8.9–10.3)
GFR calc non Af Amer: >60 mL/min (ref >60)
Glucose, Bld: 113 mg/dL — ABNORMAL HIGH (ref 65–99)
POTASSIUM: 3.7 mmol/L (ref 3.5–5.1)
SODIUM: 137 mmol/L (ref 135–145)

## 2017-01-19 LAB — GLUCOSE, CAPILLARY
GLUCOSE-CAPILLARY: 102 mg/dL — AB (ref 65–99)
GLUCOSE-CAPILLARY: 113 mg/dL — AB (ref 65–99)
Glucose-Capillary: 109 mg/dL — ABNORMAL HIGH (ref 65–99)
Glucose-Capillary: 104 mg/dL — ABNORMAL HIGH (ref 65–99)

## 2017-01-19 LAB — CBC
HEMATOCRIT: 34.1 % — AB (ref 36.0–46.0)
Hemoglobin: 10.6 g/dL — ABNORMAL LOW (ref 12.0–15.0)
MCH: 25.3 pg — ABNORMAL LOW (ref 26.0–34.0)
MCHC: 31.1 g/dL (ref 30.0–36.0)
MCV: 81.4 fL (ref 78.0–100.0)
PLATELETS: 288 10*3/uL (ref 150–400)
RBC: 4.19 MIL/uL (ref 3.87–5.11)
RDW: 15.9 % — ABNORMAL HIGH (ref 11.5–15.5)
WBC: 4.9 10*3/uL (ref 4.0–10.5)

## 2017-01-19 MED ORDER — SENNOSIDES-DOCUSATE SODIUM 8.6-50 MG PO TABS
2.0000 | ORAL_TABLET | Freq: Every day | ORAL | Status: DC
  Administered 2017-01-19 – 2017-01-21 (×3): 2 via ORAL
  Filled 2017-01-19 (×3): qty 2

## 2017-01-19 NOTE — NC FL2 (Signed)
[place] LEVEL OF CARE SCREENING TOOL     IDENTIFICATION  Patient Name: Traci [name] Birthdate: [birth year] Sex: female Admission Date (Current Location): 12/29/2016  [facility] and [place] Number:  Herbalist and Address:  [facility] [place], [employer] [address], [place], [place] [address]      Provider Number: [id number]  Attending Physician Name and Address:  Jamse Arn, MD  Relative Name and Phone Number:  Rosalyn-Friend  [phone]    Current Level of Care: Other (Comment) (rehab) Recommended Level of Care: [place] Prior Approval Number:    Date Approved/Denied:   PASRR Number: [id number]  Discharge Plan: SNF    Current Diagnoses: Patient Active Problem List   Diagnosis Date Noted  . Hemorrhoids   . Neuropathic pain   . Acute blood loss anemia   . Transaminitis   . Acute lower UTI   . Hyponatremia   . Peroneal DVT (deep venous thrombosis), left ([place])   . Thoracic myelopathy   . Neurogenic bladder   . Neurogenic bowel   . Bilateral leg weakness   . 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 Myelitis ([place])   . Paresthesia of both lower extremities   . Morbid obesity ([place])   . Spinal stenosis of lumbar region   . DDD (degenerative disc disease), lumbosacral   . Diabetes mellitus type 2 in obese ([place])   . Paraplegia ([place])   . Transverse myelitis ([place]) 12/25/2016  . Steroid-induced hyperglycemia 12/25/2016    Orientation RESPIRATION BLADDER Height & Weight     Self, Time, Situation, Place  Normal Incontinent (bladder program) Weight: 262 lb 6.4 oz (119 kg) Height:  5' (152.4 cm)  BEHAVIORAL SYMPTOMS/MOOD NEUROLOGICAL BOWEL NUTRITION STATUS      Incontinent (bowel program) Diet (regular diet)  AMBULATORY STATUS COMMUNICATION OF NEEDS Skin   Extensive Assist Verbally Normal                       Personal Care Assistance Level of Assistance  Bathing, Dressing Bathing Assistance: Limited  assistance   Dressing Assistance: Limited assistance     Functional Limitations Info  Sight Sight Info: Impaired        SPECIAL CARE FACTORS FREQUENCY  Bowel and bladder program, OT (By licensed OT), PT (By licensed PT)     PT Frequency: 5x week OT Frequency: 5x week Bowel and Bladder Program Frequency: Bladder medicines to empty and bowel program-suppository daily          Contractures Contractures Info: Not present    Additional Factors Info  Code Status, Allergies Code Status Info: Full Allergies Info: NKA           Current Medications (01/19/2017):  This is the current hospital active medication list Current Facility-Administered Medications  Medication Dose Route Frequency Provider Last Rate Last Dose  . acetaminophen (TYLENOL) tablet 650 mg  650 mg Oral QID Jamse Arn, MD   650 mg at 01/19/17 0645  . alum & mag hydroxide-simeth (MAALOX/MYLANTA) 200-200-20 MG/5ML suspension 30 mL  30 mL Oral Q4H PRN Love, Pamela S, PA-C      . bisacodyl (DULCOLAX) suppository 10 mg  10 mg Rectal Q1200 Bary Leriche, PA-C   10 mg at 01/19/17 0536  . darifenacin (ENABLEX) 24 hr tablet 7.5 mg  7.5 mg Oral Daily Jamse Arn, MD   7.5 mg at 01/19/17 3267  . diphenhydrAMINE (BENADRYL) 12.5 MG/5ML elixir 12.5-25 mg  12.5-25 mg Oral Q6H PRN Love, Pamela S, PA-C      . gabapentin (NEURONTIN) capsule 300 mg  300 mg Oral TID Love, Pamela S, PA-C   300 mg at 01/19/17 1791  . guaiFENesin-dextromethorphan (ROBITUSSIN DM) 100-10 MG/5ML syrup 5-10 mL  5-10 mL Oral Q6H PRN Love, Pamela S, PA-C      . hydrocortisone (ANUSOL-HC) 2.5 % rectal cream   Rectal BID Jamse Arn, MD      . insulin aspart (novoLOG) injection 0-5 Units  0-5 Units Subcutaneous QHS Love, Pamela S, PA-C      . insulin aspart (novoLOG) injection 0-9 Units  0-9 Units Subcutaneous TID WC LoveIvan Anchors, PA-C   1 Units at 01/17/17 1715  . meloxicam (MOBIC) tablet 15 mg  15 mg Oral Daily Meredith Staggers, MD   15 mg  at 01/19/17 5056  . methocarbamol (ROBAXIN) tablet 500 mg  500 mg Oral Q6H PRN Bary Leriche, PA-C   500 mg at 01/18/17 2008  . nystatin (MYCOSTATIN/NYSTOP) topical powder   Topical TID Love, Pamela S, PA-C      . polyethylene glycol (MIRALAX / GLYCOLAX) packet 17 g  17 g Oral Daily PRN Love, Pamela S, PA-C      . prochlorperazine (COMPAZINE) tablet 5-10 mg  5-10 mg Oral Q6H PRN Bary Leriche, PA-C   5 mg at 01/02/17 0746   Or  . prochlorperazine (COMPAZINE) injection 5-10 mg  5-10 mg Intramuscular Q6H PRN Love, Pamela S, PA-C       Or  . prochlorperazine (COMPAZINE) suppository 12.5 mg  12.5 mg Rectal Q6H PRN Love, Ivan Anchors, PA-C      . Rivaroxaban (XARELTO) tablet 15 mg  15 mg Oral BID WC Bary Leriche, PA-C   15 mg at 01/19/17 9794  . [START ON 01/21/2017] rivaroxaban (XARELTO) tablet 20 mg  20 mg Oral Q supper Jamse Arn, MD      . senna-docusate (Senokot-S) tablet 2 tablet  2 tablet Oral QHS Jamse Arn, MD      . sodium phosphate (FLEET) 7-19 GM/118ML enema 1 enema  1 enema Rectal Daily PRN Love, Pamela S, PA-C      . traMADol Veatrice Bourbon) tablet 50 mg  50 mg Oral Q4H PRN Bary Leriche, PA-C   50 mg at 01/18/17 2008  . traZODone (DESYREL) tablet 25-50 mg  25-50 mg Oral QHS PRN Bary Leriche, PA-C         Discharge Medications: Please see discharge summary for a list of discharge medications.  Relevant Imaging Results:  Relevant Lab Results:   Additional Information SSN: [id number]  Polina Burmaster, Gardiner Rhyme, LCSW

## 2017-01-19 NOTE — Progress Notes (Signed)
Occupational Therapy Session Note  Patient Details  Name: Traci [name] MRN: [id number] Date of Birth: Oct 31, 1975  Today's Date: 01/19/2017 OT Individual Time: 8786-7672 OT Individual Time Calculation (min): 43 min    Short Term Goals: Week 3:  OT Short Term Goal 1 (Week 3): Continue working on [employer].   Skilled Therapeutic Interventions/Progress Updates:    Pt transferred to sitting from supine with supervision using the bed rail for support.  Min assist for sliding board transfer to the wheelchair from the bed.  She was able to propel her wheelchair down to the laundry room with modified independence.  She utilized Secondary school teacher from wheelchair level to place clothing in the washing machine and to add detergent tab and start, all with supervision.  Next had pt transfer to therapy mat in the gym with min assist using the sliding board.  Worked on transitions from supine to long sitting with overall min assist, progressing to supervision.  Once she completed 3 repetitions of this task with rest break in- between.  Transferred back to the room via wheelchair to complete session.    Therapy Documentation Precautions:  Precautions Precautions: Fall Precaution Comments: BLE paraparesis Restrictions Weight Bearing Restrictions: No  Pain: Pain Assessment Pain Assessment: Faces Pain Score: 6  Pain Type: Acute pain Pain Location: Back Pain Orientation: Lower Pain Descriptors / Indicators: Discomfort Pain Onset: Gradual Pain Intervention(s): Repositioned ADL: See Function Navigator for Current Functional Status.   Therapy/Group: Individual Therapy  Alyanna Stoermer OTR/L 01/19/2017, 4:06 PM

## 2017-01-19 NOTE — Progress Notes (Signed)
Recreational Therapy Session Note  Patient Details  Name: Traci [name] MRN: [id number] Date of Birth: 19-Nov-1975 Today's Date: 01/19/2017  Pain: no c/o Skilled Therapeutic Interventions/Progress Updates: Session spent discussing discharge plans as pt now states that she does not support of her room mate at discharge.  Pt problem solving through current barriers and attempting to identify options.  Pt continuing to state that things will work out.  Emotional support provided.  [place] 01/19/2017, 11:48 AM

## 2017-01-19 NOTE — Progress Notes (Signed)
Occupational Therapy Session Note  Patient Details  Name: Traci [name] MRN: [id number] Date of Birth: 04/19/1976  Today's Date: 01/19/2017 OT Individual Time: 1035-1200 OT Individual Time Calculation (min): 85 min    Short Term Goals: Week 3:  OT Short Term Goal 1 (Week 3): Continue working on [employer].   Skilled Therapeutic Interventions/Progress Updates:    Pt completed LB dressing, UB dressing, and UB bathing to start session.  She was able to transition to circle sitting in the bed with supervision and use of the rails with supervision.  She donned her pull up pants with setup over her LEs in this position.  Transition back to supine for rolling side to side to wash peri area and donn new brief, as well as for pulling pants over her hips.  She then transitioned back to circle sitting to donn leg loops.  Next she completed sliding board transfer to the wheelchair and then transferred again to therapy mat at the same level.  She continues to need min assist for positioning of the board but can remove it after transfer with supervision.  Began working on rolling on the mat when pt was noted to have bowel accident.  Had her transfer back to the wheelchair and then back to the bed at the previous stated transfer levels.  Pt assisted with removal of pants below hips with therapist assisting with all other aspects of cleaning up and donning new brief and gown.  Pt left in bed with call button and phone in reach.    Therapy Documentation Precautions:  Precautions Precautions: Fall Precaution Comments: BLE paraparesis Restrictions Weight Bearing Restrictions: No  Pain: Pain Assessment Pain Assessment: 0-10 Pain Score: 8  Pain Type: Acute pain Pain Location: Back Pain Orientation: Right;Left Pain Descriptors / Indicators: Aching Pain Frequency: Intermittent Pain Onset: Gradual Pain Intervention(s): Medication (See eMAR) ADL: See Function Navigator for Current Functional  Status.   Therapy/Group: Individual Therapy  Anaja Monts OTR/L 01/19/2017, 12:30 PM

## 2017-01-19 NOTE — Progress Notes (Signed)
Physical Therapy Session Note  Patient Details  Name: Traci [name] MRN: [id number] Date of Birth: Jul 03, 1976  Today's Date: 01/19/2017 PT Individual Time: 0800-0915 PT Individual Time Calculation (min): 75 min   Short Term Goals: Week 3:  PT Short Term Goal 1 (Week 3): LTG=STG  Skilled Therapeutic Interventions/Progress Updates:    Pt in bed upon arrival, agreeable to PT session. Pt donning TED hose, pants, shirt without physical assistance. Pt able to roll bilaterally using rail for donning pants. Transfer: bed>w/c min assist with LE management using sliding board, w/c<>mat table working on board placement and scooting technique. W/c sliding laterally during transfer, PT providing additional support with pt able to complete transfer safety. Towel roll placed at lumbar level in w/c for improved lumbar support. Pt propelling from gym to room, 1 rest break due to fatigue. Pt up in w/c with all needs in reach after session.    Therapy Documentation Precautions:  Precautions Precautions: Fall Precaution Comments: BLE paraparesis Restrictions Weight Bearing Restrictions: No  Pain: 6/10 - low back  See Function Navigator for Current Functional Status.   Therapy/Group: Individual Therapy  Linard [place], PT 01/19/2017, 8:05 AM

## 2017-01-19 NOTE — Plan of Care (Signed)
Problem: RH Balance Goal: LTG Patient will maintain dynamic standing balance (PT) LTG:  Patient will maintain dynamic standing balance with assistance during mobility activities (PT)  Outcome: Not Applicable Date Met: 65/68/12 Discontinued goal, not appropriate. 01/19/17  Problem: RH Bed Mobility Goal: LTG Patient will perform bed mobility with assist (PT) LTG: Patient will perform bed mobility with assistance, with/without cues (PT).  Goal progressed 01/19/17

## 2017-01-19 NOTE — Plan of Care (Signed)
Problem: RH Dressing Goal: LTG Patient will perform lower body dressing w/assist (OT) LTG: Patient will perform lower body dressing with assist, with/without cues in positioning using equipment (OT)  Goal updated secondary to progress.

## 2017-01-19 NOTE — Progress Notes (Signed)
[place] PHYSICAL MEDICINE & REHABILITATION     PROGRESS NOTE  Subjective/Complaints:  Pt seen laying in bed this AM.  She slept well overnight.  She notes improvement in bladder with medications.  However, no improvement with bowels.   ROS: Denies CP, SOB, N/V/D.   Objective: Vital Signs: Blood pressure 133/65, pulse 75, temperature 98.5 F (36.9 C), temperature source Oral, resp. rate 18, height 5' (1.524 m), weight 119 kg (262 lb 6.4 oz), SpO2 100 %. No results found.  Recent Labs  01/19/17 0453  WBC 4.9  HGB 10.6*  HCT 34.1*  PLT 288    Recent Labs  01/19/17 0453  NA 137  K 3.7  CL 102  GLUCOSE 113*  BUN 10  CREATININE 0.58  CALCIUM 9.1   CBG (last 3)   Recent Labs  01/18/17 1659 01/18/17 2150 01/19/17 0558  GLUCAP 81 88 109*    Wt Readings from Last 3 Encounters:  12/29/16 119 kg (262 lb 6.4 oz)  12/25/16 131.7 kg (290 lb 6.4 oz)  12/17/16 127.9 kg (282 lb)    Physical Exam:  BP 133/65 (BP Location: Left Wrist)   Pulse 75   Temp 98.5 F (36.9 C) (Oral)   Resp 18   Ht 5' (1.524 m)   Wt 119 kg (262 lb 6.4 oz)   SpO2 100%   BMI 51.25 kg/m  Constitutional: She appears well-developed. No distress. Obese.  HENT: Normocephalicand atraumatic.  Eyes: EOMI. No discharge.  Cardiovascular: RRR without JVD Respiratory: Normal effort. Clear. GI: Soft. Bowel sounds are normal.   Musculoskeletal: She exhibits no edemaor tenderness.  Neurological: She is alertand oriented.  Speech clear.  Follows commands without difficulty.  Motor: B/l UE motor 5/5 prox to distal.  B/l LE: 0/5 HF, KE and ADF/PF (stable).  Sensation diminished to LT below inguinal area (stable).  Skin: Skin is warmand dry. She is not diaphoretic.  Psychiatric: She has a normal mood and affect. Her speech is normaland behavior is normal. Judgmentand thought contentnormal. Cognition and memoryare normal.    Assessment/Plan: 1. 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 Physiatrist is providing close team supervision and 24 hour management of active medical problems listed below. Physiatrist and rehab team continue to assess barriers to discharge/monitor patient progress toward functional and medical goals.  Function:  Bathing Bathing position   Position: Bed  Bathing parts Body parts bathed by patient: Right arm, Left arm, Chest, Abdomen, Right upper leg, Left upper leg, Right lower leg, Left lower leg, Front perineal area Body parts bathed by helper: Back  Bathing assist Assist Level: Touching or steadying assistance(Pt > 75%)      Upper Body Dressing/Undressing Upper body dressing   What is the patient wearing?: Pull over shirt/dress     Pull over shirt/dress - Perfomed by patient: Thread/unthread left sleeve, Thread/unthread right sleeve, Put head through opening, Pull shirt over trunk          Upper body assist Assist Level: Supervision or verbal cues   Set up : To obtain clothing/put away  Lower Body Dressing/Undressing Lower body dressing   What is the patient wearing?: Pants, Shoes     Pants- Performed by patient: Thread/unthread right pants leg, Thread/unthread left pants leg, Pull pants up/down   Non-skid slipper socks- Performed by patient: Don/doff right sock, Don/doff left sock Non-skid slipper socks- Performed by helper: Don/doff left sock, Don/doff right  sock   Socks - Performed by helper: Don/doff right sock, Don/doff left sock Shoes - Performed by patient: Don/doff right shoe, Don/doff left shoe, Fasten right, Fasten left       TED Hose - Performed by patient: Don/doff right TED hose, Don/doff left TED hose TED Hose - Performed by helper: Don/doff right TED hose, Don/doff left TED hose  Lower body assist Assist for lower body dressing: Supervision or verbal cues      Toileting Toileting Toileting activity  did not occur: No continent bowel/bladder event Toileting steps completed by patient: Adjust clothing prior to toileting, Performs perineal hygiene, Adjust clothing after toileting (bed level) Toileting steps completed by helper: Adjust clothing prior to toileting, Adjust clothing after toileting Toileting Assistive Devices: Toilet aid  Toileting assist Assist level: Set up/obtain supplies, Touching or steadying assistance (Pt.75%)   Transfers Chair/bed transfer   Chair/bed transfer method: Lateral scoot Chair/bed transfer assist level: Touching or steadying assistance (Pt > 75%) Chair/bed transfer assistive device: Armrests, Sliding board Mechanical lift: Maximove   Locomotion Ambulation Ambulation activity did not occur: Safety/medical concerns         Wheelchair   Type: Manual Max wheelchair distance: 250 ft Assist Level: Supervision or verbal cues  Cognition Comprehension Comprehension assist level: Follows complex conversation/direction with no assist  Expression Expression assist level: Expresses complex ideas: With no assist  Social Interaction Social Interaction assist level: Interacts appropriately with others - No medications needed.  Problem Solving Problem solving assist level: Solves complex problems: Recognizes & self-corrects  Memory Memory assist level: Complete Independence: No helper    Medical Problem List and Plan: 1.  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  DVT: LLE DVT - Xarelto started 3. Pain Management: complains of low back pain  -continue mobic but increase to 15mg  qd scheduled. robaxin, tramadol prn  -needs Kpad---ordered but not delivered  -Paraesthesias controlled on Neurontin tid and Mobic daily.  4. Mood: LCSW to follow for evaluation and support.  5. Neuropsych: This patient is capable of making decisions on her own behalf. 6. Skin/Wound Care: Educate on  importance of boosting and pressure relief measures.  7. Fluids/Electrolytes/Nutrition: Monitor I/O.   BMP within acceptable range on 6/5 8. T2DM: new diagnosis with Hgb A1C-6.9 at admission.   Monitor BS ac/hs.    Educate patient on CM diet.   SSI for now  Controlled 6/5  Cont to monitor 9. Neurogenic bowel:    Adjusting meds further 10. Morbid obesity: Educate on appropriate diet. Will require bariatric equipment as well as skin monitoring to prevent breakdown.   11. Neurogenic bladder: foley d/ced with voiding trial  Low dose urecholine started 5/20, d/c'ed   Started on Enalbrex 7.5 on 6/4, improving 12. Acute lower UTI  Ucx with Proteus and E.Coli  Resistant to macrobid, Keflex completed 5/19-5/26 13. Hyponatremia: Resolved  Cont to monitor 14. Transaminitis: Resolved  Cont to monitor 15. ABLA  Hb 10.6 on 6/5  Cont to monitor 16. Hemorrhoids  Anusol ordered 5/24  Educated on scheduled dosage  LOS (Days) 21 A FACE TO FACE EVALUATION WAS PERFORMED  Goble Fudala Lorie Phenix 01/19/2017 9:00 AM

## 2017-01-19 NOTE — Plan of Care (Signed)
Problem: SCI BOWEL ELIMINATION Goal: RH STG MANAGE BOWEL WITH ASSISTANCE STG Manage Bowel with Mod Assistance.   Outcome: Not Progressing Total assist-dig stim   Problem: SCI BLADDER ELIMINATION Goal: RH STG MANAGE BLADDER WITH MEDICATION WITH ASSISTANCE STG Manage Bladder With Medication With Min Assistance.   Outcome: Not Progressing Total assist-incontinent

## 2017-01-20 ENCOUNTER — Inpatient Hospital Stay (HOSPITAL_COMMUNITY): Payer: PRIVATE HEALTH INSURANCE | Admitting: Occupational Therapy

## 2017-01-20 ENCOUNTER — Inpatient Hospital Stay (HOSPITAL_COMMUNITY): Payer: PRIVATE HEALTH INSURANCE | Admitting: Physical Therapy

## 2017-01-20 DIAGNOSIS — K649 Unspecified hemorrhoids: Secondary | ICD-10-CM

## 2017-01-20 LAB — GLUCOSE, CAPILLARY
GLUCOSE-CAPILLARY: 163 mg/dL — AB (ref 65–99)
GLUCOSE-CAPILLARY: 92 mg/dL (ref 65–99)
Glucose-Capillary: 107 mg/dL — ABNORMAL HIGH (ref 65–99)
Glucose-Capillary: 129 mg/dL — ABNORMAL HIGH (ref 65–99)

## 2017-01-20 MED ORDER — POLYETHYLENE GLYCOL 3350 17 G PO PACK
17.0000 g | PACK | Freq: Every day | ORAL | Status: DC
  Administered 2017-01-20 – 2017-01-21 (×2): 17 g via ORAL
  Filled 2017-01-20 (×2): qty 1

## 2017-01-20 NOTE — Progress Notes (Signed)
Social Work Patient ID: Traci [name], female   DOB: 27-Oct-1975, 41 y.o.   MRN: [id number]  Met with pt to discuss places interested in taking her, she wants to stay in [place] and wants this worker to contact [facility] Have contacted them and will await negoitation of cost with insurance. Pt aware and will await return contact from facility. Pt aware of team conference progress and main issue is her bowel and bladder issues along With transfers. Will contact in am and see if any information.

## 2017-01-20 NOTE — Patient Care Conference (Signed)
Inpatient RehabilitationTeam Conference and Plan of Care Update Date: 01/20/2017   Time: 2:20 PM    Patient Name: Traci [name]      Medical Record Number: [id number]  Date of Birth: 10/06/1975 Sex: Female         Room/Bed: 4M08C/4M08C-01 Payor Info: Payor: MEDCOST / Plan: MEDCOST / Product Type: *No Product type* /    Admitting Diagnosis: transverse myelilis  Admit Date/Time:  12/29/2016  5:26 PM Admission Comments: No comment available   Primary Diagnosis:  Thoracic myelopathy Principal Problem: Thoracic myelopathy  Patient Active Problem List   Diagnosis Date Noted  . Generalized abdominal pain   . Non-intractable vomiting with nausea   . Hemorrhoids   . Neuropathic pain   . Acute blood loss anemia   . Transaminitis   . Acute lower UTI   . Hyponatremia   . Peroneal DVT (deep venous thrombosis), left ([place])   . Thoracic myelopathy   . Neurogenic bladder   . Neurogenic bowel   . Bilateral leg weakness   . CNS demyelination ([place])   . Myelitis ([place])   . Paresthesia of both lower extremities   . Morbid obesity ([place])   . Spinal stenosis of lumbar region   . DDD (degenerative disc disease), lumbosacral   . Diabetes mellitus type 2 in obese ([place])   . Paraplegia ([place])   . Transverse myelitis ([place]) 12/25/2016  . Steroid-induced hyperglycemia 12/25/2016    Expected Discharge Date: Expected Discharge Date: 01/20/17  Team Members Present: Physician leading conference: Dr. 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 LBM  01/21/17. Toileting schedule q 4 hours with PVR less than 350, incontinent at times  Mod assist with bowel and bladder program  Set hours for timed voids and reinforce up to [facility]   Swallow/Nutrition/ Hydration             ADL's   Supervision to min assist for bathing and dressing supine to circle sitting to EOB.  Min assist for transfers to the drop arm commode with max assist for clothing management and hygiene   min A bathing, LB dressing, toileting, toilet transfers; mod A to shower; mod I UB self care and meal prep from w/c level  selfcare retraining, balance retraining, therapeutic exercise, functional transfers, DME/AE training, discharge planning   Mobility   min assist with level slide board transfers with transition to uneven surfaces. Perforemed level car transfer with simulator. Continued difficulty with pressure reliefs and moving hips laterally with transfers.   Mod I transfers, bed mobility, w/c skills.   Transfers using sliding board level/unlevel surfaces, w/c management and skill, UE strengthening and LE ROM, D/c planning.    Communication             Safety/Cognition/ Behavioral Observations  Pt is min assist with safety and cognition  Min assist with safety and cognition  Pt to be proactive in self care   Pain   pt reports pain 6/10 to lower back, PRN Robaxin, Tramadol, and Tylenol administered for relief  less than 4  Contine plan of care  Skin   No breakdown   continue to monitor skin every shift and prn  Have patient monitor skin with min assist      *See Care Plan and progress notes for long and short-term goals.  Barriers to Discharge: Neurogenic bladder/bowel, obesity, mobility, transfers, DVT    Possible Resolutions to Barriers:  Therapies, optimize bladder/bowel meds, anticoag    Discharge Planning/Teaching Needs:  Pt has no one to provide care at home, intermittent from roommate. Bowel and bladder program still being worked on with Therapist, sports.       Team  Discussion:  Making slow progress with tranfers, still working on bladder and bowel programs. MD reports medically stable and can move on to next venue. Plan has changed to short term NHP. Looking for NH bed  Revisions to Treatment Plan:  NHP   Continued Need for Acute Rehabilitation Level of Care: The patient requires daily medical management by a physician with specialized training in physical medicine and rehabilitation for the following conditions: Daily direction of a multidisciplinary physical rehabilitation program to ensure safe treatment while eliciting the highest outcome that is of practical value to the patient.: Yes Daily medical management of patient stability for increased activity during participation in an intensive rehabilitation regime.: Yes Daily analysis of laboratory values and/or radiology reports with any subsequent need for medication adjustment of medical intervention for : Neurological problems;Other;Urological problems  [name] 01/22/2017, 12:49 PM

## 2017-01-20 NOTE — Progress Notes (Signed)
[place] PHYSICAL MEDICINE & REHABILITATION     PROGRESS NOTE  Subjective/Complaints:  Pt seen laying in bed this AM.  She slept well overnight.  She notes improvement in bladder function, but persistent deficits with bowels.    ROS: Denies  CP, SOB, N/V/D.   Objective: Vital Signs: Blood pressure (!) 125/51, pulse 75, temperature 98.4 F (36.9 C), temperature source Oral, resp. rate 18, height 5' (1.524 m), weight 119 kg (262 lb 6.4 oz), SpO2 98 %. No results found.  Recent Labs  01/19/17 0453  WBC 4.9  HGB 10.6*  HCT 34.1*  PLT 288    Recent Labs  01/19/17 0453  NA 137  K 3.7  CL 102  GLUCOSE 113*  BUN 10  CREATININE 0.58  CALCIUM 9.1   CBG (last 3)   Recent Labs  01/19/17 1714 01/19/17 2046 01/20/17 0644  GLUCAP 102* 113* 163*    Wt Readings from Last 3 Encounters:  12/29/16 119 kg (262 lb 6.4 oz)  12/25/16 131.7 kg (290 lb 6.4 oz)  12/17/16 127.9 kg (282 lb)    Physical Exam:  BP (!) 125/51 (BP Location: Right Wrist)   Pulse 75   Temp 98.4 F (36.9 C) (Oral)   Resp 18   Ht 5' (1.524 m)   Wt 119 kg (262 lb 6.4 oz)   SpO2 98%   BMI 51.25 kg/m  Constitutional: She appears well-developed. No distress. Obese.  HENT: Normocephalicand atraumatic.  Eyes: EOMI. No discharge.  Cardiovascular: RRR without JVD Respiratory: Normal effort. Clear. GI: Soft. Bowel sounds are normal.   Musculoskeletal: She exhibits no edemaor tenderness.  Neurological: She is alertand oriented.  Speech clear.  Follows commands without difficulty.  Motor: B/l UE motor 5/5 prox to distal.  B/l LE: 0/5 HF, KE and ADF/PF (unchanged).  Sensation diminished to LT below inguinal area (stable).  Skin: Skin is warmand dry. She is not diaphoretic.  Psychiatric: She has a normal mood and affect. Her speech is normaland behavior is normal. Judgmentand thought contentnormal. Cognition and memoryare normal.    Assessment/Plan: 1. Functional deficits secondary to  Transverse Myelitis which require 3+ hours per day of interdisciplinary therapy in a comprehensive inpatient rehab setting. Physiatrist is providing close team supervision and 24 hour management of active medical problems listed below. Physiatrist and rehab team continue to assess barriers to discharge/monitor patient progress toward functional and medical goals.  Function:  Bathing Bathing position   Position: Sitting EOB  Bathing parts Body parts bathed by patient: Right arm, Left arm, Chest, Abdomen, Front perineal area, Buttocks, Back Body parts bathed by helper: Back  Bathing assist Assist Level: Touching or steadying assistance(Pt > 75%)      Upper Body Dressing/Undressing Upper body dressing   What is the patient wearing?: Pull over shirt/dress     Pull over shirt/dress - Perfomed by patient: Thread/unthread left sleeve, Thread/unthread right sleeve, Put head through opening, Pull shirt over trunk          Upper body assist Assist Level: Set up   Set up : To obtain clothing/put away  Lower Body Dressing/Undressing Lower body dressing   What is the patient wearing?: Pants     Pants- Performed by patient: Thread/unthread right pants leg, Thread/unthread left pants leg, Pull pants up/down   Non-skid slipper socks- Performed by patient: Don/doff right sock, Don/doff left sock Non-skid slipper socks- Performed by helper: Don/doff left sock, Don/doff right sock   Socks - Performed by helper: Don/doff right  sock, Don/doff left sock Shoes - Performed by patient: Don/doff right shoe, Don/doff left shoe, Fasten right, Fasten left       TED Hose - Performed by patient: Don/doff right TED hose, Don/doff left TED hose TED Hose - Performed by helper: Don/doff right TED hose, Don/doff left TED hose  Lower body assist Assist for lower body dressing: Set up      Toileting Toileting Toileting activity did not occur: No continent bowel/bladder event Toileting steps completed by  patient: Adjust clothing prior to toileting, Performs perineal hygiene, Adjust clothing after toileting Toileting steps completed by helper: Adjust clothing prior to toileting, Adjust clothing after toileting Toileting Assistive Devices: Toilet aid  Toileting assist Assist level: Set up/obtain supplies, Touching or steadying assistance (Pt.75%)   Transfers Chair/bed transfer   Chair/bed transfer method: Lateral scoot Chair/bed transfer assist level: Touching or steadying assistance (Pt > 75%) Chair/bed transfer assistive device: Armrests, Sliding board Mechanical lift: Maximove   Locomotion Ambulation Ambulation activity did not occur: Safety/medical concerns         Wheelchair   Type: Manual Max wheelchair distance: 175 ft  Assist Level: No help, No cues, assistive device, takes more than reasonable amount of time  Cognition Comprehension Comprehension assist level: Follows complex conversation/direction with no assist  Expression Expression assist level: Expresses complex ideas: With extra time/assistive device  Social Interaction Social Interaction assist level: Interacts appropriately with others - No medications needed.  Problem Solving Problem solving assist level: Solves complex problems: Recognizes & self-corrects  Memory Memory assist level: Complete Independence: No helper    Medical Problem List and Plan: 1.  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  DVT: LLE DVT - Xarelto started 3. Pain Management: complains of low back pain  -continue mobic but increase to 15mg  qd scheduled. robaxin, tramadol prn  -needs Kpad---ordered but not delivered  -Paraesthesias controlled on Neurontin tid and Mobic daily.  4. Mood: LCSW to follow for evaluation and support.  5. Neuropsych: This patient is capable of making decisions on her own behalf. 6. Skin/Wound Care: Educate on importance of boosting  and pressure relief measures.  7. Fluids/Electrolytes/Nutrition: Monitor I/O.   BMP within acceptable range on 6/5 8. T2DM: new diagnosis with Hgb A1C-6.9 at admission.   Monitor BS ac/hs.    Educate patient on CM diet.   SSI for now  Controlled 6/6  Cont to monitor 9. Neurogenic bowel:    Adjusting meds further on 6/6 10. Morbid obesity: Educate on appropriate diet. Will require bariatric equipment as well as skin monitoring to prevent breakdown.   11. Neurogenic bladder: foley d/ced with voiding trial  Low dose urecholine started 5/20, d/c'ed   Started on Enalbrex 7.5 on 6/4, improving 12. Acute lower UTI  Ucx with Proteus and E.Coli  Resistant to macrobid, Keflex completed 5/19-5/26 13. Hyponatremia: Resolved  Cont to monitor 14. Transaminitis: Resolved  Cont to monitor 15. ABLA  Hb 10.6 on 6/5  Cont to monitor 16. Hemorrhoids  Anusol ordered 5/24  Educated on scheduled dosage  LOS (Days) 22 A FACE TO FACE EVALUATION WAS PERFORMED  Nikolina Simerson Lorie Phenix 01/20/2017 8:48 AM

## 2017-01-20 NOTE — Progress Notes (Signed)
Occupational Therapy Session Note  Patient Details  Name: Traci [name] MRN: [id number] Date of Birth: Jan 26, 1976  Today's Date: 01/20/2017 OT Individual Time: 4503-8882 OT Individual Time Calculation (min): 27 min    Skilled Therapeutic Interventions/Progress Updates:    Pt completed therapeutic UE exercises with supervision from bed level using medium orange resistance theraband.  She completed 1 set of 20 repetitions for shoulder flexion, shoulder horizontal abduction, elbow extension, and internal/external rotation for BUEs.  Min instructional cueing for technique to complete these exercises.  Pt left in bed at end of session, no complaints of pain noted, call button and phone in reach.    Therapy Documentation Precautions:  Precautions Precautions: Fall Precaution Comments: BLE paraparesis Restrictions Weight Bearing Restrictions: No  Pain: Pain Assessment Pain Assessment: 0-10 Pain Score: 6  Pain Type: Acute pain Pain Location: Abdomen (stomach muscle pain from turning) Pain Orientation: Right;Left Pain Descriptors / Indicators: Aching;Dull Pain Frequency: Intermittent Pain Intervention(s): Medication (See eMAR) ADL: See Function Navigator for Current Functional Status.   Therapy/Group: Individual Therapy  Vijay Durflinger OTR/L 01/20/2017, 4:18 PM

## 2017-01-20 NOTE — Progress Notes (Signed)
Occupational Therapy Session Note  Patient Details  Name: Sindhu Nguyen MRN: [id number] Date of Birth: 1976/08/04  Today's Date: 01/20/2017 OT Individual Time: 1300-1330 OT Individual Time Calculation (min): 30 min    Skilled Therapeutic Interventions/Progress Updates:    1:1 Pt reported ongoing nausea and abdominal discomfort today- RN aware. Pt also reports lower back pain. Pt transferred with steadying A bed to w/c with min A to place slide board. Pt transitioned down to the gym and perform standing in the standing frame for ~20 min to assist with Lower back pain and motility for bowels, LE weight bearing etc. Also kinesotaped pt's lower back at midline for comfort.  Pt returned to EOB via slide board with steadying A and returned to supine with mod A for assistance for bilateral LEs. Heating pad wrapped around towel roll  For better positioning in the small of her back. Pt left to rest in bed at her request.   Therapy Documentation Precautions:  Precautions Precautions: Fall Precaution Comments: BLE paraparesis Restrictions Weight Bearing Restrictions: No Pain: C/o back pain- assisting with positioning and heat and RN aware See Function Navigator for Current Functional Status.   Therapy/Group: Individual Therapy  [name] [facility] 01/20/2017, 3:51 PM

## 2017-01-20 NOTE — Progress Notes (Signed)
Physical Therapy Session Note  Patient Details  Name: Traci [name] MRN: [id number] Date of Birth: 1975-09-12  Today's Date: 01/20/2017 PT Individual Time: 0800-0900 PT Individual Time Calculation (min): 60 min   Short Term Goals: Week 3:  PT Short Term Goal 1 (Week 3): LTG=STG  Skilled Therapeutic Interventions/Progress Updates:   Pt in bed upon arrival, agreeable to PT session. Pt donning TED hose, pants, and shoes in bed with setup assist. Rolling to pull pants up using rails to assist.Transfers: bed>w/c with transfer with min assist. Working in gym on level transfers using sliding board. Increased time on weight shifting for board placement. W/c - propelling 200 ft mod I. Following session pt returned to room with all needs in reach.  Therapy Documentation Precautions:  Precautions Precautions: Fall Precaution Comments: BLE paraparesis Restrictions Weight Bearing Restrictions: No Pain:  7/10 low back, just received meds.  See Function Navigator for Current Functional Status.   Therapy/Group: Individual Therapy  Linard Millers, PT 01/20/2017, 8:12 AM

## 2017-01-20 NOTE — Progress Notes (Signed)
Occupational Therapy Session Note  Patient Details  Name: Traci [name] MRN: [id number] Date of Birth: 1975-10-14  Today's Date: 01/20/2017 OT Individual Time: 3748-2707 OT Individual Time Calculation (min): 71 min    Short Term Goals: Week 3:  OT Short Term Goal 1 (Week 3): Continue working on [employer].   Skilled Therapeutic Interventions/Progress Updates:    Pt completed bathing and dressing during session.  She was able to perform UB bathing sitting in the wheelchair with setup assist.  Min assist to transfer from wheelchair to the bed, with mod assist to transition LEs into the bed as she did not have her leg loops on.  She was able to push down her clothing rolling side to side with supervision and wash peri area.  Max assist for donning new brief in supine.  She then progressed to removing pants, TEDs, and shoes for washing lower legs from circle sitting position at supervision level.  She was able to donn them with supervision as well after washing and drying.  Supervision for return to supine and rolling to pull pants over hips.  Pt left in bed at end of session with call button and phone in reach.  Pt reporting upset stomach this am, which nursing is already aware of.    Therapy Documentation Precautions:  Precautions Precautions: Fall Precaution Comments: BLE paraparesis Restrictions Weight Bearing Restrictions: No  Pain: Pain Assessment Pain Assessment: 0-10 Pain Score: 6  Pain Type: Acute pain Pain Location: Abdomen (stomach muscle pain from turning) Pain Orientation: Right;Left Pain Descriptors / Indicators: Aching;Dull Pain Frequency: Intermittent Pain Intervention(s): Medication (See eMAR) ADL:  See Function Navigator for Current Functional Status.   Therapy/Group: Individual Therapy  Dionisio Aragones OTR/L 01/20/2017, 12:45 PM

## 2017-01-21 ENCOUNTER — Encounter (HOSPITAL_COMMUNITY): Payer: Self-pay

## 2017-01-21 ENCOUNTER — Inpatient Hospital Stay (HOSPITAL_COMMUNITY): Payer: PRIVATE HEALTH INSURANCE

## 2017-01-21 ENCOUNTER — Inpatient Hospital Stay (HOSPITAL_COMMUNITY): Payer: PRIVATE HEALTH INSURANCE | Admitting: Occupational Therapy

## 2017-01-21 ENCOUNTER — Inpatient Hospital Stay (HOSPITAL_COMMUNITY): Payer: PRIVATE HEALTH INSURANCE | Admitting: Physical Therapy

## 2017-01-21 DIAGNOSIS — R1084 Generalized abdominal pain: Secondary | ICD-10-CM | POA: Insufficient documentation

## 2017-01-21 DIAGNOSIS — R112 Nausea with vomiting, unspecified: Secondary | ICD-10-CM | POA: Insufficient documentation

## 2017-01-21 LAB — GLUCOSE, CAPILLARY
GLUCOSE-CAPILLARY: 125 mg/dL — AB (ref 65–99)
GLUCOSE-CAPILLARY: 90 mg/dL (ref 65–99)
Glucose-Capillary: 120 mg/dL — ABNORMAL HIGH (ref 65–99)
Glucose-Capillary: 75 mg/dL (ref 65–99)

## 2017-01-21 MED ORDER — FLEET ENEMA 7-19 GM/118ML RE ENEM
1.0000 | ENEMA | Freq: Every day | RECTAL | Status: DC
  Administered 2017-01-21 – 2017-01-26 (×5): 1 via RECTAL
  Filled 2017-01-21 (×6): qty 1

## 2017-01-21 MED ORDER — FAMOTIDINE 20 MG PO TABS
20.0000 mg | ORAL_TABLET | Freq: Two times a day (BID) | ORAL | Status: DC
  Administered 2017-01-21 – 2017-01-27 (×13): 20 mg via ORAL
  Filled 2017-01-21 (×13): qty 1

## 2017-01-21 MED ORDER — POLYETHYLENE GLYCOL 3350 17 G PO PACK
17.0000 g | PACK | Freq: Two times a day (BID) | ORAL | Status: DC
  Administered 2017-01-21 – 2017-01-25 (×9): 17 g via ORAL
  Filled 2017-01-21 (×9): qty 1

## 2017-01-21 NOTE — Progress Notes (Signed)
Social Work Patient ID: Traci [name], female   DOB: 11-17-1975, 41 y.o.   MRN: [id number]  Awaiting [facility]' prior approval with pt's insurance. Pt aware of plan and process. Will let know once hear From facility.

## 2017-01-21 NOTE — Progress Notes (Signed)
Occupational Therapy Weekly Progress Note  Patient Details  Name: Traci [name] MRN: [id number] Date of Birth: [birth year]  Beginning of progress report period: Jan 14, 2017 End of progress report period: February 20, 2017  Today's Date: 01/21/2017 OT Individual Time: 1415-1500 OT Individual Time Calculation (min): 45 min     Patient continues to demonstrate the following deficits: muscle weakness, impaired timing and sequencing and decreased coordination and decreased sitting balance and decreased balance strategies and therefore will continue to benefit from skilled OT intervention to enhance overall performance with BADL, Vocation and Reduce care partner burden.  Pt continues to make steady progress with OT.  From bed level from supine to long sitting she can complete UB and LB bathing and dressing with overall supervision.  Toilet transfers and wheelchair to mat/bed transfers are at a min assist level.  Max assist for clothing management in sitting with lateral leans side to side for managing clothing when sitting on the 3:1.  Pt still does not have an established bowel program and with transitional movements during therapy both bladder and bowel incontinence continue to be an issue.  This issue along with her need to have assistance for toileting tasks limit her being able to be alone at home.  Overall feel pt cannot discharge home without 24 hour assist, and currently family or friends cannot provide this.  Recommend further rehab at SNF level where pt can continue progressing to reach modified independent level and will have adequate supervision.    Patient progressing toward long term goals..  Continue plan of care.  OT Short Term Goals Week 4:  OT Short Term Goal 1 (Week 4): Continue working on [employer].  Skilled Therapeutic Interventions/Progress Updates:    Pt rolled herself down to the therapy gym with modified independence.  She was able to transfer with the sliding board from  wheelchair to therapy mat with min assist.  Once on the mat worked on transitional movements from sitting to side sitting in order to place clothes pins for simulated clothing management.  Pt was able to come down to forearm on both sides for reaching and pick up clothespins and then transfer to grid on the other side with supervision.  Progressed to using theraband for simulated clothing management with lateral leans side to side to pull over hips and to pull down below hips.  Min assist to manage theraband for simulated clothing.  Pt transferred back to wheelchair from mat with mod assist from therapist.  Pt with mat elevated to height of wheelchair and when attempting to scoot across board without LEs supported on surface, she demonstrated forward sliding off of the board.  Mod assist from therapist needed to stabilize LEs as not to fall off forward.  Returned to room via wheelchair with call button and phone in reach.    Therapy Documentation Precautions:  Precautions Precautions: Fall Precaution Comments: BLE paraparesis Restrictions Weight Bearing Restrictions: No  Pain: Pain Assessment Pain Assessment: 0-10 Pain Score: 5  Pain Type: Chronic pain Pain Location: Back Pain Orientation: Mid;Lower Pain Descriptors / Indicators: Aching Pain Frequency: Intermittent Pain Onset: On-going Pain Intervention(s): Medication (See eMAR) ADL: See Function Navigator for Current Functional Status.   Therapy/Group: Individual Therapy  Chara Marquard OTR/L 01/21/2017, 4:37 PM

## 2017-01-21 NOTE — Progress Notes (Signed)
[place] PHYSICAL MEDICINE & REHABILITATION     PROGRESS NOTE  Subjective/Complaints:  Pt seen layign in bed this AM.  She slept well overnight.  She notes bladder has improved with timed voiding.  She notes bowels have improved with dig stim.  She complains of abdominal discomfort.   ROS: +Abd discomfort, nausea. CP, SOB, Diarrhea.   Objective: Vital Signs: Blood pressure 122/62, pulse 80, temperature 98.4 F (36.9 C), temperature source Oral, resp. rate 20, height 5' (1.524 m), weight 117.9 kg (259 lb 14.4 oz), SpO2 97 %. No results found.  Recent Labs  01/19/17 0453  WBC 4.9  HGB 10.6*  HCT 34.1*  PLT 288    Recent Labs  01/19/17 0453  NA 137  K 3.7  CL 102  GLUCOSE 113*  BUN 10  CREATININE 0.58  CALCIUM 9.1   CBG (last 3)   Recent Labs  01/20/17 1701 01/20/17 2033 01/21/17 0639  GLUCAP 92 129* 120*    Wt Readings from Last 3 Encounters:  01/20/17 117.9 kg (259 lb 14.4 oz)  12/25/16 131.7 kg (290 lb 6.4 oz)  12/17/16 127.9 kg (282 lb)    Physical Exam:  BP 122/62 (BP Location: Left Arm)   Pulse 80   Temp 98.4 F (36.9 C) (Oral)   Resp 20   Ht 5' (1.524 m)   Wt 117.9 kg (259 lb 14.4 oz)   SpO2 97%   BMI 50.76 kg/m  Constitutional: She appears well-developed. No distress. Obese.  HENT: Normocephalicand atraumatic.  Eyes: EOMI. No discharge.  Cardiovascular: RRR without JVD Respiratory: Normal effort. Clear. GI: Soft. Bowel sounds are normal.   Musculoskeletal: She exhibits no edemaor tenderness.  Neurological: She is alertand oriented.  Speech clear.  Follows commands without difficulty.  Motor: B/l UE motor 5/5 prox to distal.  B/l LE: 0/5 HF, KE and ADF/PF (stable).  Sensation diminished to LT below inguinal area (stable).  Skin: Skin is warmand dry. She is not diaphoretic.  Psychiatric: She has a normal mood and affect. Her speech is normaland behavior is normal. Judgmentand thought contentnormal. Cognition and memoryare normal.     Assessment/Plan: 1. 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 Physiatrist is providing close team supervision and 24 hour management of active medical problems listed below. Physiatrist and rehab team continue to assess barriers to discharge/monitor patient progress toward functional and medical goals.  Function:  Bathing Bathing position   Position: Bed  Bathing parts Body parts bathed by patient: Right arm, Left arm, Chest, Abdomen, Front perineal area, Buttocks, Right upper leg, Left upper leg, Right lower leg, Left lower leg, Back Body parts bathed by helper: Back  Bathing assist Assist Level: Supervision or verbal cues      Upper Body Dressing/Undressing Upper body dressing   What is the patient wearing?: Pull over shirt/dress     Pull over shirt/dress - Perfomed by patient: Thread/unthread left sleeve, Thread/unthread right sleeve, Put head through opening, Pull shirt over trunk          Upper body assist Assist Level: Supervision or verbal cues   Set up : To obtain clothing/put away  Lower Body Dressing/Undressing Lower body dressing   What is the patient wearing?: Pants, Ted Hose, Shoes     Pants- Performed by patient: Thread/unthread right pants leg, Thread/unthread left pants leg, Pull pants up/down   Non-skid slipper socks- Performed by patient: Don/doff right sock,  Don/doff left sock Non-skid slipper socks- Performed by helper: Don/doff left sock, Don/doff right sock   Socks - Performed by helper: Don/doff right sock, Don/doff left sock Shoes - Performed by patient: Don/doff right shoe, Don/doff left shoe       TED Hose - Performed by patient: Don/doff right TED hose, Don/doff left TED hose TED Hose - Performed by helper: Don/doff right TED hose, Don/doff left TED hose  Lower body assist Assist for lower body dressing: Supervision or verbal  cues      Toileting Toileting Toileting activity did not occur: No continent bowel/bladder event Toileting steps completed by patient: Adjust clothing prior to toileting, Adjust clothing after toileting Toileting steps completed by helper: Performs perineal hygiene Toileting Assistive Devices: Other (comment) (SB)  Toileting assist Assist level: Touching or steadying assistance (Pt.75%)   Transfers Chair/bed transfer   Chair/bed transfer method: Lateral scoot Chair/bed transfer assist level: Touching or steadying assistance (Pt > 75%) Chair/bed transfer assistive device: Armrests, Sliding board Mechanical lift: Maximove   Locomotion Ambulation Ambulation activity did not occur: Safety/medical concerns         Wheelchair   Type: Manual Max wheelchair distance: 200 ft Assist Level: No help, No cues, assistive device, takes more than reasonable amount of time  Cognition Comprehension Comprehension assist level: Follows complex conversation/direction with no assist  Expression Expression assist level: Expresses complex ideas: With extra time/assistive device  Social Interaction Social Interaction assist level: Interacts appropriately with others - No medications needed.  Problem Solving Problem solving assist level: Solves complex problems: Recognizes & self-corrects  Memory Memory assist level: Complete Independence: No helper    Medical Problem List and Plan: 1.  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  DVT: LLE DVT - Xarelto started 3. Pain Management: complains of low back pain  -continue mobic but increase to 15mg  qd scheduled. robaxin, tramadol prn  -needs Kpad---ordered but not delivered  -Paraesthesias controlled on Neurontin tid and Mobic daily.  4. Mood: LCSW to follow for evaluation and support.  5. Neuropsych: This patient is capable of making decisions on her own behalf. 6.  Skin/Wound Care: Educate on importance of boosting and pressure relief measures.  7. Fluids/Electrolytes/Nutrition: Monitor I/O.   BMP within acceptable range on 6/5 8. T2DM: new diagnosis with Hgb A1C-6.9 at admission.   Monitor BS ac/hs.    Educate patient on CM diet.   SSI for now  Controlled 6/7  Cont to monitor 9. Neurogenic bowel:    Adjusting meds further on 6/6 10. Morbid obesity: Educate on appropriate diet. Will require bariatric equipment as well as skin monitoring to prevent breakdown.   11. Neurogenic bladder: foley d/ced with voiding trial  Low dose urecholine started 5/20, d/c'ed   Started on Enalbrex 7.5 on 6/4, improving 12. Acute lower UTI  Ucx with Proteus and E.Coli  Resistant to macrobid, Keflex completed 5/19-5/26  Repeat Ua/Ucx ordered due to abdominal discomfort 13. Hyponatremia: Resolved  Cont to monitor 14. Transaminitis: Resolved  Cont to monitor 15. ABLA  Hb 10.6 on 6/5  Cont to monitor 16. Hemorrhoids  Anusol ordered 5/24  Educated on scheduled dosage  LOS (Days) 23 A FACE TO FACE EVALUATION WAS PERFORMED  Ankit Lorie Phenix 01/21/2017 8:42 AM

## 2017-01-21 NOTE — Progress Notes (Signed)
Occupational Therapy Session Note  Patient Details  Name: Traci [name] MRN: [id number] Date of Birth: 12-24-1975  Today's Date: 01/21/2017 OT Individual Time: 5726-2035 OT Individual Time Calculation (min): 59 min    Short Term Goals: Week 3:  OT Short Term Goal 1 (Week 3): Continue working on [employer].   Skilled Therapeutic Interventions/Progress Updates:    Pt completed bathing and dressing in bed this session.  Min assist for initial transition from supine to circle sit, with pt again reporting nausea this am.  Once in circle sitting she was able to complete all UB and LB bathing with setup.  She transitioned back to supine rolling side to side with supervision for removing brief and completing peri hygiene with supervision as well.  Therapist fastened brief to simulate underpants this session, with pt transitioning back to circle sitting with supervision this attempt to donn LB clothing over feet.  Supervision for all aspects but then she did require mod assist for pulling brief and pants over hips while rolling. Pt left in bed at end of session with NT in to assist with toilet transfer and toileting.    Therapy Documentation Precautions:  Precautions Precautions: Fall Precaution Comments: BLE paraparesis Restrictions Weight Bearing Restrictions: No  Pain: Pain Assessment Pain Assessment: 0-10 Pain Score: 6  Pain Type: Acute pain Pain Location: Back Pain Orientation: Right Pain Onset: On-going Pain Intervention(s): Medication (See eMAR) ADL: See Function Navigator for Current Functional Status.   Therapy/Group: Individual Therapy  Keerat Denicola OTR/L 01/21/2017, 11:06 AM

## 2017-01-21 NOTE — Progress Notes (Signed)
Occupational Therapy Session Note  Patient Details  Name: Traci [name] MRN: [id number] Date of Birth: [birth year]  Today's Date: 01/21/2017 OT Individual Time: 1530-1600 OT Individual Time Calculation (min): 30 min    Short Term Goals: Week 3:  OT Short Term Goal 1 (Week 3): Continue working on [employer].   Skilled Therapeutic Interventions/Progress Updates:    Upon entering the room, pt seated on drop arm commode. Pt unable to void this session and reports feeling nauseated. Pt performed a series of lateral leans for LB clothing management with assistance needed to fully pull over buttocks secondary to pt feeling unwell. Pt performed slide board transfer from drop arm commode chair to bed with assistance to place B LE's into bed. Pt positioned on L side per her request. Bed alarm activated. Call bell and all needed items within reach.   Therapy Documentation Precautions:  Precautions Precautions: Fall Precaution Comments: BLE paraparesis Restrictions Weight Bearing Restrictions: No General:   Vital Signs: Therapy Vitals Temp: 98.9 F (37.2 C) Temp Source: Oral Pulse Rate: 85 Resp: 20 BP: 129/61 Patient Position (if appropriate): Sitting Oxygen Therapy SpO2: 100 % O2 Device: Not Delivered Pain: Pain Assessment Pain Assessment: 0-10 Pain Score: 5  Pain Type: Chronic pain Pain Location: Back Pain Orientation: Mid;Lower Pain Descriptors / Indicators: Aching Pain Frequency: Intermittent Pain Onset: On-going Pain Intervention(s): Medication (See eMAR)  See Function Navigator for Current Functional Status.   Therapy/Group: Individual Therapy  Gypsy Decant 01/21/2017, 4:06 PM

## 2017-01-21 NOTE — Progress Notes (Signed)
Social Work Patient ID: Traci [name], female   DOB: 1975-10-03, 41 y.o.   MRN: [id number]  Spoke with [facility] who reports still working on obtaining insurance authorization for admission. She hopes to know something by the end of the day. Will contact this worker when she knows. Pt aware of this.

## 2017-01-21 NOTE — Progress Notes (Signed)
Physical Therapy Weekly Progress Note  Patient Details  Name: Traci [name] MRN: [id number] Date of Birth: 08/25/1975  Beginning of progress report period: Jan 14, 2017 End of progress report period: January 21, 2017  Today's Date: 01/21/2017 PT Individual Time: 1135-1205 PT Individual Time Calculation (min): 30 min   Patient has met 0 of 1 short term goals.  Goals were set to equal LTG which were partially met. Pt is continuing to make gradual progress with bed mobility and transfers. Pt is continuing to require min assist with slide board transfers currently but continuing to make gains. D/C planning did change from home to SNF. Pt remains appropriate for ongoing PT services to continue to work on progressing functional mobility as tolerated.   Patient continues to demonstrate the following deficits muscle weakness and muscle paralysis,and decreased sitting balance and decreased postural control and therefore will continue to benefit from skilled PT intervention to increase functional independence with mobility.  Patient progressing toward long term goals..  Continue plan of care.  PT Short Term Goals Week 4:  PT Short Term Goal 1 (Week 4): LTG=STG  Skilled Therapeutic Interventions/Progress Updates:  Cognitive remediation/compensation;Community reintegration;Discharge planning;Functional mobility training;Disease management/prevention;Neuromuscular re-education;Pain management;Patient/family education;Psychosocial support;Therapeutic Activities;Therapeutic Exercise;UE/LE Strength taining/ROM;UE/LE Coordination activities;Wheelchair propulsion/positioning;DME/adaptive equipment instruction;Splinting/orthotics;Stair training;Balance/vestibular training   Session 1: Pt in bed upon arrival, agreeable to PT session. Reports that she has been nauseated today, not feeling too good. Still having back pain as well. Bed Mobility: supervision rolling, supine>sitting Transfers: bed>w/c min assist primarily  with board placement. W/C propulsion X 220 ft mod I for UE strengthening/endurance/function. Manual therapy: sitting in trunk flexion with UE/trunk support, STM/trigger pt release performed at lumbar paraspinal/quadratus region. Pt transported back to room after session with all needs in reach. Pt reports decreased pain after session. Lumbar support added in sitting.  Session 2: PT up in w/c upon arrival, reports not feeling very well today. Requesting to minimize transfers due to abdominal pain. Pt propelling w/c 175' X2, 200', 100' on flat/level surface. Pt requesting rest breaks due to fatigue and not feeling well. Working on wheelie transitions for uneven surfaces or small thresholds. Working coordination of UE/trunk to get casters off floor. Requiring physical assist for tipping. Following session, pt returned to room, up in w/c with all needs in reach.     Therapy Documentation Precautions:  Precautions Precautions: Fall Precaution Comments: BLE paraparesis Restrictions Weight Bearing Restrictions: No   Pain: Reports having abdominal pain, nursing aware and discussing with PA-C.    See Function Navigator for Current Functional Status.  Therapy/Group: Individual Therapy  Benjamin Zaino, PT 01/21/2017, 7:45 AM   

## 2017-01-21 NOTE — Progress Notes (Signed)
Patient having increased GI symptoms in the past couple of days. Question due to constipation v/s GERD due to increase in Mobic. Will check KUB --SSE if increase in stool burden. Will add Pepcid for GI prophylaxis.

## 2017-01-22 ENCOUNTER — Inpatient Hospital Stay (HOSPITAL_COMMUNITY): Payer: PRIVATE HEALTH INSURANCE | Admitting: Physical Therapy

## 2017-01-22 ENCOUNTER — Inpatient Hospital Stay (HOSPITAL_COMMUNITY): Payer: PRIVATE HEALTH INSURANCE

## 2017-01-22 LAB — URINALYSIS, COMPLETE (UACMP) WITH MICROSCOPIC
Bilirubin Urine: NEGATIVE
Glucose, UA: NEGATIVE mg/dL
KETONES UR: NEGATIVE mg/dL
Nitrite: POSITIVE — AB
PROTEIN: 100 mg/dL — AB
SQUAMOUS EPITHELIAL / LPF: NONE SEEN
Specific Gravity, Urine: 1.012 (ref 1.005–1.030)
pH: 7 (ref 5.0–8.0)

## 2017-01-22 LAB — GLUCOSE, CAPILLARY
GLUCOSE-CAPILLARY: 133 mg/dL — AB (ref 65–99)
GLUCOSE-CAPILLARY: 94 mg/dL (ref 65–99)
GLUCOSE-CAPILLARY: 92 mg/dL (ref 65–99)
Glucose-Capillary: 77 mg/dL (ref 65–99)

## 2017-01-22 MED ORDER — FLEET ENEMA 7-19 GM/118ML RE ENEM
1.0000 | ENEMA | Freq: Once | RECTAL | Status: AC
  Administered 2017-01-22: 1 via RECTAL
  Filled 2017-01-22: qty 1

## 2017-01-22 MED ORDER — SODIUM CHLORIDE 0.9 % IV SOLN
INTRAVENOUS | Status: DC
  Administered 2017-01-22 – 2017-01-23 (×2): via INTRAVENOUS
  Administered 2017-01-23: 1000 mL via INTRAVENOUS

## 2017-01-22 MED ORDER — CIPROFLOXACIN HCL 500 MG PO TABS
500.0000 mg | ORAL_TABLET | Freq: Two times a day (BID) | ORAL | Status: DC
  Administered 2017-01-22 – 2017-01-23 (×3): 500 mg via ORAL
  Filled 2017-01-22 (×5): qty 1

## 2017-01-22 MED ORDER — SENNOSIDES-DOCUSATE SODIUM 8.6-50 MG PO TABS
2.0000 | ORAL_TABLET | Freq: Two times a day (BID) | ORAL | Status: DC
  Administered 2017-01-22 – 2017-01-26 (×8): 2 via ORAL
  Filled 2017-01-22 (×8): qty 2

## 2017-01-22 MED ORDER — ENSURE ENLIVE PO LIQD
237.0000 mL | Freq: Three times a day (TID) | ORAL | Status: DC
  Administered 2017-01-23 (×3): 237 mL via ORAL

## 2017-01-22 MED ORDER — CIPROFLOXACIN HCL 500 MG PO TABS
500.0000 mg | ORAL_TABLET | Freq: Once | ORAL | Status: AC | PRN
  Administered 2017-01-22: 500 mg via ORAL

## 2017-01-22 MED ORDER — MAGNESIUM HYDROXIDE 400 MG/5ML PO SUSP
30.0000 mL | Freq: Once | ORAL | Status: DC
  Filled 2017-01-22 (×3): qty 30

## 2017-01-22 NOTE — Progress Notes (Signed)
Physical Therapy Session Note  Patient Details  Name: Traci [name] MRN: [id number] Date of Birth: 1976-08-07  Today's Date: 01/22/2017 PT Individual Time: 0800-0916, 5277-8242 PT Individual Time Calculation (min): 76 min, 60 min  Short Term Goals: Week 3:  PT Short Term Goal 1 (Week 3): LTG=STG PT Short Term Goal 1 - Progress (Week 3): Partly met  Skilled Therapeutic Interventions/Progress Updates:    Session 1: PT in bed upon arrival, agreeable to PT session. States that she is not feeling well still, vomited X2 last night and had the chills. Doing a little better but still not great. BP 102/48 supine. Long sitting in bed, donning TED hose and pants, supine rolling to pull pants up. Assist with thigh straps for leg loops. Slide board bed>w/c with min assist for board placement and supervision with slide. Propelling w/c room<>gym with rest breaks as needed. Exercise: seated grade 2 band; rows, biceps curls, triceps extension  Bilateral 1X10, chair pressups X4 (max 6 second hold). Seated balance activity with ball toss at variable heights, distances. Following session, pt returned to room, up in w/c with all needs in reach.  Session 2:  Pt in bed upon arrival, agreeable to PT session but still not feeling well. Pt noted to be incontinent of stool, requests nursing to change. Session initiation delayed. Upon return, pt agreeable to reattempt mobility. Rolling bilaterally for donning pants and moving bed pads. Supervision supine>sitting to Rt side using rail (pt declining use of loops). Bed<>w/c X2 assist with board placement, min assist provided with LE management. Pt propelling w/c in halls, requiring rest breaks due to fatigue. Standing performed in standing frame with bilateral UE tasks to engage trunk. Following session, pt returned to room with all needs in reach.  Therapy Documentation Precautions:  Precautions Precautions: Fall Precaution Comments: BLE paraparesis Restrictions Weight  Bearing Restrictions: No   Pain: 4/10 - headache  See Function Navigator for Current Functional Status.   Therapy/Group: Individual Therapy  Linard Millers, PT 01/22/2017, 8:05 AM

## 2017-01-22 NOTE — Progress Notes (Signed)
Social Work Patient ID: Traci [name], female   DOB: [birth year], 41 y.o.   MRN: [id number]  Spoke with Pam-PA who reports ot is not medically stable for discharge today. Will need to do GI work up and check UA and  Work on her bowels. Have contacted [facility] who is still working on approval still. Hopefully will know by end of today. Spoke with [employer] for insurance to make her aware of the medical issues and hopeful plan to go on Monday. She will await Ritta Slot' call.

## 2017-01-22 NOTE — Progress Notes (Signed)
Occupational Therapy Session Note  Patient Details  Name: Traci [name] MRN: [id number] Date of Birth: [birth year]  Today's Date: 01/22/2017 OT Individual Time: 1030-1130 OT Individual Time Calculation (min): 60 min    Short Term Goals: Week 1:  OT Short Term Goal 1 (Week 1): Pt will complete LB bathing sit to supine with mod assist.  OT Short Term Goal 1 - Progress (Week 1): Met OT Short Term Goal 2 (Week 1): Pt will donn pullup shorts/pants wiith mod assist in supine position rolling. OT Short Term Goal 2 - Progress (Week 1): Met OT Short Term Goal 3 (Week 1): Pt will complete sliding board transfer to the drop arm commode with max assist.   OT Short Term Goal 3 - Progress (Week 1): Met OT Short Term Goal 4 (Week 1): Pt complete toilet hygiene and clothing with max assist using lateral leans in sitting.  OT Short Term Goal 4 - Progress (Week 1): Not met  Skilled Therapeutic Interventions/Progress Updates:    1;1. No pain reported. Pt reporting fatigued. Received pt seated on bed pant with increased time to void bowel. Pt able to complete posterior hygiene and clothing management in circle sitting to thread pants/rolling to advance pants past hips after BM with supervision. Pt places slide board for transfers EOB<>BSC to practice clothing management on BSC with significantly increased time. Pt require A to manage pants up hips 2/2 to fatigue. Exited session with pt semi reclined in bed with call light in reach and all needs met.   Therapy Documentation Precautions:  Precautions Precautions: Fall Precaution Comments: BLE paraparesis Restrictions Weight Bearing Restrictions: No  See Function Navigator for Current Functional Status.   Therapy/Group: Individual Therapy  Tonny Branch 01/22/2017, 12:18 PM

## 2017-01-22 NOTE — Progress Notes (Signed)
Fleets administered to pt.  No results at this time.  Pt in bed, SR up x 3, pt abel to make needs known, call bell in reach.

## 2017-01-22 NOTE — Progress Notes (Signed)
[place] PHYSICAL MEDICINE & REHABILITATION     PROGRESS NOTE  Subjective/Complaints:  Pt seen laying in bed this AM.  She slept fairly overnight.  She states she felt sick and just felt bad overnight with nausea and vomitting, but is starting to feel better this AM.    ROS: Denies CP, SOB, Diarrhea.   Objective: Vital Signs: Blood pressure (!) 96/41, pulse (!) 102, temperature 99.4 F (37.4 C), temperature source Oral, resp. rate 20, height 5' (1.524 m), weight 117.9 kg (259 lb 14.4 oz), SpO2 96 %. Dg Abd 1 View  Result Date: 01/21/2017 CLINICAL DATA:  Constipation. EXAM: ABDOMEN - 1 VIEW COMPARISON:  None. FINDINGS: Moderate fecal loading throughout the colon. Calcifications in the left side of the pelvis may simply represent phleboliths but are nonspecific. No bowel obstruction. No other acute abnormalities. IMPRESSION: Moderate fecal loading in the colon. Electronically Signed   By: Dorise Bullion III M.D   On: 01/21/2017 17:06   No results for input(s): WBC, HGB, HCT, PLT in the last 72 hours. No results for input(s): NA, K, CL, GLUCOSE, BUN, CREATININE, CALCIUM in the last 72 hours.  Invalid input(s): CO CBG (last 3)   Recent Labs  01/21/17 1640 01/21/17 2019 01/22/17 0614  GLUCAP 90 75 133*    Wt Readings from Last 3 Encounters:  01/20/17 117.9 kg (259 lb 14.4 oz)  12/25/16 131.7 kg (290 lb 6.4 oz)  12/17/16 127.9 kg (282 lb)    Physical Exam:  BP (!) 96/41 (BP Location: Right Arm)   Pulse (!) 102   Temp 99.4 F (37.4 C) (Oral)   Resp 20   Ht 5' (1.524 m)   Wt 117.9 kg (259 lb 14.4 oz)   SpO2 96%   BMI 50.76 kg/m  Constitutional: She appears well-developed. No distress. Obese.  HENT: Normocephalicand atraumatic.  Eyes: EOMI. No discharge.  Cardiovascular: RRR without JVD Respiratory: Normal effort. Clear. GI: Soft. Bowel sounds are normal.   Musculoskeletal: She exhibits no edemaor tenderness.  Neurological: She is alertand oriented.  Speech clear.   Follows commands without difficulty.  Motor: B/l UE motor 5/5 prox to distal.  B/l LE: 0/5 HF, KE and ADF/PF (unchanged).  Sensation diminished to LT below inguinal area (stable).  Skin: Skin is warmand dry. She is not diaphoretic.  Psychiatric: She has a normal mood and affect. Her speech is normaland behavior is normal. Judgmentand thought contentnormal. Cognition and memoryare normal.    Assessment/Plan: 1. 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 Physiatrist is providing close team supervision and 24 hour management of active medical problems listed below. Physiatrist and rehab team continue to assess barriers to discharge/monitor patient progress toward functional and medical goals.  Function:  Bathing Bathing position   Position: Bed  Bathing parts Body parts bathed by patient: Right arm, Left arm, Chest, Abdomen, Front perineal area, Buttocks, Right upper leg, Left upper leg, Right lower leg, Left lower leg, Back Body parts bathed by helper: Back  Bathing assist Assist Level: Supervision or verbal cues      Upper Body Dressing/Undressing Upper body dressing   What is the patient wearing?: Pull over shirt/dress     Pull over shirt/dress - Perfomed by patient: Thread/unthread left sleeve, Thread/unthread right sleeve, Put head through opening, Pull shirt over trunk          Upper body assist Assist Level: Supervision or verbal cues  Set up : To obtain clothing/put away  Lower Body Dressing/Undressing Lower body dressing   What is the patient wearing?: Pants, Ted Hose, Shoes     Pants- Performed by patient: Thread/unthread right pants leg, Thread/unthread left pants leg Pants- Performed by helper: Pull pants up/down Non-skid slipper socks- Performed by patient: Don/doff right sock, Don/doff left sock Non-skid slipper socks- Performed by helper:  Don/doff left sock, Don/doff right sock   Socks - Performed by helper: Don/doff right sock, Don/doff left sock Shoes - Performed by patient: Don/doff right shoe, Don/doff left shoe       TED Hose - Performed by patient: Don/doff right TED hose, Don/doff left TED hose TED Hose - Performed by helper: Don/doff right TED hose, Don/doff left TED hose  Lower body assist Assist for lower body dressing: Supervision or verbal cues      Toileting Toileting Toileting activity did not occur: No continent bowel/bladder event Toileting steps completed by patient: Adjust clothing prior to toileting Toileting steps completed by helper: Performs perineal hygiene, Adjust clothing after toileting Toileting Assistive Devices: Other (comment) (SB)  Toileting assist Assist level:  (max A)   Transfers Chair/bed transfer   Chair/bed transfer method: Lateral scoot Chair/bed transfer assist level: Touching or steadying assistance (Pt > 75%) Chair/bed transfer assistive device: Armrests, Sliding board Mechanical lift: Maximove   Locomotion Ambulation Ambulation activity did not occur: Safety/medical concerns         Wheelchair   Type: Manual Max wheelchair distance: 220 ft Assist Level: No help, No cues, assistive device, takes more than reasonable amount of time  Cognition Comprehension Comprehension assist level: Follows complex conversation/direction with no assist  Expression Expression assist level: Expresses complex ideas: With no assist  Social Interaction Social Interaction assist level: Interacts appropriately with others - No medications needed.  Problem Solving Problem solving assist level: Solves complex problems: Recognizes & self-corrects  Memory Memory assist level: Complete Independence: No helper    Medical Problem List and Plan: 1.  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  DVT: LLE DVT -  Xarelto started 3. Pain Management: complains of low back pain  -continue mobic but increase to 15mg  qd scheduled. robaxin, tramadol prn  -needs Kpad---ordered but not delivered  -Paraesthesias controlled on Neurontin tid and Mobic daily.  4. Mood: LCSW to follow for evaluation and support.  5. Neuropsych: This patient is capable of making decisions on her own behalf. 6. Skin/Wound Care: Educate on importance of boosting and pressure relief measures.  7. Fluids/Electrolytes/Nutrition: Monitor I/O.   BMP within acceptable range on 6/5 8. T2DM: new diagnosis with Hgb A1C-6.9 at admission.   Monitor BS ac/hs.    Educate patient on CM diet.   SSI for now  Labile 6/8  Cont to monitor 9. Neurogenic bowel:    Adjusting meds further on 6/6  KUB reviewed showing stool burned - will increase reg x1 10. Morbid obesity: Educate on appropriate diet. Will require bariatric equipment as well as skin monitoring to prevent breakdown.   11. Neurogenic bladder: foley d/ced with voiding trial  Low dose urecholine started 5/20, d/c'ed   Started on Enalbrex 7.5 on 6/4, improving 12. Acute lower UTI  Ucx with Proteus and E.Coli  Resistant to macrobid, Keflex completed 5/19-5/26  Repeat Ucx pending, spoke to nursing regarding UA 13. Hyponatremia: Resolved  Cont to monitor 14. Transaminitis: Resolved  Cont to monitor 15. ABLA  Hb 10.6 on 6/5  Cont to monitor 16. Hemorrhoids  Anusol ordered 5/24  Educated on scheduled dosage  LOS (Days) 24 A FACE TO FACE EVALUATION WAS PERFORMED  Ankit Lorie Phenix 01/22/2017 8:19 AM

## 2017-01-22 NOTE — Plan of Care (Signed)
Problem: SCI BLADDER ELIMINATION Goal: RH STG MANAGE BLADDER WITH MEDICATION WITH ASSISTANCE STG Manage Bladder With Medication With [employer].   Outcome: Not Applicable Date Met: 38/18/29 Pt no longer on bladder medications

## 2017-01-23 ENCOUNTER — Inpatient Hospital Stay (HOSPITAL_COMMUNITY): Payer: PRIVATE HEALTH INSURANCE

## 2017-01-23 DIAGNOSIS — R509 Fever, unspecified: Secondary | ICD-10-CM | POA: Insufficient documentation

## 2017-01-23 DIAGNOSIS — R5081 Fever presenting with conditions classified elsewhere: Secondary | ICD-10-CM

## 2017-01-23 LAB — CBC WITH DIFFERENTIAL/PLATELET
BASOS PCT: 0 %
Basophils Absolute: 0 10*3/uL (ref 0.0–0.1)
EOS ABS: 0 10*3/uL (ref 0.0–0.7)
Eosinophils Relative: 0 %
HCT: 31.7 % — ABNORMAL LOW (ref 36.0–46.0)
HEMOGLOBIN: 9.9 g/dL — AB (ref 12.0–15.0)
Lymphocytes Relative: 19 %
Lymphs Abs: 1.5 10*3/uL (ref 0.7–4.0)
MCH: 25.3 pg — ABNORMAL LOW (ref 26.0–34.0)
MCHC: 31.2 g/dL (ref 30.0–36.0)
MCV: 80.9 fL (ref 78.0–100.0)
MONO ABS: 1 10*3/uL (ref 0.1–1.0)
MONOS PCT: 13 %
NEUTROS PCT: 68 %
Neutro Abs: 5.1 10*3/uL (ref 1.7–7.7)
Platelets: 222 10*3/uL (ref 150–400)
RBC: 3.92 MIL/uL (ref 3.87–5.11)
RDW: 15.8 % — AB (ref 11.5–15.5)
WBC: 7.6 10*3/uL (ref 4.0–10.5)

## 2017-01-23 LAB — GLUCOSE, CAPILLARY
GLUCOSE-CAPILLARY: 107 mg/dL — AB (ref 65–99)
GLUCOSE-CAPILLARY: 186 mg/dL — AB (ref 65–99)
Glucose-Capillary: 109 mg/dL — ABNORMAL HIGH (ref 65–99)
Glucose-Capillary: 108 mg/dL — ABNORMAL HIGH (ref 65–99)

## 2017-01-23 LAB — BASIC METABOLIC PANEL
Anion gap: 8 (ref 5–15)
BUN: 11 mg/dL (ref 6–20)
CALCIUM: 8.8 mg/dL — AB (ref 8.9–10.3)
CO2: 25 mmol/L (ref 22–32)
CREATININE: 0.95 mg/dL (ref 0.44–1.00)
Chloride: 102 mmol/L (ref 101–111)
Glucose, Bld: 125 mg/dL — ABNORMAL HIGH (ref 65–99)
Potassium: 3.9 mmol/L (ref 3.5–5.1)
Sodium: 135 mmol/L (ref 135–145)

## 2017-01-23 MED ORDER — DEXTROSE 5 % IV SOLN
1.0000 g | Freq: Every day | INTRAVENOUS | Status: DC
  Administered 2017-01-23: 1 g via INTRAVENOUS
  Filled 2017-01-23: qty 10

## 2017-01-23 NOTE — Progress Notes (Signed)
[facility] PHYSICAL MEDICINE & REHABILITATION     PROGRESS NOTE  Subjective/Complaints:  Pt seen laying in bed this AM.  She slept better overnight, but had a fever.  She has a fever this AM as well, but states she is feeling better.   ROS: Denies CP, SOB, N/V/Diarrhea.   Objective: Vital Signs: Blood pressure 121/61, pulse 97, temperature (!) 101.1 F (38.4 C), temperature source Oral, resp. rate 18, height 5' (1.524 m), weight 117.9 kg (259 lb 14.4 oz), SpO2 97 %. Dg Abd 1 View  Result Date: 01/21/2017 CLINICAL DATA:  Constipation. EXAM: ABDOMEN - 1 VIEW COMPARISON:  None. FINDINGS: Moderate fecal loading throughout the colon. Calcifications in the left side of the pelvis may simply represent phleboliths but are nonspecific. No bowel obstruction. No other acute abnormalities. IMPRESSION: Moderate fecal loading in the colon. Electronically Signed   By: Dorise Bullion III M.D   On: 01/21/2017 17:06    Recent Labs  01/23/17 0432  WBC 7.6  HGB 9.9*  HCT 31.7*  PLT 222    Recent Labs  01/23/17 0432  NA 135  K 3.9  CL 102  GLUCOSE 125*  BUN 11  CREATININE 0.95  CALCIUM 8.8*   CBG (last 3)   Recent Labs  01/22/17 1629 01/22/17 2123 01/23/17 0728  GLUCAP 77 92 107*    Wt Readings from Last 3 Encounters:  01/20/17 117.9 kg (259 lb 14.4 oz)  12/25/16 131.7 kg (290 lb 6.4 oz)  12/17/16 127.9 kg (282 lb)    Physical Exam:  BP 121/61 (BP Location: Right Arm)   Pulse 97   Temp (!) 101.1 F (38.4 C) (Oral)   Resp 18   Ht 5' (1.524 m)   Wt 117.9 kg (259 lb 14.4 oz)   SpO2 97%   BMI 50.76 kg/m  Constitutional: She appears well-developed. No distress. Obese.  HENT: Normocephalicand atraumatic.  Eyes: EOMI. No discharge.  Cardiovascular: RRR. No JVD Respiratory: Normal effort. Clear. GI: Soft. Bowel sounds are normal.   Musculoskeletal: She exhibits no edemaor tenderness.  Neurological: She is alertand oriented.  Speech clear.  Follows commands without  difficulty.  Motor: B/l UE motor 5/5 prox to distal.  B/l LE: 0/5 HF, KE and ADF/PF (unchanged).  Sensation diminished to LT below inguinal area (unchanged).  Skin: Skin is warmand dry. She is not diaphoretic.  Psychiatric: She has a normal mood and affect. Her speech is normaland behavior is normal. Judgmentand thought contentnormal. Cognition and memoryare normal.    Assessment/Plan: 1. 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 Physiatrist is providing close team supervision and 24 hour management of active medical problems listed below. Physiatrist and rehab team continue to assess barriers to discharge/monitor patient progress toward functional and medical goals.  Function:  Bathing Bathing position   Position: Bed  Bathing parts Body parts bathed by patient: Right arm, Left arm, Chest, Abdomen, Front perineal area, Buttocks, Right upper leg, Left upper leg, Right lower leg, Left lower leg, Back Body parts bathed by helper: Back  Bathing assist Assist Level: Supervision or verbal cues      Upper Body Dressing/Undressing Upper body dressing   What is the patient wearing?: Pull over shirt/dress     Pull over shirt/dress - Perfomed by patient: Thread/unthread left sleeve, Thread/unthread right sleeve, Put head through opening, Pull shirt over trunk          Upper  body assist Assist Level: Supervision or verbal cues   Set up : To obtain clothing/put away  Lower Body Dressing/Undressing Lower body dressing   What is the patient wearing?: [employer], Socks, Pants     Pants- Performed by patient: Thread/unthread right pants leg, Thread/unthread left pants leg Pants- Performed by helper: Pull pants up/down Non-skid slipper socks- Performed by patient: Don/doff right sock, Don/doff left sock Non-skid slipper socks- Performed by helper: Don/doff left sock,  Don/doff right sock   Socks - Performed by helper: Don/doff right sock, Don/doff left sock Shoes - Performed by patient: Don/doff right shoe, Don/doff left shoe       TED Hose - Performed by patient: Don/doff right TED hose, Don/doff left TED hose TED Hose - Performed by helper: Don/doff right TED hose, Don/doff left TED hose  Lower body assist Assist for lower body dressing: Touching or steadying assistance (Pt > 75%)      Toileting Toileting Toileting activity did not occur: No continent bowel/bladder event Toileting steps completed by patient: Adjust clothing prior to toileting, Adjust clothing after toileting Toileting steps completed by helper: Performs perineal hygiene Toileting Assistive Devices: Other (comment) (SB)  Toileting assist Assist level:  (max A)   Transfers Chair/bed transfer   Chair/bed transfer method: Lateral scoot Chair/bed transfer assist level: Touching or steadying assistance (Pt > 75%) Chair/bed transfer assistive device: Armrests, Sliding board Mechanical lift: Maximove   Locomotion Ambulation Ambulation activity did not occur: Safety/medical concerns         Wheelchair   Type: Manual Max wheelchair distance: 150 ft Assist Level: No help, No cues, assistive device, takes more than reasonable amount of time  Cognition Comprehension Comprehension assist level: Follows complex conversation/direction with no assist  Expression Expression assist level: Expresses complex ideas: With extra time/assistive device  Social Interaction Social Interaction assist level: Interacts appropriately with others - No medications needed.  Problem Solving Problem solving assist level: Solves complex problems: Recognizes & self-corrects  Memory Memory assist level: Complete Independence: No helper    Medical Problem List and Plan: 1.  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  DVT:  LLE DVT - Xarelto started 3. Pain Management: complains of low back pain  -continue mobic but increase to 15mg  qd scheduled. robaxin, tramadol prn  -needs Kpad---ordered but not delivered  -Paraesthesias controlled on Neurontin tid and Mobic daily.  4. Mood: LCSW to follow for evaluation and support.  5. Neuropsych: This patient is capable of making decisions on her own behalf. 6. Skin/Wound Care: Educate on importance of boosting and pressure relief measures.  7. Fluids/Electrolytes/Nutrition: Monitor I/O.   BMP within acceptable range on 6/9 8. T2DM: new diagnosis with Hgb A1C-6.9 at admission.   Monitor BS ac/hs.    Educate patient on CM diet.   SSI for now  Controlled 6/9  Cont to monitor 9. Neurogenic bowel:    Adjusting meds further on 6/6  KUB reviewed showing stool burned - will increase reg x1 - pt refused 10. Morbid obesity: Educate on appropriate diet. Will require bariatric equipment as well as skin monitoring to prevent breakdown.   11. Neurogenic bladder: foley d/ced with voiding trial  Low dose urecholine started 5/20, d/c'ed   Started on Enalbrex 7.5 on 6/4, improving 12. Acute lower UTI  Ucx with Proteus and E.Coli  Resistant to macrobid, Keflex completed 5/19-5/26  UA+, Ucx pending  Empiric Cipro started 6/8  13. Hyponatremia: Resolved  Cont to monitor 14. Transaminitis: Resolved  Cont to monitor 15. ABLA  Hb 9.9 on 6/9  Cont to monitor 16. Hemorrhoids  Anusol ordered 5/24  Educated on scheduled dosage 17. Febrile  See #12  Blood cultures pending  Will consider broad spectrum abx if symptoms/fevers persist  LOS (Days) 25 A FACE TO FACE EVALUATION WAS PERFORMED  Locklan Canoy Lorie Phenix 01/23/2017 8:22 AM

## 2017-01-23 NOTE — Progress Notes (Signed)
Occupational Therapy Session Note  Patient Details  Name: Lexie Koehl MRN: [id number] Date of Birth: [birth year]  Today's Date: 01/23/2017 OT Individual Time: 5436-0677 OT Individual Time Calculation (min): 58 min    Short Term Goals: Week 4:  OT Short Term Goal 1 (Week 4): Continue working on [employer].  Skilled Therapeutic Interventions/Progress Updates:    1;1. Pt complain of headache. RN aware. Supine>sidelying>EOB with supervision and increased time 2/2 to fatigue. BP assessed and within normal limits. Pt bathes with set up using long handled sponge to wash BLE and dons hospital gown 2/2 to continuous IV antibiotics. Pt uses sock aide with VC for technique to don socks. Pt grooms seated EOB. Pt declining out of bed activity today 2/2 to fatigue and head ache. While sitting EOB to work on trunk control and BUE strength/endurance required for ADLs/functional transfers, pt completes 60 passes with basket ball (ches, bounce and overhead pass) with 2# wrist weights. Exited session with RN administering medication and pt seated EOB.   Therapy Documentation Precautions:  Precautions Precautions: Fall Precaution Comments: BLE paraparesis Restrictions Weight Bearing Restrictions: No  See Function Navigator for Current Functional Status.   Therapy/Group: Individual Therapy  Tonny Branch 01/23/2017, 8:51 AM

## 2017-01-24 ENCOUNTER — Inpatient Hospital Stay (HOSPITAL_COMMUNITY): Payer: PRIVATE HEALTH INSURANCE

## 2017-01-24 DIAGNOSIS — A498 Other bacterial infections of unspecified site: Secondary | ICD-10-CM

## 2017-01-24 LAB — URINE CULTURE

## 2017-01-24 LAB — GLUCOSE, CAPILLARY
GLUCOSE-CAPILLARY: 110 mg/dL — AB (ref 65–99)
Glucose-Capillary: 143 mg/dL — ABNORMAL HIGH (ref 65–99)
Glucose-Capillary: 97 mg/dL (ref 65–99)
Glucose-Capillary: 107 mg/dL — ABNORMAL HIGH (ref 65–99)

## 2017-01-24 MED ORDER — GLUCERNA SHAKE PO LIQD
237.0000 mL | Freq: Three times a day (TID) | ORAL | Status: DC
  Administered 2017-01-24 – 2017-01-27 (×9): 237 mL via ORAL

## 2017-01-24 MED ORDER — CIPROFLOXACIN HCL 500 MG PO TABS
500.0000 mg | ORAL_TABLET | Freq: Two times a day (BID) | ORAL | Status: DC
  Administered 2017-01-24 – 2017-01-27 (×7): 500 mg via ORAL
  Filled 2017-01-24 (×6): qty 1

## 2017-01-24 MED ORDER — ACETAMINOPHEN 325 MG PO TABS: 650.0000 mg | ORAL_TABLET | Freq: Four times a day (QID) | ORAL | Status: DC | PRN

## 2017-01-24 NOTE — Progress Notes (Signed)
[place] PHYSICAL MEDICINE & REHABILITATION     PROGRESS NOTE  Subjective/Complaints:  Pt seen laying in bed this AM.  She stated she did not have any fevers/chills overnight.  She no longer has nausea.  She has started retaining urine again per nursing. Her fluid intake has also improved.   ROS: Denies CP, SOB, N/V/Diarrhea.   Objective: Vital Signs: Blood pressure (!) 124/52, pulse 84, temperature 99.1 F (37.3 C), temperature source Oral, resp. rate 18, height 5' (1.524 m), weight 117.9 kg (259 lb 14.4 oz), SpO2 98 %. No results found.  Recent Labs  01/23/17 0432  WBC 7.6  HGB 9.9*  HCT 31.7*  PLT 222    Recent Labs  01/23/17 0432  NA 135  K 3.9  CL 102  GLUCOSE 125*  BUN 11  CREATININE 0.95  CALCIUM 8.8*   CBG (last 3)   Recent Labs  01/23/17 1642 01/23/17 2110 01/24/17 0648  GLUCAP 108* 186* 143*    Wt Readings from Last 3 Encounters:  01/20/17 117.9 kg (259 lb 14.4 oz)  12/25/16 131.7 kg (290 lb 6.4 oz)  12/17/16 127.9 kg (282 lb)    Physical Exam:  BP (!) 124/52 (BP Location: Right Arm)   Pulse 84   Temp 99.1 F (37.3 C) (Oral)   Resp 18   Ht 5' (1.524 m)   Wt 117.9 kg (259 lb 14.4 oz)   SpO2 98%   BMI 50.76 kg/m  Constitutional: She appears well-developed. No distress. Obese.  HENT: Normocephalicand atraumatic.  Eyes: EOMI. No discharge.  Cardiovascular: RRR. No JVD Respiratory: Normal effort. Clear. GI: Soft. Bowel sounds are normal.   Musculoskeletal: She exhibits no edemaor tenderness.  Neurological: She is alertand oriented.  Speech clear.  Follows commands without difficulty.  Motor: B/l UE motor 5/5 prox to distal.  B/l LE: 0/5 HF, KE and ADF/PF (stable).  Sensation diminished to LT below inguinal area (unchanged).  Skin: Skin is warmand dry. She is not diaphoretic.  Psychiatric: She has a normal mood and affect. Her speech is normaland behavior is normal. Judgmentand thought contentnormal. Cognition and memoryare  normal.    Assessment/Plan: 1. 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 Physiatrist is providing close team supervision and 24 hour management of active medical problems listed below. Physiatrist and rehab team continue to assess barriers to discharge/monitor patient progress toward functional and medical goals.  Function:  Bathing Bathing position   Position: Bed  Bathing parts Body parts bathed by patient: Right arm, Left arm, Chest, Abdomen, Front perineal area, Buttocks, Right upper leg, Left upper leg, Right lower leg, Left lower leg, Back Body parts bathed by helper: Back  Bathing assist Assist Level: Supervision or verbal cues      Upper Body Dressing/Undressing Upper body dressing   What is the patient wearing?: Pull over shirt/dress     Pull over shirt/dress - Perfomed by patient: Thread/unthread left sleeve, Thread/unthread right sleeve, Put head through opening, Pull shirt over trunk          Upper body assist Assist Level: Supervision or verbal cues   Set up : To obtain clothing/put away  Lower Body Dressing/Undressing Lower body dressing   What is the patient wearing?: [employer], Socks, Pants     Pants- Performed by patient: Thread/unthread right pants leg, Thread/unthread left pants leg Pants- Performed by helper: Pull pants up/down Non-skid slipper socks- Performed by  patient: Don/doff right sock, Don/doff left sock Non-skid slipper socks- Performed by helper: Don/doff left sock, Don/doff right sock   Socks - Performed by helper: Don/doff right sock, Don/doff left sock Shoes - Performed by patient: Don/doff right shoe, Don/doff left shoe       TED Hose - Performed by patient: Don/doff right TED hose, Don/doff left TED hose TED Hose - Performed by helper: Don/doff right TED hose, Don/doff left TED hose  Lower body assist Assist for lower body  dressing: Touching or steadying assistance (Pt > 75%)      Toileting Toileting Toileting activity did not occur: No continent bowel/bladder event Toileting steps completed by patient: Adjust clothing prior to toileting, Adjust clothing after toileting Toileting steps completed by helper: Performs perineal hygiene Toileting Assistive Devices: Other (comment) (bed side commode)  Toileting assist Assist level: Touching or steadying assistance (Pt.75%)   Transfers Chair/bed transfer   Chair/bed transfer method: Lateral scoot Chair/bed transfer assist level: Touching or steadying assistance (Pt > 75%) Chair/bed transfer assistive device: Armrests, Sliding board Mechanical lift: Maximove   Locomotion Ambulation Ambulation activity did not occur: Safety/medical concerns         Wheelchair   Type: Manual Max wheelchair distance: 150 ft Assist Level: No help, No cues, assistive device, takes more than reasonable amount of time  Cognition Comprehension Comprehension assist level: Follows complex conversation/direction with no assist  Expression Expression assist level: Expresses complex ideas: With extra time/assistive device  Social Interaction Social Interaction assist level: Interacts appropriately with others - No medications needed.  Problem Solving Problem solving assist level: Solves complex problems: Recognizes & self-corrects  Memory Memory assist level: Complete Independence: No helper    Medical Problem List and Plan: 1.  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  DVT: LLE DVT - Xarelto started 3. Pain Management: complains of low back pain  -continue mobic but increase to 15mg  qd scheduled. robaxin, tramadol prn  -needs Kpad---ordered but not delivered  -Paraesthesias controlled on Neurontin tid and Mobic daily.  4. Mood: LCSW to follow for evaluation and support.  5. Neuropsych: This  patient is capable of making decisions on her own behalf. 6. Skin/Wound Care: Educate on importance of boosting and pressure relief measures.  7. Fluids/Electrolytes/Nutrition: Monitor I/O.   BMP within acceptable range on 6/9 8. T2DM: new diagnosis with Hgb A1C-6.9 at admission.   Monitor BS ac/hs.    Educate patient on CM diet.   SSI for now  Elevated in last 24 hours, likely secondary to infection  Cont to monitor 9. Neurogenic bowel:    Adjusting meds further on 6/6  KUB reviewed showing stool burned - increased reg x1 - pt refused 10. Morbid obesity: Educate on appropriate diet. Will require bariatric equipment as well as skin monitoring to prevent breakdown.   11. Neurogenic bladder: foley d/ced with voiding trial  Low dose urecholine started 5/20, d/c'ed   Enalbrex d/ced 12. Acute lower UTI  Ucx with Proteus and E.Coli  Resistant to macrobid, Keflex completed 5/19-5/26  UA+, Ucx Proteus  Cipro started 6/8-6/14 13. Hyponatremia: Resolved  Cont to monitor 14. Transaminitis: Resolved  Cont to monitor 15. ABLA  Hb 9.9 on 6/9  Cont to monitor 16. Hemorrhoids  Anusol ordered 5/24  Educated on scheduled dosage 17. Febrile  See #12  Blood cultures remain pending  Afebrile x24 hours  Improving  IVF d/ced  LOS (Days) 26  A FACE TO FACE EVALUATION WAS PERFORMED  Traci [name] Lorie Phenix 01/24/2017 8:06 AM

## 2017-01-24 NOTE — Plan of Care (Signed)
Problem: SCI BLADDER ELIMINATION Goal: RH STG MANAGE BLADDER WITH ASSISTANCE STG Manage Bladder With Mod Assistance   Outcome: Not Progressing Total assistance. Goal: RH STG MANAGE BLADDER WITH EQUIPMENT WITH ASSISTANCE STG Manage Bladder With Equipment With Mod Assistance   Outcome: Not Progressing Requiring I & O caths, total assistance.  Problem: RH SKIN INTEGRITY Goal: RH STG MAINTAIN SKIN INTEGRITY WITH ASSISTANCE STG Maintain Skin Integrity With Mod I Assistance.   Outcome: Progressing Able to direct, staff to assess skin.

## 2017-01-24 NOTE — Discharge Summary (Signed)
Physician Discharge Summary  Patient ID: Traci [name] MRN: [id number] DOB/AGE: September 14, 1975 41 y.o.  Admit date: 12/29/2016 Discharge date: 01/27/2017  Discharge Diagnoses:  Principal Problem:   Thoracic myelopathy Active Problems:   Transverse myelitis ([place])   Morbid obesity ([place])   Diabetes mellitus type 2 in obese [facility])   Neurogenic bladder   Neurogenic bowel   Acute lower UTI   Hyponatremia   Peroneal DVT (deep venous thrombosis), left ([facility])   Acute blood loss anemia   Neuropathic pain   Hemorrhoids   Proteus infection   Discharged Condition: improved  Significant Diagnostic Studies: Dg Abd 1 View  Result Date: 01/21/2017 CLINICAL DATA:  Constipation. EXAM: ABDOMEN - 1 VIEW COMPARISON:  None. FINDINGS: Moderate fecal loading throughout the colon. Calcifications in the left side of the pelvis may simply represent phleboliths but are nonspecific. No bowel obstruction. No other acute abnormalities. IMPRESSION: Moderate fecal loading in the colon. Electronically Signed   By: Traci [name] M.D   On: 01/21/2017 17:06    Labs:  Basic Metabolic Panel: BMP Latest Ref Rng & Units 01/23/2017 01/19/2017 01/12/2017  Glucose 65 - 99 mg/dL 125(H) 113(H) 123(H)  BUN 6 - 20 mg/dL 11 10 11   Creatinine 0.44 - 1.00 mg/dL 0.95 0.58 0.54  BUN/Creat Ratio 9 - 23 - - -  Sodium 135 - 145 mmol/L 135 137 137  Potassium 3.5 - 5.1 mmol/L 3.9 3.7 3.9  Chloride 101 - 111 mmol/L 102 102 106  CO2 22 - 32 mmol/L 25 25 23   Calcium 8.9 - 10.3 mg/dL 8.8(L) 9.1 9.0    CBC: CBC Latest Ref Rng & Units 01/23/2017 01/19/2017 01/12/2017  WBC 4.0 - 10.5 K/uL 7.6 4.9 7.5  Hemoglobin 12.0 - 15.0 g/dL 9.9(L) 10.6(L) 10.2(L)  Hematocrit 36.0 - 46.0 % 31.7(L) 34.1(L) 32.1(L)  Platelets 150 - 400 K/uL 222 288 178    CBG:  Recent Labs Lab 01/26/17 1203 01/26/17 1659 01/26/17 2121 01/27/17 0652 01/27/17 1152  GLUCAP 102* 120* 174* 171* 162*    Brief HPI:   Traci Gonzalezis a 41 y.o.femalewith history  of morbid obesity, DDD with reports of left sciatica over past couple of months and one week history of pain LLE with numbness tingling. She developed sudden onset of LLE progressing to RLE weakness, numbness and burning, fevers and band like pain around lumbar region. She was admitted on 12/20/16 and found to have decreased rectal tone as well as urinary incontinence.  MRI Tspine showed hyperintense signal abnormality within mid thoracic cord to conus medullaris concerning for demyelination and moderate canal stenosis at L4/5. and Neurology felt that symptoms were due to transverse myelitis with appearance most consistent with inflammatory v/s viral. Patient was treated with IV solumedrol X 5 days as well as empiric IV acyclovir. LP done showing elevated WBC 106 and glucose 92. CSF negative for oligoclonal bands.    Dr. Arnoldo [name] consulted for input and felt that LLE pain due to L4/5 moderate stenosis and that this could be addressed in future if necessary. She had minimal improvement with steroids and treated with IVIG X 5 days. Serum NMO-IgG, Anti JO-1, ENA SM ab and CMV PCR have been negative.  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 She has had some improvement in sensation BLE but continues to have paraplegia with substantial decline in mobility as well as ability to carry out ADL tasks. CIR was consulted for follow up therapy    Hospital Course: Traci [name] was admitted to  rehab 12/29/2016 for inpatient therapies to consist of PT and OT at least three hours five days a week. Past admission physiatrist, therapy team and rehab RN have worked together to provide customized collaborative inpatient rehab.  BLE dopplers 5/16 was positive for Left peroneal DVT and she was started on Xarelto for DVT prophylaxis. Due to new diagnosis of DM, she was placed on CM diet and her blood sugars were monitored on ac/hs basis. She has been educated on CM diet and BS have been reasonably  controlled. Foley was discontinued and she was found to have E coli/Proteus UTI at admission. She was  treated with Keflex X 7 days and started on urecholine due to retention. Her bladder function has improved and urecholine was discontinued due to frequency/ urgency. She has been incontinent of bladder despite scheduled toileting and started on enablex to help manage symptoms. However, she started developing retention with difficulty voiding therefore this was discontinued.   She was started on bowel program and which has needed adjustment when on antibiotic regimen. She reported abdominal pain with dyspepsia and constipation on 6/7 and KUB done revealing "moderate fecal loading throughout the colon". Her bowel program was augmented with good results.   She did develop fevers due to recurrent Proteus UTI on 6/8 and was started on cipro for treatment. She has defervesced and blood cultures X 2 are negative so far. follow up CBC shows white count is stable and malaise has resolved.  She has had frequent stools with antibiotics on board therefore laxative were decreased. Anticipate need for increase in laxative once antibiotics discontinued.  Paraesthesias BLE have been managed with scheduled use of Neurontin as well as Mobic. Tramadol has been used on prn basis additionally. Dr. Sima [name] (Neuropsychologist) was consulted for evaluation and she appears to be coping and handling her current disability and disease very well. He expressed concerns that she might be overcompensating and repressing real emotions. He recommended monitoring her responses and no signs of distress or anxiety noted during her stay. Her mood has been stable and she has showed good participation in therapy. She continues to be limited by paraparesis and requires max to total assist for mobility and self care tasks. She lacks social supports and has elected on SNF for further therapies. She was discharged to [facility] on 01/27/17   Rehab  course: During patient's stay in rehab weekly team conferences were held to monitor patient's progress, set goals and discuss barriers to discharge. At admission patient required max assist for bed mobility and Maxi move for transfers. She required total assist with basic self care tasks. She has had improvement in activity tolerance, balance, postural control, as well as ability to compensate for deficits. She is able to complete bathing with supervision from long sit and is able dress LE with supervision. She requires max assist with toileting. She is able to perform transfers with min assist with assistance for sliding board and WC stabilization. She is able to propel her wheelchair for 200' X 2 at modified independent level.    Disposition: 62-Rehab Facility  Diet: Carb modified medium.   Special Instructions: 1. Continue daily bowel program--Adjust laxative as needed. 2. Toilet every 4 hours while awake. Monitor voiding with post void checks 2-3 times a day and cath for bladder volumes > 350 cc. 3. End date cipro 6/14 to complete one week antibiotic therapy.  4. Follow up with neurology in 2-3 weeks.    Discharge Instructions    Ambulatory referral to  Neurology    Complete by:  As directed    An appointment is requested in approximately: 2-3  Weeks for on transverse myelitis.   Ambulatory referral to Physical Medicine Rehab    Complete by:  As directed    13-4 weeks appt     Allergies as of 01/27/2017   No Known Allergies     Medication List    STOP taking these medications   oxyCODONE-acetaminophen 5-325 MG tablet Commonly known as:  PERCOCET/ROXICET     TAKE these medications   acetaminophen 325 MG tablet Commonly known as:  TYLENOL Take 2 tablets (650 mg total) by mouth every 6 (six) hours as needed (for mild to moderate pain or fever).   ciprofloxacin 500 MG tablet Commonly known as:  CIPRO Take 1 tablet (500 mg total) by mouth 2 (two) times daily.   famotidine 20  MG tablet Commonly known as:  PEPCID Take 1 tablet (20 mg total) by mouth 2 (two) times daily.   feeding supplement (GLUCERNA SHAKE) Liqd Take 237 mLs by mouth 3 (three) times daily with meals.   gabapentin 300 MG capsule Commonly known as:  NEURONTIN Take 1 capsule (300 mg total) by mouth 3 (three) times daily. What changed:  medication strength  how much to take  how to take this  when to take this  additional instructions   hydrocortisone 2.5 % rectal cream Commonly known as:  ANUSOL-HC Place rectally 2 (two) times daily.   meloxicam 15 MG tablet Commonly known as:  MOBIC Take 1 tablet (15 mg total) by mouth daily.   methocarbamol 500 MG tablet Commonly known as:  ROBAXIN Take 1 tablet (500 mg total) by mouth every 6 (six) hours as needed for muscle spasms.   nystatin powder Commonly known as:  MYCOSTATIN/NYSTOP Apply topically 3 (three) times daily.   polyethylene glycol packet Commonly known as:  MIRALAX / GLYCOLAX Take 17 g by mouth daily. Adjust to twice a day once antibiotics discontinued. What changed:  when to take this  additional instructions   rivaroxaban 20 MG Tabs tablet Commonly known as:  XARELTO Take 1 tablet (20 mg total) by mouth daily with supper.   senna-docusate 8.6-50 MG tablet Commonly known as:  Senokot-S Take 2 tablets by mouth at bedtime. Increase to twice a day once antibiotics discontinued.   sodium phosphate 7-19 GM/118ML Enem Place 133 mLs (1 enema total) rectally daily at 6 (six) AM.   traMADol 50 MG tablet--Rx # 15 pills Commonly known as:  ULTRAM Take 1 tablet (50 mg total) by mouth every 4 (four) hours as needed for severe pain.   traZODone 50 MG tablet Commonly known as:  DESYREL Take 0.5-1 tablets (25-50 mg total) by mouth at bedtime as needed for sleep.      Follow-up Information    Jamse Arn, MD Follow up.   Specialty:  Physical Medicine and Rehabilitation Why:  office will call you with follow up  appointment Contact information: [address] [place] [address] [phone]           Signed: Bary Leriche 01/27/2017, 12:55 PM

## 2017-01-24 NOTE — Progress Notes (Signed)
Leaking urine when doing I & O cath. I & O cath 500cc's and 700cc's. (+) menses. Urine with sediment and blood clots. Small stool after scheduled enema. Complained of lower back pain and HA. Traci [name] A

## 2017-01-24 NOTE — Plan of Care (Signed)
Problem: SCI BOWEL ELIMINATION Goal: RH STG MANAGE BOWEL WITH ASSISTANCE STG Manage Bowel with Mod Assistance.   Outcome: Not Progressing Total assist Goal: RH STG SCI MANAGE BOWEL WITH MEDICATION WITH ASSISTANCE STG SCI Manage bowel with medication with mod  assistance.    Outcome: Not Progressing Total assistance with administering enema.  Problem: SCI BLADDER ELIMINATION Goal: RH STG MANAGE BLADDER WITH ASSISTANCE STG Manage Bladder With Mod Assistance   Outcome: Not Progressing Total assist-Incontinent and I & O cath  Goal: RH STG MANAGE BLADDER WITH EQUIPMENT WITH ASSISTANCE STG Manage Bladder With Equipment With Mod Assistance   Outcome: Not Progressing Total assistance

## 2017-01-24 NOTE — Progress Notes (Signed)
Occupational Therapy Session Note  Patient Details  Name: Traci [name] MRN: [id number] Date of Birth: [birth year]  Today's Date: 01/24/2017 OT Individual Time: 4174-0814 OT Individual Time Calculation (min): 57 min   Skilled Therapeutic Interventions/Progress Updates:    1:1. Pt requesting bathe and dress this session since no longer on IV medication wanting to change into regular clothes. Pt reporting burning pain in legs however RN already aware. Pt supine to sitting EOB with HOB at 10 degrees with CGA. Pt bathes seated EOB without foot/UE support to improve trunk control with set up using LHSS for washing back and B feet declining bathing buttocks and peri area since NT already washed up prior to session. OT dons ted hose and pt dons pull over shirt pants and socks with supervision with VC for hand placement to pull pants up in back. Pt dons UB clothing with set up. Pt washes hair in shower cap. Exited session with pt semi reclined in bed with call light in reach and all needs met.   Therapy Documentation Precautions:  Precautions Precautions: Fall Precaution Comments: BLE paraparesis Restrictions Weight Bearing Restrictions: No   See Function Navigator for Current Functional Status.   Therapy/Group: Individual Therapy  Tonny Branch 01/24/2017, 12:39 PM

## 2017-01-24 NOTE — Plan of Care (Signed)
Problem: SCI BOWEL ELIMINATION Goal: RH STG SCI MANAGE BOWEL PROGRAM W/ASSIST OR AS APPROPRIATE STG SCI Manage bowel program w/ max assist or as appropriate.    Outcome: Not Progressing Total assist with bowel program.

## 2017-01-25 ENCOUNTER — Inpatient Hospital Stay (HOSPITAL_COMMUNITY): Payer: PRIVATE HEALTH INSURANCE | Admitting: Occupational Therapy

## 2017-01-25 ENCOUNTER — Inpatient Hospital Stay (HOSPITAL_COMMUNITY): Payer: PRIVATE HEALTH INSURANCE | Admitting: Physical Therapy

## 2017-01-25 LAB — GLUCOSE, CAPILLARY
GLUCOSE-CAPILLARY: 110 mg/dL — AB (ref 65–99)
Glucose-Capillary: 124 mg/dL — ABNORMAL HIGH (ref 65–99)
Glucose-Capillary: 124 mg/dL — ABNORMAL HIGH (ref 65–99)
Glucose-Capillary: 83 mg/dL (ref 65–99)

## 2017-01-25 NOTE — Progress Notes (Signed)
Physical Therapy Session Note  Patient Details  Name: Traci [name] MRN: [id number] Date of Birth: 1976-01-29  Today's Date: 01/25/2017 PT Individual Time: 1100-1159 PT Individual Time Calculation (min): 59 min   Short Term Goals: Week 4:  PT Short Term Goal 1 (Week 4): LTG=STG  Skilled Therapeutic Interventions/Progress Updates:    Pt in bed upon arrival, agreeable to PT session. Bed mobility: supine>sitting supervision with rails, Transfers: min assist bed>w/c<>car. Assist with board placement and w/c stabilization. W/C propulsion performed 200 ft X2 mod I on flat level surface. Following session, pt returned to room, up in w/c with all needs in reach.   Therapy Documentation Precautions:  Precautions Precautions: Fall Precaution Comments: BLE paraparesis Restrictions Weight Bearing Restrictions: No    Pain: Having a lot of burning below the knees now. Monitored during session.   See Function Navigator for Current Functional Status.   Therapy/Group: Individual Therapy  Linard Millers, PT 01/25/2017, 12:03 PM

## 2017-01-25 NOTE — Progress Notes (Signed)
[facility] PHYSICAL MEDICINE & REHABILITATION     PROGRESS NOTE  Subjective/Complaints:  Pt seen laying in bed this AM.  She states she slept better overnight with improvement in bowel/bladder,nausea,fevers, chills.   ROS: Denies CP, SOB, N/V/Diarrhea.   Objective: Vital Signs: Blood pressure 130/70, pulse 75, temperature 98.4 F (36.9 C), temperature source Oral, resp. rate 18, height 5' (1.524 m), weight 117.9 kg (259 lb 14.4 oz), SpO2 97 %. No results found.  Recent Labs  01/23/17 0432  WBC 7.6  HGB 9.9*  HCT 31.7*  PLT 222    Recent Labs  01/23/17 0432  NA 135  K 3.9  CL 102  GLUCOSE 125*  BUN 11  CREATININE 0.95  CALCIUM 8.8*   CBG (last 3)   Recent Labs  01/24/17 1645 01/24/17 2137 01/25/17 0634  GLUCAP 97 107* 124*    Wt Readings from Last 3 Encounters:  01/20/17 117.9 kg (259 lb 14.4 oz)  12/25/16 131.7 kg (290 lb 6.4 oz)  12/17/16 127.9 kg (282 lb)    Physical Exam:  BP 130/70 (BP Location: Left Wrist)   Pulse 75   Temp 98.4 F (36.9 C) (Oral)   Resp 18   Ht 5' (1.524 m)   Wt 117.9 kg (259 lb 14.4 oz)   SpO2 97%   BMI 50.76 kg/m  Constitutional: She appears well-developed. No distress. Obese.  HENT: Normocephalicand atraumatic.  Eyes: EOMI. No discharge.  Cardiovascular: RRR. No JVD Respiratory: Normal effort. Clear. GI: Soft. Bowel sounds are normal.   Musculoskeletal: She exhibits no edemaor tenderness.  Neurological: She is alertand oriented.  Speech clear.  Follows commands without difficulty.  Motor: B/l UE motor 5/5 prox to distal.  B/l LE: 0/5 HF, KE and ADF/PF (unchanged).  Sensation diminished to LT below inguinal area (unchanged).  Skin: Skin is warmand dry. She is not diaphoretic.  Psychiatric: She has a normal mood and affect. Her speech is normaland behavior is normal. Judgmentand thought contentnormal. Cognition and memoryare normal.    Assessment/Plan: 1. Functional deficits secondary to Transverse Myelitis  which require 3+ hours per day of interdisciplinary therapy in a comprehensive inpatient rehab setting. Physiatrist is providing close team supervision and 24 hour management of active medical problems listed below. Physiatrist and rehab team continue to assess barriers to discharge/monitor patient progress toward functional and medical goals.  Function:  Bathing Bathing position   Position: Bed  Bathing parts Body parts bathed by patient: Right arm, Left arm, Chest, Abdomen, Right upper leg, Left upper leg, Right lower leg, Left lower leg Body parts bathed by helper: Back  Bathing assist Assist Level: Set up      Upper Body Dressing/Undressing Upper body dressing   What is the patient wearing?: Pull over shirt/dress     Pull over shirt/dress - Perfomed by patient: Thread/unthread left sleeve, Thread/unthread right sleeve, Put head through opening, Pull shirt over trunk          Upper body assist Assist Level: Set up   Set up : To obtain clothing/put away  Lower Body Dressing/Undressing Lower body dressing   What is the patient wearing?: [employer], Socks, Pants     Pants- Performed by patient: Thread/unthread right pants leg, Thread/unthread left pants leg Pants- Performed by helper: Pull pants up/down Non-skid slipper socks- Performed by patient: Don/doff right sock, Don/doff left sock Non-skid slipper socks- Performed by helper: Don/doff left sock, Don/doff right sock   Socks - Performed by helper: Don/doff right sock, Don/doff  left sock Shoes - Performed by patient: Don/doff right shoe, Don/doff left shoe       TED Hose - Performed by patient: Don/doff right TED hose, Don/doff left TED hose TED Hose - Performed by helper: Don/doff right TED hose, Don/doff left TED hose  Lower body assist Assist for lower body dressing: Supervision or verbal cues      Toileting Toileting Toileting activity did not occur: No continent bowel/bladder event Toileting steps completed by  patient: Adjust clothing prior to toileting, Adjust clothing after toileting Toileting steps completed by helper: Performs perineal hygiene Toileting Assistive Devices: Other (comment) (bed side commode)  Toileting assist Assist level: Touching or steadying assistance (Pt.75%), Two helpers   Transfers Chair/bed transfer   Chair/bed transfer method: Lateral scoot Chair/bed transfer assist level: Touching or steadying assistance (Pt > 75%) Chair/bed transfer assistive device: Armrests, Sliding board Mechanical lift: Maximove   Locomotion Ambulation Ambulation activity did not occur: Safety/medical concerns         Wheelchair   Type: Manual Max wheelchair distance: 150 ft Assist Level: No help, No cues, assistive device, takes more than reasonable amount of time  Cognition Comprehension Comprehension assist level: Follows complex conversation/direction with no assist  Expression Expression assist level: Expresses complex ideas: With no assist  Social Interaction Social Interaction assist level: Interacts appropriately with others - No medications needed.  Problem Solving Problem solving assist level: Solves complex problems: Recognizes & self-corrects  Memory Memory assist level: Complete Independence: No helper    Medical Problem List and Plan: 1.  Paraplegia and sensory deficits secondary to Transverse Myelitis involving thoracic and lumbar cord  Cont CIR  B/l PRAFOs  ?D/c today to SNF  Will see patient for hospital follow up in 1 month  2.  DVT: LLE DVT - Xarelto started 3. Pain Management: complains of low back pain  -continue mobic but increase to 15mg  qd scheduled. robaxin, tramadol prn  -needs Kpad---ordered but not delivered  -Paraesthesias controlled on Neurontin tid and Mobic daily.  4. Mood: LCSW to follow for evaluation and support.  5. Neuropsych: This patient is capable of making decisions on her own behalf. 6. Skin/Wound Care: Educate on importance of boosting  and pressure relief measures.  7. Fluids/Electrolytes/Nutrition: Monitor I/O.   BMP within acceptable range on 6/9 8. T2DM: new diagnosis with Hgb A1C-6.9 at admission.   Monitor BS ac/hs.    Educate patient on CM diet.   SSI for now  Overall controlled 6/11  Cont to monitor 9. Neurogenic bowel:    Adjusting meds further on 6/6  KUB reviewed showing stool burned - increased reg x1 - pt refused 10. Morbid obesity: Educate on appropriate diet. Will require bariatric equipment as well as skin monitoring to prevent breakdown.   11. Neurogenic bladder: foley d/ced with voiding trial  Low dose urecholine started 5/20, d/c'ed   Enalbrex d/ced 12. Acute lower UTI  Ucx with Proteus and E.Coli  Resistant to macrobid, Keflex completed 5/19-5/26  UA+, Ucx Proteus  Cipro started 6/8-6/14 13. Hyponatremia: Resolved  Cont to monitor 14. Transaminitis: Resolved  Cont to monitor 15. ABLA  Hb 9.9 on 6/9  Cont to monitor 16. Hemorrhoids  Anusol ordered 5/24  Educated on scheduled dosage 17. Febrile  See #12  Blood cultures remain pending 6/11  Afebrile x48 hours  Improving  IVF d/ced  LOS (Days) 27 A FACE TO FACE EVALUATION WAS PERFORMED  Domanik Rainville Lorie Phenix 01/25/2017 8:56 AM

## 2017-01-25 NOTE — Progress Notes (Signed)
PRN robaxin given, per patient's request at 2206, for C/O HA. At 2330, no void 6 hours, I & O cath=550cc's. Small, incontinent, mushy BM as well. At 0600, no void, I & O cath=450cc's. Scheduled Fleet's enema given this AM with loose results. Dig stim with moderate amount of stool. Traci [name] A

## 2017-01-25 NOTE — Plan of Care (Signed)
Problem: SCI BOWEL ELIMINATION Goal: RH STG SCI MANAGE BOWEL WITH MEDICATION WITH ASSISTANCE STG SCI Manage bowel with medication with mod  assistance.    Outcome: Not Progressing Max assist

## 2017-01-25 NOTE — Progress Notes (Signed)
Occupational Therapy Session Note  Patient Details  Name: Traci [name] MRN: [id number] Date of Birth: 1976/03/05  Today's Date: 01/25/2017 OT Individual Time: 0903-1003 OT Individual Time Calculation (min): 60 min    Short Term Goals: Week 4:  OT Short Term Goal 1 (Week 4): Continue working on [employer].  Skilled Therapeutic Interventions/Progress Updates:    Pt completed bathing and dressing sit to supine in the bed.  She needed min assist to achieve initial circle sitting from supine in order to complete UB bathing and dressing as well as all LB bathing and dressing.  She returned to supine for rolling to wash peri area and pull pants over hips.  She needed min assist for washing buttocks secondary to bowel accident but she was able to wash the front peri area with supervision.  Supervision for pulling pants over hips rolling as well.  Pt left in bed at end of session with call button and phone in reach.    Therapy Documentation Precautions:  Precautions Precautions: Fall Precaution Comments: BLE paraparesis Restrictions Weight Bearing Restrictions: No   Pain: Pain Assessment Pain Assessment: No/denies pain Pain Score: 1  ADL:  See Function Navigator for Current Functional Status.   Therapy/Group: Individual Therapy  Shanoah Asbill OTR/L 01/25/2017, 12:09 PM

## 2017-01-25 NOTE — Progress Notes (Signed)
Occupational Therapy Session Note  Patient Details  Name: Nzinga Ferran MRN: [id number] Date of Birth: 20-Sep-1975  Today's Date: 01/25/2017 OT Individual Time: 1300-1414 OT Individual Time Calculation (min): 74 min    Skilled Therapeutic Interventions/Progress Updates:    Pt completed transfer from wheelchair to drop arm commode with min assist, after therapist assisted with placing of the sliding board and provided support under her LEs.  She was able to push her pants down but needed mod assist for removal of the brief, both with lateral leans side to side.  Max assist for toilet hygiene sitting on the seat as well as for donning new brief.  She worked on donning pants, with pt needing max assist for all aspects in the sitting position.  Pt next rolled herself to the therapy gym with modified independence.  She was able to complete 4 mins on the UE ergonometer on level 12 resistance.  RPMs maintained at 28-30 throughout session.  Returned to room at end of session with pt deciding to stay up in wheelchair.  Call button in reach.    Therapy Documentation Precautions:  Precautions Precautions: Fall Precaution Comments: BLE paraparesis Restrictions Weight Bearing Restrictions: No  Pain: Pain Assessment Pain Assessment: No/denies pain ADL: See Function Navigator for Current Functional Status.   Therapy/Group: Individual Therapy  Hoy Fallert OTR/L 01/25/2017, 3:41 PM

## 2017-01-26 ENCOUNTER — Inpatient Hospital Stay (HOSPITAL_COMMUNITY): Payer: PRIVATE HEALTH INSURANCE | Admitting: Occupational Therapy

## 2017-01-26 ENCOUNTER — Inpatient Hospital Stay (HOSPITAL_COMMUNITY): Payer: PRIVATE HEALTH INSURANCE | Admitting: Physical Therapy

## 2017-01-26 LAB — GLUCOSE, CAPILLARY
GLUCOSE-CAPILLARY: 174 mg/dL — AB (ref 65–99)
Glucose-Capillary: 128 mg/dL — ABNORMAL HIGH (ref 65–99)
Glucose-Capillary: 102 mg/dL — ABNORMAL HIGH (ref 65–99)
Glucose-Capillary: 120 mg/dL — ABNORMAL HIGH (ref 65–99)

## 2017-01-26 MED ORDER — SENNOSIDES-DOCUSATE SODIUM 8.6-50 MG PO TABS: 2.0000 | ORAL_TABLET | Freq: Every day | ORAL | Status: DC

## 2017-01-26 MED ORDER — RIVAROXABAN 20 MG PO TABS: 20.0000 mg | ORAL_TABLET | Freq: Every day | ORAL | Status: DC

## 2017-01-26 MED ORDER — FLEET ENEMA 7-19 GM/118ML RE ENEM: 1.0000 | ENEMA | Freq: Every day | RECTAL | 0 refills | Status: DC

## 2017-01-26 MED ORDER — GABAPENTIN 300 MG PO CAPS: 300.0000 mg | ORAL_CAPSULE | Freq: Three times a day (TID) | ORAL | Status: DC

## 2017-01-26 MED ORDER — HYDROCORTISONE 2.5 % RE CREA: TOPICAL_CREAM | Freq: Two times a day (BID) | RECTAL | 0 refills | Status: DC

## 2017-01-26 MED ORDER — METHOCARBAMOL 500 MG PO TABS: 500.0000 mg | ORAL_TABLET | Freq: Four times a day (QID) | ORAL | Status: DC | PRN

## 2017-01-26 MED ORDER — TRAMADOL HCL 50 MG PO TABS: 50.0000 mg | ORAL_TABLET | ORAL | Status: DC | PRN

## 2017-01-26 MED ORDER — MELOXICAM 15 MG PO TABS: 15.0000 mg | ORAL_TABLET | Freq: Every day | ORAL | Status: DC

## 2017-01-26 MED ORDER — TRAMADOL HCL 50 MG PO TABS: 50.0000 mg | ORAL_TABLET | ORAL | 0 refills | Status: DC | PRN

## 2017-01-26 MED ORDER — TRAZODONE HCL 50 MG PO TABS: 25.0000 mg | ORAL_TABLET | Freq: Every evening | ORAL | Status: DC | PRN

## 2017-01-26 MED ORDER — NYSTATIN 100000 UNIT/GM EX POWD: Freq: Three times a day (TID) | CUTANEOUS | 0 refills | Status: DC

## 2017-01-26 MED ORDER — POLYETHYLENE GLYCOL 3350 17 G PO PACK: 17.0000 g | PACK | Freq: Every day | ORAL | Status: DC

## 2017-01-26 MED ORDER — ACETAMINOPHEN 325 MG PO TABS: 650.0000 mg | ORAL_TABLET | Freq: Four times a day (QID) | ORAL | Status: DC | PRN

## 2017-01-26 MED ORDER — GLUCERNA SHAKE PO LIQD: 237.0000 mL | Freq: Three times a day (TID) | ORAL | 0 refills | Status: DC

## 2017-01-26 MED ORDER — POLYETHYLENE GLYCOL 3350 17 G PO PACK: 17.0000 g | PACK | Freq: Every day | ORAL | 0 refills | Status: DC

## 2017-01-26 MED ORDER — FAMOTIDINE 20 MG PO TABS: 20.0000 mg | ORAL_TABLET | Freq: Two times a day (BID) | ORAL | Status: DC

## 2017-01-26 MED ORDER — SENNOSIDES-DOCUSATE SODIUM 8.6-50 MG PO TABS
2.0000 | ORAL_TABLET | Freq: Two times a day (BID) | ORAL | Status: DC
  Administered 2017-01-27: 2 via ORAL
  Filled 2017-01-26: qty 2

## 2017-01-26 NOTE — Progress Notes (Signed)
[facility]     PROGRESS [name]  Subjective/Complaints:  Pt seen laying in bed this AM.  Traci [name] slept well overnight.  [name] notes improvement in bowel, but not much with bladder. [name] notes improvement in sensation and neuropathy.   ROS: Denies CP, SOB, N/V/Diarrhea.   Objective: Vital Signs: Blood pressure 137/64, pulse 81, temperature 98.5 F (36.9 C), temperature source Oral, resp. rate 17, height 5' (1.524 m), weight 117.9 kg (259 lb 14.4 oz), SpO2 94 %. No results found. No results for input(s): WBC, HGB, HCT, PLT in the last 72 hours. No results for input(s): NA, K, CL, GLUCOSE, BUN, CREATININE, CALCIUM in the last 72 hours.  Invalid input(s): CO CBG (last 3)   Recent Labs  01/25/17 1724 01/25/17 2121 01/26/17 0631  GLUCAP 83 110* 128*    Wt Readings from Last 3 Encounters:  01/20/17 117.9 kg (259 lb 14.4 oz)  12/25/16 131.7 kg (290 lb 6.4 oz)  12/17/16 127.9 kg (282 lb)    Physical Exam:  BP 137/64 (BP Location: Right Wrist)   Pulse 81   Temp 98.5 F (36.9 C) (Oral)   Resp 17   Ht 5' (1.524 m)   Wt 117.9 kg (259 lb 14.4 oz)   SpO2 94%   BMI 50.76 kg/m  Constitutional: [facility] [name] appears well-developed. No distress. Obese.  HENT: Normocephalicand atraumatic.  Eyes: EOMI. No discharge.  Cardiovascular: RRR. No JVD Respiratory: Normal effort. Clear. GI: Soft. Bowel sounds are normal.   Musculoskeletal: Traci [name] exhibits no edemaor tenderness.  Neurological: Traci [name] is alertand oriented.  Speech clear.  Follows commands without difficulty.  Motor: B/l UE motor 5/5 prox to distal.  B/l LE: 0/5 HF, KE and ADF/PF (unchanged).  Sensation diminished to LT below thighs (improving).  Skin: Skin is warmand dry. Traci [name] is not diaphoretic.  Psychiatric: Traci [name] has a normal mood and affect. Her speech is normaland behavior is normal. Judgmentand thought contentnormal. Cognition and memoryare normal.    Assessment/Plan: 1. Functional deficits  secondary to Transverse Myelitis which require 3+ hours per day of interdisciplinary therapy in a comprehensive inpatient rehab setting. Physiatrist is providing close team supervision and 24 hour management of active medical problems listed below. Physiatrist and rehab team continue to assess barriers to discharge/monitor patient progress toward functional and medical goals.  Function:  Bathing Bathing position   Position: Bed  Bathing parts Body parts bathed by patient: Right arm, Left arm, Chest, Abdomen, Front perineal area, Right upper leg, Left upper leg, Right lower leg, Left lower leg, Back Body parts bathed by helper: Buttocks  Bathing assist Assist Level: Set up      Upper Body Dressing/Undressing Upper body dressing   What is the patient wearing?: Pull over shirt/dress     Pull over shirt/dress - Perfomed by patient: Thread/unthread right sleeve, Thread/unthread left sleeve, Put head through opening, Pull shirt over trunk          Upper body assist Assist Level: Set up   Set up : To obtain clothing/put away  Lower Body Dressing/Undressing Lower body dressing   What is the patient wearing?: Ted Hose, Pants, Non-skid slipper socks     Pants- Performed by patient: Thread/unthread right pants leg, Thread/unthread left pants leg, Pull pants up/down Pants- Performed by helper: Pull pants up/down Non-skid slipper socks- Performed by patient: Don/doff right sock, Don/doff left sock Non-skid slipper socks- Performed by helper: Don/doff left sock, Don/doff right sock   Socks - Performed by helper: Don/doff  right sock, Don/doff left sock Shoes - Performed by patient: Don/doff right shoe, Don/doff left shoe       TED Hose - Performed by patient: Don/doff right TED hose, Don/doff left TED hose TED Hose - Performed by helper: Don/doff right TED hose, Don/doff left TED hose  Lower body assist Assist for lower body dressing: Supervision or verbal cues       Toileting Toileting Toileting activity did not occur: No continent bowel/bladder event Toileting steps completed by patient: Adjust clothing prior to toileting, Adjust clothing after toileting Toileting steps completed by helper: Adjust clothing prior to toileting, Performs perineal hygiene, Adjust clothing after toileting Toileting Assistive Devices: Other (comment) (bsc)  Toileting assist Assist level: Supervision or verbal cues   Transfers Chair/bed transfer   Chair/bed transfer method: Lateral scoot Chair/bed transfer assist level: Touching or steadying assistance (Pt > 75%) Chair/bed transfer assistive device: Armrests, Sliding board Mechanical lift: Maximove   Locomotion Ambulation Ambulation activity did not occur: Safety/medical concerns         Wheelchair   Type: Manual Max wheelchair distance: 200 ft Assist Level: No help, No cues, assistive device, takes more than reasonable amount of time  Cognition Comprehension Comprehension assist level: Follows complex conversation/direction with no assist  Expression Expression assist level: Expresses complex ideas: With no assist  Social Interaction Social Interaction assist level: Interacts appropriately with others - No medications needed.  Problem Solving Problem solving assist level: Solves complex problems: Recognizes & self-corrects  Memory Memory assist level: Complete Independence: No helper    Medical Problem List and Plan: 1.  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  DVT: LLE DVT - Xarelto started 3. Pain Management: complains of low back pain  -continue mobic but increase to 15mg  qd scheduled. robaxin, tramadol prn  -needs Kpad---ordered but not delivered  -Paraesthesias controlled on Neurontin tid and Mobic daily.  4.  Mood: LCSW to follow for evaluation and support.  5. Neuropsych: This patient is capable of making decisions on her own behalf. 6. Skin/Wound Care: Educate on importance of boosting and pressure relief measures.  7. Fluids/Electrolytes/Nutrition: Monitor I/O.   BMP within acceptable range on 6/9 8. T2DM: new diagnosis with Hgb A1C-6.9 at admission.   Monitor BS ac/hs.    Educate patient on CM diet.   SSI for now  Overall controlled 6/12  Cont to monitor 9. Neurogenic bowel:    Adjusting meds further on 6/6  KUB reviewed showing stool burned - increased reg x1 - pt refused 10. Morbid obesity: Educate on appropriate diet. Will require bariatric equipment as well as skin monitoring to prevent breakdown.   11. Neurogenic bladder: foley d/ced with voiding trial  Low dose urecholine started 5/20, d/c'ed   Enalbrex d/ced  Confounded by UTI 12. Acute lower UTI  Ucx with Proteus and E.Coli  Resistant to macrobid, Keflex completed 5/19-5/26  UA+, Ucx Proteus  Cipro started 6/8-6/14 13. Hyponatremia: Resolved  Cont to monitor 14. Transaminitis: Resolved  Cont to monitor 15. ABLA  Hb 9.9 on 6/9  Cont to monitor 16. Hemorrhoids  Anusol ordered 5/24  Educated on scheduled dosage 17. Febrile  See #12  Blood cultures remain pending 6/12  Afebrile x48 hours  Improving  IVF d/ced  LOS (Days) 28 A FACE TO FACE EVALUATION  WAS PERFORMED  Parley Pidcock Lorie Phenix 01/26/2017 10:02 AM

## 2017-01-26 NOTE — Progress Notes (Signed)
Occupational Therapy Session Note  Patient Details  Name: Traci [name] MRN: [id number] Date of Birth: 05/15/1976  Today's Date: 01/26/2017 OT Individual Time: 0905-1003 OT Individual Time Calculation (min): 58 min    Short Term Goals: Week 4:  OT Short Term Goal 1 (Week 4): Continue working on [employer].  Skilled Therapeutic Interventions/Progress Updates:    Pt completed bathing and dressing bed level during session.  She was able to transition to long sitting from slightly elevated bed of 20 degrees, but needed therapist to adjust hips in order to maintain forward balance for all UB and LB bathing.  She was able to complete all bathing with supervision from long/circle sit.  She was able to donn all clothing over her feet from this position as well and transition to supine rolling with supervision for pulling items over her hips.  Pt did not attempt washing peri area and donning new brief, as this was completed just previous to this session.  Pt left in bed with call button in reach, awaiting next therapy session with PT.   Therapy Documentation Precautions:  Precautions Precautions: Fall Precaution Comments: BLE paraparesis Restrictions Weight Bearing Restrictions: No   Pain: Pain Assessment Pain Assessment: No/denies pain ADL:    See Function Navigator for Current Functional Status.   Therapy/Group: Individual Therapy  Jessamyn Watterson OTR/L 01/26/2017, 12:16 PM

## 2017-01-26 NOTE — Progress Notes (Signed)
Occupational Therapy Session Note  Patient Details  Name: Traci [name] MRN: [id number] Date of Birth: [birth year]  Today's Date: 01/26/2017 OT Individual Time: 1300-1417 OT Individual Time Calculation (min): 77 min    Short Term Goals: Week 4:  OT Short Term Goal 1 (Week 4): Continue working on [employer].  Skilled Therapeutic Interventions/Progress Updates:    Pt worked on cleaning up bowel and bladder accident to start the session from bed level.  Total assist to complete task and donn new brief, with pt rolling side to side with supervision using the bed rails.  She was able to manage her pants over her hips once brief was donned and transition to sitting EOB with min assist.  Worked on sliding board transfers with min assist to wheelchair and to tub/shower bench.  Also had pt complete 6 mins on the UE ergonometer on Level 5 random setting.  Oxygen sats at 97% with HR at 108 after completion of set.  Pt returned back to room via wheelchair at modified independent level to conclude session.    Therapy Documentation Precautions:  Precautions Precautions: Fall Precaution Comments: BLE paraparesis Restrictions Weight Bearing Restrictions: No  Pain: Pain Assessment Pain Assessment: No/denies pain ADL: See Function Navigator for Current Functional Status.   Therapy/Group: Individual Therapy  Irmgard Rampersaud OTR/L 01/26/2017, 4:09 PM

## 2017-01-26 NOTE — Progress Notes (Signed)
Physical Therapy Session Note  Patient Details  Name: Traci [name] MRN: [id number] Date of Birth: 1976/03/19  Today's Date: 01/26/2017 PT Individual Time: 1000-1100 PT Individual Time Calculation (min): 60 min   Short Term Goals: Week 3:  PT Short Term Goal 1 (Week 3): LTG=STG PT Short Term Goal 1 - Progress (Week 3): Partly met  Skilled Therapeutic Interventions/Progress Updates:    Pt in bed upon arrival, agreeable to PT session. Bed mobility: supervision rolling and supine>sitting. Working on donning leg loops.Transfers: min assist bed>w/c, w/c<>mat table. Assist provided with board placement and stabilization of w/c. Performed from bed>w/c, w/c<>mat table.  Propelling w/c to gym mod I. Working on seated foot to knee stretch, increased difficulty with Rt LE. Following session, pt returned to room in w/c. All needs in reach.   Therapy Documentation Precautions:  Precautions Precautions: Fall Precaution Comments: BLE paraparesis Restrictions Weight Bearing Restrictions: No Pain: 0/10 - burning in legs but no pain.    See Function Navigator for Current Functional Status.   Therapy/Group: Individual Therapy  Linard Millers, PT 01/26/2017, 10:07 AM

## 2017-01-27 ENCOUNTER — Encounter: Payer: Self-pay | Admitting: Neurology

## 2017-01-27 LAB — GLUCOSE, CAPILLARY
GLUCOSE-CAPILLARY: 162 mg/dL — AB (ref 65–99)
Glucose-Capillary: 171 mg/dL — ABNORMAL HIGH (ref 65–99)

## 2017-01-27 MED ORDER — CIPROFLOXACIN HCL 500 MG PO TABS: 500.0000 mg | ORAL_TABLET | Freq: Two times a day (BID) | ORAL | Status: DC

## 2017-01-27 NOTE — Progress Notes (Signed)
Report called to [facility] for room 3217 to [facility]. [facility]. Transferred per ambulance with orders.

## 2017-01-27 NOTE — Plan of Care (Signed)
Problem: SCI BLADDER ELIMINATION Goal: RH STG MANAGE BLADDER WITH ASSISTANCE STG Manage Bladder With Mod Assistance   Outcome: Adequate for Discharge Will be transferred to skilled facility, is not assisting with toileting at all.

## 2017-01-27 NOTE — Progress Notes (Signed)
Social Work  Discharge Note  The overall goal for the admission was met for:   Discharge location: No - plan changed to SNF  Length of Stay: No - delay in d/c due to change in d/c plan and awaiting insurance auth for SNF.  Total LOS = 29 days  Discharge activity level: Yes - min assist overall  Home/community participation: No  Services provided included: MD, RD, PT, OT, RN, TR, Pharmacy, Neuropsych and SW  Financial Services: Private Insurance: [place]  Follow-up services arranged: Other: SNF at [facility]  Comments (or additional information):  Patient/Family verbalized understanding of follow-up arrangements: Yes  Individual responsible for coordination of the follow-up plan: pt  Confirmed correct DME delivered: NA - d/c to SNF    [name]

## 2017-01-27 NOTE — Progress Notes (Signed)
Occupational Therapy Discharge Summary  Patient Details  Name: Traci [name] MRN: [id number] Date of Birth: 09-27-1975    Patient has met 9 of 10 long term goals due to improved balance, postural control and ability to compensate for deficits.  Patient to discharge at [facility] Assist level.  Patient's care partner requires assistance to provide the necessary physical assistance at discharge.    Reasons goals not met: Pt needs max assist for toileting tasks sitting on drop arm commode.  Recommendation:  Patient will benefit from ongoing skilled OT services in skilled nursing facility setting to continue to advance functional skills in the area of BADL.  Pt continues to demonstrate significant paraparesis requiring min for functional transfers with use of the sliding board as well as max assist for toilet hygiene and clothing management in sitting.  Feel she cannot be by herself at home as she does not have 24 hour supervision.  Recommend continued SNF level therapy with anticipation of reaching as independent level as possible.    Equipment: No equipment provided  Reasons for discharge: treatment goals met and discharge from hospital  Patient/family agrees with progress made and goals achieved: Yes  OT Discharge Precautions/Restrictions  Precautions Precautions: Fall Precaution Comments: BLE paraparesis Restrictions Weight Bearing Restrictions: No  Vision Baseline Vision/History: Wears glasses Wears Glasses: At all times Patient Visual Report: No change from baseline Vision Assessment?: No apparent visual deficits Eye Alignment: Within Functional Limits Perception  Perception: Within Functional Limits Praxis Praxis: Intact Cognition Overall Cognitive Status: Within Functional Limits for tasks assessed Arousal/Alertness: Awake/alert Orientation Level: Oriented X4 Memory: Appears intact Awareness: Appears intact Problem Solving: Appears intact Safety/Judgment: Appears  intact Sensation Sensation Light Touch: Impaired Detail Light Touch Impaired Details: Impaired LLE;Impaired RLE Stereognosis: Appears Intact Proprioception: Appears Intact Additional Comments: Sensation intact in BUEs Coordination Gross Motor Movements are Fluid and Coordinated: No Fine Motor Movements are Fluid and Coordinated: No Coordination and Movement Description: intact in UEs, paraplegia in LEs Motor  Motor Motor: Paraplegia Motor - Discharge Observations: Paraplegia still present in bilateral LEs Mobility  Bed Mobility Bed Mobility: Rolling Right;Rolling Left;Sit to Supine;Supine to Sit Rolling Right: 5: Supervision;With rail Rolling Left: 5: Supervision;With rail Supine to Sit: 4: Min assist  Trunk/Postural Assessment  Cervical Assessment Cervical Assessment: Within Functional Limits Thoracic Assessment Thoracic Assessment: Within Functional Limits Lumbar Assessment Lumbar Assessment: Exceptions to [facility] (Maintains posterior pelvic tilt)  Balance Balance Balance Assessed: Yes Static Sitting Balance Static Sitting - Level of Assistance: 6: Modified independent (Device/Increase time) Dynamic Sitting Balance Dynamic Sitting - Balance Support: Feet supported;During functional activity Dynamic Sitting - Level of Assistance: 5: Stand by assistance Extremity/Trunk Assessment RUE Assessment RUE Assessment: Within Functional Limits LUE Assessment LUE Assessment: Within Functional Limits   See Function Navigator for Current Functional Status.  Bluma Buresh OTR/L 01/27/2017, 4:30 PM

## 2017-01-27 NOTE — Progress Notes (Addendum)
[place] PHYSICAL MEDICINE & REHABILITATION     PROGRESS NOTE  Subjective/Complaints:  Pt seen laying in bed this AM.  She slept well overnight.  Per patient and therapy, pt is being discharged today. Notes improvement in bowel/bladder.  ROS: Denies CP, SOB, N/V/Diarrhea.   Objective: Vital Signs: Blood pressure (!) 113/56, pulse 83, temperature 99.2 F (37.3 C), temperature source Oral, resp. rate 18, height 5' (1.524 m), weight 121.5 kg (267 lb 12.8 oz), SpO2 100 %. No results found. No results for input(s): WBC, HGB, HCT, PLT in the last 72 hours. No results for input(s): NA, K, CL, GLUCOSE, BUN, CREATININE, CALCIUM in the last 72 hours.  Invalid input(s): CO CBG (last 3)   Recent Labs  01/26/17 1659 01/26/17 2121 01/27/17 0652  GLUCAP 120* 174* 171*    Wt Readings from Last 3 Encounters:  01/27/17 121.5 kg (267 lb 12.8 oz)  12/25/16 131.7 kg (290 lb 6.4 oz)  12/17/16 127.9 kg (282 lb)    Physical Exam:  BP (!) 113/56 (BP Location: Left Arm)   Pulse 83   Temp 99.2 F (37.3 C) (Oral)   Resp 18   Ht 5' (1.524 m)   Wt 121.5 kg (267 lb 12.8 oz)   SpO2 100%   BMI 52.30 kg/m  Constitutional: She appears well-developed. No distress. Obese.  HENT: Normocephalicand atraumatic.  Eyes: EOMI. No discharge.  Cardiovascular: RRR. No JVD Respiratory: Normal effort. Clear. GI: Soft. Bowel sounds are normal.   Musculoskeletal: She exhibits no edemaor tenderness.  Neurological: She is alertand oriented.  Speech clear.  Follows commands without difficulty.  Motor: B/l UE motor 5/5 prox to distal.  B/l LE: 0/5 HF, KE and ADF/PF (stable).  Sensation diminished to LT below thighs (slowly improving).  Skin: Skin is warmand dry. She is not diaphoretic.  Psychiatric: She has a normal mood and affect. Her speech is normaland behavior is normal. Judgmentand thought contentnormal. Cognition and memoryare normal.    Assessment/Plan: 1. Functional deficits secondary to  Transverse Myelitis which require 3+ hours per day of interdisciplinary therapy in a comprehensive inpatient rehab setting. Physiatrist is providing close team supervision and 24 hour management of active medical problems listed below. Physiatrist and rehab team continue to assess barriers to discharge/monitor patient progress toward functional and medical goals.  Function:  Bathing Bathing position   Position: Bed  Bathing parts Body parts bathed by patient: Right arm, Left arm, Chest, Abdomen, Right upper leg, Left upper leg, Right lower leg, Left lower leg, Back Body parts bathed by helper: Buttocks  Bathing assist Assist Level: Set up      Upper Body Dressing/Undressing Upper body dressing   What is the patient wearing?: Pull over shirt/dress     Pull over shirt/dress - Perfomed by patient: Thread/unthread right sleeve, Thread/unthread left sleeve, Put head through opening, Pull shirt over trunk          Upper body assist Assist Level: Set up   Set up : To obtain clothing/put away  Lower Body Dressing/Undressing Lower body dressing   What is the patient wearing?: Ted Hose, Pants, Non-skid slipper socks     Pants- Performed by patient: Thread/unthread right pants leg, Thread/unthread left pants leg, Pull pants up/down Pants- Performed by helper: Pull pants up/down Non-skid slipper socks- Performed by patient: Don/doff right sock, Don/doff left sock Non-skid slipper socks- Performed by helper: Don/doff left sock, Don/doff right sock   Socks - Performed by helper: Don/doff right sock, Don/doff left sock  Shoes - Performed by patient: Don/doff right shoe, Don/doff left shoe       TED Hose - Performed by patient: Don/doff right TED hose, Don/doff left TED hose TED Hose - Performed by helper: Don/doff right TED hose, Don/doff left TED hose  Lower body assist Assist for lower body dressing: Supervision or verbal cues      Toileting Toileting Toileting activity did not  occur: No continent bowel/bladder event Toileting steps completed by patient: Adjust clothing prior to toileting, Adjust clothing after toileting Toileting steps completed by helper: Adjust clothing prior to toileting, Performs perineal hygiene, Adjust clothing after toileting Toileting Assistive Devices: Other (comment) (bsc)  Toileting assist Assist level: Supervision or verbal cues   Transfers Chair/bed transfer   Chair/bed transfer method: Lateral scoot Chair/bed transfer assist level: Touching or steadying assistance (Pt > 75%) Chair/bed transfer assistive device: Armrests, Sliding board Mechanical lift: Maximove   Locomotion Ambulation Ambulation activity did not occur: Safety/medical concerns         Wheelchair   Type: Manual Max wheelchair distance: 200 ft Assist Level: No help, No cues, assistive device, takes more than reasonable amount of time  Cognition Comprehension Comprehension assist level: Follows complex conversation/direction with no assist  Expression Expression assist level: Expresses complex ideas: With no assist  Social Interaction Social Interaction assist level: Interacts appropriately with others - No medications needed.  Problem Solving Problem solving assist level: Solves complex problems: Recognizes & self-corrects  Memory Memory assist level: Complete Independence: No helper    Medical Problem List and Plan: 1.  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  Will see patient for hospital follow up in 1 month   Patient seen for a face to face for ultra lightweight manual wheelchair.  Reviewed and concur with PT eval for wheelchair.  Patient needs ultralight weight manual wheelchair to perform ADLs at home.   2.  DVT: LLE DVT - Xarelto started 3. Pain Management: complains of low back pain  -robaxin, tramadol prn  -needs Kpad---ordered but not  delivered  -Paraesthesias controlled on Neurontin tid and Mobic daily.  4. Mood: LCSW to follow for evaluation and support.  5. Neuropsych: This patient is capable of making decisions on her own behalf. 6. Skin/Wound Care: Educate on importance of boosting and pressure relief measures.  7. Fluids/Electrolytes/Nutrition: Monitor I/O.   BMP within acceptable range on 6/9 8. T2DM: new diagnosis with Hgb A1C-6.9 at admission.   Monitor BS ac/hs.    Educate patient on CM diet.   SSI for now  Overall controlled 6/13  Cont to monitor 9. Neurogenic bowel:    Adjusting meds further on 6/6  KUB reviewed showing stool burned - increased reg x1 - pt refused 10. Morbid obesity: Educate on appropriate diet. Will require bariatric equipment as well as skin monitoring to prevent breakdown.   11. Neurogenic bladder: foley d/ced with voiding trial  Low dose urecholine started 5/20, d/c'ed   Enalbrex d/ced  Confounded by UTI 12. Acute lower UTI  Ucx with Proteus and E.Coli  Resistant to macrobid, Keflex completed 5/19-5/26  UA+, Ucx Proteus  Cipro started 6/8-6/14 13. Hyponatremia: Resolved  Cont to monitor 14. Transaminitis: Resolved  Cont to monitor 15. ABLA  Hb 9.9 on 6/9  Cont to monitor 16. Hemorrhoids  Anusol ordered 5/24  Educated on scheduled dosage 17. Febrile; Resolved  See #12  Blood cultures remain pending  6/13  Improving  IVF d/ced  LOS (Days) 29 A FACE TO FACE EVALUATION WAS PERFORMED  Jack Bolio Lorie Phenix 01/27/2017 8:58 AM

## 2017-01-27 NOTE — Progress Notes (Signed)
Recreational Therapy Discharge Summary Patient Details  Name: Traci [name] MRN: [id number] Date of Birth: [birth year] Today's Date: 01/27/2017  Comments on progress toward goals: Pt is discharging to SNF for continued therapies and 24 hour care.  TR sessions have been focused on discharge planning, problem solving, identification & negotiation of obstacles. Reasons for discharge: discharge from hospital  Patient/family agrees with progress made and goals achieved: Yes  [name] 01/27/2017, 1:38 PM

## 2017-01-27 NOTE — Plan of Care (Signed)
Problem: RH Toileting Goal: LTG Patient will perform toileting w/assist, cues/equip (OT) LTG: Patient will perform toiletiing (clothes management/hygiene) with assist, with/without cues using equipment (OT)  Outcome: Not Met (add Reason) Needs mod to max assist

## 2017-01-27 NOTE — Plan of Care (Signed)
Problem: Food- and Nutrition-Related Knowledge Deficit (NB-1.1) Goal: Nutrition education Formal process to instruct or train a patient/client in a skill or to impart knowledge to help patients/clients voluntarily manage or modify food choices and eating behavior to maintain or improve health. Outcome: Completed/Met Date Met: 01/27/17  RD consulted for nutrition education regarding diabetes.   Lab Results  Component Value Date   HGBA1C 7.3 (H) 12/24/2016    RD provided "Carbohydrate Counting for People with Diabetes" handout from the Academy of [employer] Dietetics. Discussed different food groups and their effects on blood sugar, emphasizing carbohydrate-containing foods. Provided list of carbohydrates and recommended serving sizes of common foods.  Discussed importance of controlled and consistent carbohydrate intake throughout the day. Provided examples of ways to balance meals/snacks and encouraged intake of high-fiber, whole grain complex carbohydrates. Discussed diabetic friendly drink options.Teach back method used.  Expect good compliance.  Body mass index is 52.3 kg/m. Pt meets criteria for morbid obesity based on current BMI.  Current diet order is carbohydrate modifed, patient is consuming approximately 100% of meals at this time. Labs and medications reviewed. No further nutrition interventions warranted at this time. RD contact information provided. Plans for discharge today to SNF.  Corrin Parker, MS, RD, LDN Pager # [phone] After hours/ weekend pager # [phone]

## 2017-01-27 NOTE — Progress Notes (Signed)
Physical Therapy Discharge Summary  Patient Details  Name: Traci [name] MRN: [id number] Date of Birth: [birth year]  Today's Date: 01/27/2017      Patient has met 4 of 6 long term goals due to improved activity tolerance, improved balance, improved postural control and increased strength.  Patient to discharge at a wheelchair level [place].   Patient is going to SNF for further rehabilitation following CIR stay. No family present for education.  Reasons goals not met: Pt did not make anticipated progress in all areas. Pt continued to require assistance with dynamic sitting balance and not fully mod I for bed mobility as anticipated.   Recommendation:  Patient will benefit from ongoing skilled PT services in skilled nursing facility setting to continue to advance safe functional mobility, address ongoing impairments in transfers, balance, strengthening, general functional mobility and minimize fall risk.  Equipment: Pt did have w/c evaluation completed during her stay.  Reasons for discharge: Pt to be D/C to SNF for further rehabilitation needs to be addressed.   Patient/family agrees with progress made and goals achieved: Yes  PT Discharge Precautions/Restrictions Precautions Precautions: Fall Precaution Comments: BLE paraparesis Restrictions Weight Bearing Restrictions: No Vision/Perception  Vision - History Baseline Vision: Wears glasses all the time Patient Visual Report: No change from baseline Vision - Assessment Eye Alignment: Within Functional Limits Perception Perception: Within Functional Limits Praxis Praxis: Intact  Cognition Overall Cognitive Status: Within Functional Limits for tasks assessed Arousal/Alertness: Awake/alert Orientation Level: Oriented X4 Memory: Appears intact Awareness: Appears intact Problem Solving: Appears intact Safety/Judgment: Appears intact Sensation Sensation Light Touch: Impaired Detail Light Touch Impaired Details: Impaired  LLE;Impaired RLE Additional Comments: decreased bilateral LE sensation, absent in feet. Pt does confirm diminished sensation to mid tibia.  Coordination Gross Motor Movements are Fluid and Coordinated: Yes (through UEs) Fine Motor Movements are Fluid and Coordinated: Yes (through UEs) Coordination and Movement Description: intact in UEs, paraplegia in LEs Motor  Motor Motor - Discharge Observations: Paraplegia through bilateral LEs   Balance Balance Balance Assessed: Yes Static Sitting Balance Static Sitting - Level of Assistance: 5: Stand by assistance Dynamic Sitting Balance Dynamic Sitting - Level of Assistance: 5: Stand by assistance Extremity Assessment  RUE Assessment RUE Assessment: Within Functional Limits LUE Assessment LUE Assessment: Within Functional Limits RLE Assessment RLE Assessment: Exceptions to [facility] (not active motion, ROM grossly WFL) LLE Assessment LLE Assessment: Exceptions to [facility] (not active motion, WFL ROM)   See Function Navigator for Current Functional Status.  Linard Millers, PT 01/27/2017, 12:47 PM

## 2017-01-28 LAB — CULTURE, BLOOD (ROUTINE X 2)
CULTURE: NO GROWTH
CULTURE: NO GROWTH
Special Requests: ADEQUATE
Special Requests: ADEQUATE

## 2017-02-25 ENCOUNTER — Encounter: Payer: Self-pay | Admitting: Physical Medicine & Rehabilitation

## 2017-02-25 ENCOUNTER — Encounter
Payer: PRIVATE HEALTH INSURANCE | Attending: Physical Medicine & Rehabilitation | Admitting: Physical Medicine & Rehabilitation

## 2017-02-25 VITALS — BP 125/66 | HR 72

## 2017-02-25 DIAGNOSIS — I82492 Acute embolism and thrombosis of other specified deep vein of left lower extremity: Secondary | ICD-10-CM | POA: Diagnosis not present

## 2017-02-25 DIAGNOSIS — K592 Neurogenic bowel, not elsewhere classified: Secondary | ICD-10-CM | POA: Diagnosis not present

## 2017-02-25 DIAGNOSIS — G0491 Myelitis, unspecified: Secondary | ICD-10-CM

## 2017-02-25 DIAGNOSIS — I82452 Acute embolism and thrombosis of left peroneal vein: Secondary | ICD-10-CM

## 2017-02-25 DIAGNOSIS — G373 Acute transverse myelitis in demyelinating disease of central nervous system: Secondary | ICD-10-CM

## 2017-02-25 DIAGNOSIS — R269 Unspecified abnormalities of gait and mobility: Secondary | ICD-10-CM | POA: Diagnosis not present

## 2017-02-25 DIAGNOSIS — R208 Other disturbances of skin sensation: Secondary | ICD-10-CM | POA: Diagnosis not present

## 2017-02-25 DIAGNOSIS — G8220 Paraplegia, unspecified: Secondary | ICD-10-CM

## 2017-02-25 DIAGNOSIS — M792 Neuralgia and neuritis, unspecified: Secondary | ICD-10-CM | POA: Diagnosis not present

## 2017-02-25 DIAGNOSIS — N319 Neuromuscular dysfunction of bladder, unspecified: Secondary | ICD-10-CM | POA: Diagnosis not present

## 2017-02-25 DIAGNOSIS — I82402 Acute embolism and thrombosis of unspecified deep veins of left lower extremity: Secondary | ICD-10-CM | POA: Insufficient documentation

## 2017-02-25 DIAGNOSIS — E119 Type 2 diabetes mellitus without complications: Secondary | ICD-10-CM | POA: Diagnosis not present

## 2017-02-25 MED ORDER — GABAPENTIN 600 MG PO TABS: 600.0000 mg | ORAL_TABLET | Freq: Three times a day (TID) | ORAL | 3 refills | Status: DC

## 2017-02-25 NOTE — Progress Notes (Signed)
Subjective:    Patient ID: Traci [name], female    DOB: 15-Sep-1975, 41 y.o.   MRN: [id number]  HPI 41 y.o. female with history of morbid obesity, DDD presents for hospital follow up after receiving CIR for paraplegia secondary to transverse myelitis.   Admit date: 12/29/2016 Discharge date: 01/27/2017  At discharge, she was discharged to SNF due to lack of support.  She was instructed to do a bowel program, which she states has improved.  She completed her abx.  She states her voiding has improved.  Her sensation has also improved.  She has not seen Neurology.  She is not having her CBGs checked. She notes dysesthesias as her main complaint today. She never obtained her utralight weight wheelchair.   Pain Inventory Average Pain 7 Pain Right Now 7 My pain is burning and tingling  In the last 24 hours, has pain interfered with the following? General activity 6 Relation with others 0 Enjoyment of life 4 What TIME of day is your pain at its worst? evening Sleep (in general) Fair  Pain is worse with: bending and sitting Pain improves with: . Relief from Meds: 0  Mobility ability to climb steps?  no do you drive?  no use a wheelchair needs help with transfers  Function I need assistance with the following:  dressing, bathing, toileting, meal prep, household duties and shopping  Neuro/Psych bladder control problems bowel control problems weakness numbness tingling trouble walking dizziness  Prior Studies Any changes since last visit?  no  Physicians involved in your care Any changes since last visit?  no   Family History  Problem Relation Age of Onset  . Diabetes Mother   . Diabetes Father    Social History   Social History  . Marital status: Single    Spouse name: N/A  . Number of children: N/A  . Years of education: N/A   Social History Main Topics  . Smoking status: Never Smoker  . Smokeless tobacco: Never Used  . Alcohol use No  . Drug use: No  .  Sexual activity: Not Currently    Birth control/ protection: None   Other Topics Concern  . None   Social History Narrative  . None   Past Surgical History:  Procedure Laterality Date  . fibroid tumor    . KNEE ARTHROSCOPY    . TUBAL LIGATION     Past Medical History:  Diagnosis Date  . H/O degenerative disc disease   . Obesity    BP 125/66   Pulse 72   SpO2 98%   Opioid Risk Score:   Fall Risk Score:  `1  Depression screen PHQ 2/9  Depression screen [facility] 2/9 02/25/2017 12/17/2016 10/13/2016 09/29/2016  Decreased Interest 0 0 0 0  Down, Depressed, Hopeless 0 0 0 0  PHQ - 2 Score 0 0 0 0  Altered sleeping 3 - - -  Tired, decreased energy 3 - - -  Change in appetite 0 - - -  Feeling bad or failure about yourself  0 - - -  Trouble concentrating 0 - - -  Moving slowly or fidgety/restless 0 - - -  Suicidal thoughts 0 - - -  PHQ-9 Score 6 - - -  Difficult doing work/chores Somewhat difficult - - -     Review of Systems  HENT: Negative.   Eyes: Negative.   Respiratory: Negative.   Cardiovascular: Negative.   Gastrointestinal: Negative.   Endocrine: Negative.   Genitourinary: Negative.  Musculoskeletal: Positive for gait problem.  Skin: Negative.   Allergic/Immunologic: Negative.   Neurological: Positive for weakness and numbness.  Hematological: Negative.   Psychiatric/Behavioral: Negative.   All other systems reviewed and are negative.     Objective:   Physical Exam  Constitutional: She appears well-developed. No distress. Obese.  HENT: Normocephalic and atraumatic.  Eyes: EOMI. No discharge.  Cardiovascular: RRR. No JVD Respiratory: Normal effort. Clear. GI: Soft. Bowel sounds are normal.   Musculoskeletal: She exhibits no edema or tenderness.  Neurological: She is alert and oriented.  Speech clear.  Follows commands without difficulty.  Motor: B/l UE motor 5/5 prox to distal.  B/l LE: 0/5 HF, KE and ADF/PF (unchanged).  Sensation diminished to LT b/l  feet with allodynia in thighs and legs (slowly improving).  Skin: Skin is warm and dry. She is not diaphoretic.  Psychiatric: She has a normal mood and affect. Her speech is normal and behavior is normal. Judgment and thought content normal. Cognition and memory are normal    Assessment & Plan:  41 y.o. female with history of morbid obesity, DDD presents for hospital follow up after receiving CIR for paraplegia secondary to transverse myelitis.   1. 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  2.  LLE DVT   Cont Xarelto   3. Allodynia  Gabapentin increased to 600 TID  4. T2DM  Encouraged CBGs  5. Neurogenic bowel:    Cont bowel program  6. Neurogenic bladder  Cont timed voids  7. Morbid obesity  Encouraged pt to lose weight  8. Gait abnormality  Cont wheelchair for safety  Pt has not obtained ultralight weight.  Encouraged follow up once d/c from SNF  Meds reviewed Referrals reviewed - needs Neurology follow up All questions answered

## 2017-04-15 ENCOUNTER — Encounter: Payer: Self-pay | Admitting: Physical Medicine & Rehabilitation

## 2017-04-15 ENCOUNTER — Encounter
Payer: PRIVATE HEALTH INSURANCE | Attending: Physical Medicine & Rehabilitation | Admitting: Physical Medicine & Rehabilitation

## 2017-04-15 VITALS — BP 134/81 | HR 80 | Resp 18

## 2017-04-15 DIAGNOSIS — K592 Neurogenic bowel, not elsewhere classified: Secondary | ICD-10-CM | POA: Insufficient documentation

## 2017-04-15 DIAGNOSIS — R269 Unspecified abnormalities of gait and mobility: Secondary | ICD-10-CM | POA: Insufficient documentation

## 2017-04-15 DIAGNOSIS — I82492 Acute embolism and thrombosis of other specified deep vein of left lower extremity: Secondary | ICD-10-CM

## 2017-04-15 DIAGNOSIS — M792 Neuralgia and neuritis, unspecified: Secondary | ICD-10-CM | POA: Diagnosis not present

## 2017-04-15 DIAGNOSIS — N319 Neuromuscular dysfunction of bladder, unspecified: Secondary | ICD-10-CM | POA: Diagnosis not present

## 2017-04-15 DIAGNOSIS — E119 Type 2 diabetes mellitus without complications: Secondary | ICD-10-CM | POA: Insufficient documentation

## 2017-04-15 DIAGNOSIS — I82402 Acute embolism and thrombosis of unspecified deep veins of left lower extremity: Secondary | ICD-10-CM | POA: Diagnosis not present

## 2017-04-15 DIAGNOSIS — I82452 Acute embolism and thrombosis of left peroneal vein: Secondary | ICD-10-CM

## 2017-04-15 DIAGNOSIS — G8220 Paraplegia, unspecified: Secondary | ICD-10-CM | POA: Diagnosis not present

## 2017-04-15 DIAGNOSIS — G373 Acute transverse myelitis in demyelinating disease of central nervous system: Secondary | ICD-10-CM | POA: Diagnosis not present

## 2017-04-15 DIAGNOSIS — G0491 Myelitis, unspecified: Secondary | ICD-10-CM | POA: Diagnosis not present

## 2017-04-15 MED ORDER — GABAPENTIN 600 MG PO TABS: 1200.0000 mg | ORAL_TABLET | Freq: Three times a day (TID) | ORAL | 2 refills | Status: DC

## 2017-04-15 NOTE — Progress Notes (Signed)
Subjective:    Patient ID: Traci [name], female    DOB: 1975-12-22, 41 y.o.   MRN: [id number]  HPI 41 y.o. female with history of morbid obesity, DDD presents for follow up for paraplegia secondary to transverse myelitis.   Last clinic visit 02/25/17. Since that time, she notes she fell due to slipping with new bed sheets on her right knee. Xrays were performed, but she has not been informed of the results.  She is still at the SNF.  She has an appointment with Neurology in a couple of weeks. Her neuropathic pain is aggravating her.  The increase in Gabapentin did help her.  She has not been checking her CBGs.  She is regular with BMs. She is voiding.  She has not lost any weight.  She notes improvement in movement in her RLE.   Pain Inventory Average Pain 8 Pain Right Now 10 My pain is burning and tingling  In the last 24 hours, has pain interfered with the following? General activity 8 Relation with others 0 Enjoyment of life 5 What TIME of day is your pain at its worst? evening Sleep (in general) Poor  Pain is worse with: bending and sitting Pain improves with: N/A Relief from Meds: 2  Mobility ability to climb steps?  no do you drive?  no use a wheelchair needs help with transfers  Function I need assistance with the following:  dressing, bathing, toileting, meal prep, household duties and shopping  Neuro/Psych bladder control problems bowel control problems weakness numbness tingling trouble walking dizziness  Prior Studies Any changes since last visit?  no  Physicians involved in your care Any changes since last visit?  no   Family History  Problem Relation Age of Onset  . Diabetes Mother   . Diabetes Father    Social History   Social History  . Marital status: Single    Spouse name: N/A  . Number of children: N/A  . Years of education: N/A   Social History Main Topics  . Smoking status: Never Smoker  . Smokeless tobacco: Never Used  . Alcohol  use No  . Drug use: No  . Sexual activity: Not Currently    Birth control/ protection: None   Other Topics Concern  . None   Social History Narrative  . None   Past Surgical History:  Procedure Laterality Date  . fibroid tumor    . KNEE ARTHROSCOPY    . TUBAL LIGATION     Past Medical History:  Diagnosis Date  . H/O degenerative disc disease   . Obesity    BP 134/81   Pulse 80   Resp 18   SpO2 98%   Opioid Risk Score:   Fall Risk Score:  `1  Depression screen PHQ 2/9  Depression screen [facility] 2/9 02/25/2017 12/17/2016 10/13/2016 09/29/2016  Decreased Interest 0 0 0 0  Down, Depressed, Hopeless 0 0 0 0  PHQ - 2 Score 0 0 0 0  Altered sleeping 3 - - -  Tired, decreased energy 3 - - -  Change in appetite 0 - - -  Feeling bad or failure about yourself  0 - - -  Trouble concentrating 0 - - -  Moving slowly or fidgety/restless 0 - - -  Suicidal thoughts 0 - - -  PHQ-9 Score 6 - - -  Difficult doing work/chores Somewhat difficult - - -     Review of Systems  HENT: Negative.   Eyes: Negative.  Respiratory: Negative.   Cardiovascular: Negative.   Gastrointestinal: Negative.   Endocrine: Negative.   Genitourinary: Negative.   Musculoskeletal: Positive for gait problem.  Skin: Negative.   Allergic/Immunologic: Negative.   Neurological: Positive for weakness and numbness.  Hematological: Negative.   Psychiatric/Behavioral: Negative.   All other systems reviewed and are negative.     Objective:   Physical Exam  Constitutional: She appears well-developed. No distress. Obese.  HENT: Normocephalic and atraumatic.  Eyes: EOMI. No discharge.  Cardiovascular: RRR. No JVD Respiratory: Normal effort. Clear. GI: Soft. Bowel sounds are normal.   Musculoskeletal: She exhibits no edema or tenderness.  Neurological: She is alert and oriented.  Speech clear.  Follows commands without difficulty.  Motor: B/l UE motor 5/5 prox to distal.  RLE: 2/5 HF, 2-KE and 0/5 ADF/PF     LLE: 0/5 HF, KE and ADF/PF.  Sensation intact to light touch b/l LE.   Skin: Skin is warm and dry. She is not diaphoretic.  Psychiatric: She has a normal mood and affect. Her speech is normal and behavior is normal. Judgment and thought content normal. Cognition and memory are normal    Assessment & Plan:  41 y.o. female with history of morbid obesity, DDD presents for follow up for paraplegia secondary to transverse myelitis.   1. 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  LLE DVT   Cont Xarelto   3. Allodynia - improving  Gabapentin increased to 1200 TID  4. T2DM  Encouraged CBGs  5. Neurogenic bowel:    Cont bowel program  6. Neurogenic bladder  Cont timed voids  7. Morbid obesity  Cont to encouraged pt to lose weight  8. Gait abnormality  Cont wheelchair for safety  Pt has not obtained ultralight weight.  Encouraged follow up once completed rehab

## 2017-05-04 ENCOUNTER — Encounter: Payer: Self-pay | Admitting: Neurology

## 2017-05-04 ENCOUNTER — Ambulatory Visit (INDEPENDENT_AMBULATORY_CARE_PROVIDER_SITE_OTHER): Payer: PRIVATE HEALTH INSURANCE | Admitting: Neurology

## 2017-05-04 ENCOUNTER — Other Ambulatory Visit: Payer: PRIVATE HEALTH INSURANCE

## 2017-05-04 VITALS — BP 146/82 | HR 80 | Ht 60.0 in | Wt 281.0 lb

## 2017-05-04 DIAGNOSIS — M792 Neuralgia and neuritis, unspecified: Secondary | ICD-10-CM

## 2017-05-04 DIAGNOSIS — G8220 Paraplegia, unspecified: Secondary | ICD-10-CM

## 2017-05-04 DIAGNOSIS — R030 Elevated blood-pressure reading, without diagnosis of hypertension: Secondary | ICD-10-CM | POA: Diagnosis not present

## 2017-05-04 DIAGNOSIS — G373 Acute transverse myelitis in demyelinating disease of central nervous system: Secondary | ICD-10-CM

## 2017-05-04 MED ORDER — PREGABALIN 50 MG PO CAPS: 50.0000 mg | ORAL_CAPSULE | Freq: Three times a day (TID) | ORAL | 3 refills | Status: DC

## 2017-05-04 MED ORDER — GABAPENTIN 300 MG PO CAPS: 300.0000 mg | ORAL_CAPSULE | Freq: Three times a day (TID) | ORAL | 0 refills | Status: DC

## 2017-05-04 NOTE — Progress Notes (Signed)
NEUROLOGY CONSULTATION NOTE  Traci [name] MRN: [id number] DOB: [birth year]  Referring provider: Hospital referral Primary care provider: Merri Ray, MD ([facility])  Reason for consult:  Transverse myelitis  HISTORY OF PRESENT ILLNESS: Traci [name] is a 41 year old right-handed female presents for transverse myelitis.  MRIs personally reviewed.  History supplemented by hospital notes.  MRIs of brain, cervical and thoracic spine personally reviewed.  She was admitted to [facility] from 12/20/16 to 12/29/16 for transverse myelitis.  Earlier that week, she had a viral upper respiratory infection.  She presented with acute onset of bilateral lower extremity numbness and weakness that rapidly progressed over several hours to which she was paraplegic.  She also reported band-like pain acruss the back..  She has history of low back pain and was complaining of worsening back pain with radiating pain down the back of both legs.  Therefore, an MRI of the lumbar spine was performed, which demonstrated a disc herniation at L4-5 with mild canal stenosis, but otherwise unremarkable.  While findings explained the radicular pain, neurosurgery did not believe it as the cause of the numbness and weakness.  MRI of thoracic spine showed diffuse non-enhancing demyelination from mid thoracic cord to the cauda.  MRI of brain and cervical spine was unremarkable.  She underwent lumbar puncture for CSF analysis, which demonstrated cell count of 106, protein 36, glucose 92, IgG index 0.6 and no oligoclonal bands.  Serum labs included negative NMO-IgG, HIV, VZV PCR, CMV PCR, HSV 1&2 PCR, EBV PCR, anti Jo-1, ds DNA, ENA Sm antibody, SSA/SSB antibodies, and scleroderma antibody.  B12 was 1,561, copper 170 and zinc was 65.  She underwent SoluMedrol 1mg  daily for 5 days.  She developed steroid-induced hyperglycemia and was started on an insulin sliding scale.  Hgb A1c was initially 6.9.  She was  subsequently discharged to inpatient rehab, where she stayed until 01/27/17.  She subsequently developed left lower extremity DVT and is on Xarelto.  She was discharged to inpatient rehab.  Current symptoms: Paraplegia Neuropathic pain, from the waist down.  She is taking gabapentin 1200mg  three times daily which is ineffective. Neurogenic bowel/incontinence Neurogenic bladder/incontinence.  She has no sensation of urgency.  Wears diaper.  She denies history of visual disturbance to suggest optic neuritis.  She denies prior neurologic symptoms.    She does not have family history of MS.  However, her brother is disabled due to myelopathy secondary to B12 deficiency.  Her father and paternal aunt have B12 deficiency.  She is currently at a SNF.  PAST MEDICAL HISTORY: Past Medical History:  Diagnosis Date  . H/O degenerative disc disease   . Obesity     PAST SURGICAL HISTORY: Past Surgical History:  Procedure Laterality Date  . fibroid tumor    . KNEE ARTHROSCOPY    . TUBAL LIGATION      MEDICATIONS: Current Outpatient Prescriptions on File Prior to Visit  Medication Sig Dispense Refill  . meloxicam (MOBIC) 15 MG tablet Take 1 tablet (15 mg total) by mouth daily.    . methocarbamol (ROBAXIN) 500 MG tablet Take 1 tablet (500 mg total) by mouth every 6 (six) hours as needed for muscle spasms.    . polyethylene glycol (MIRALAX / GLYCOLAX) packet Take 17 g by mouth daily. Adjust to twice a day once antibiotics discontinued. (Patient not taking: Reported on 05/04/2017) 14 each 0  . rivaroxaban (XARELTO) 20 MG TABS tablet Take 1 tablet (20 mg total) by mouth  daily with supper. (Patient not taking: Reported on 05/04/2017) 30 tablet   . senna-docusate (SENOKOT-S) 8.6-50 MG tablet Take 2 tablets by mouth at bedtime. Increase to twice a day once antibiotics discontinued. (Patient not taking: Reported on 05/04/2017)    . sodium phosphate (FLEET) 7-19 GM/118ML ENEM Place 133 mLs (1 enema total)  rectally daily at 6 (six) AM. (Patient not taking: Reported on 05/04/2017)  0  . traMADol (ULTRAM) 50 MG tablet Take 1 tablet (50 mg total) by mouth every 4 (four) hours as needed for severe pain. 15 tablet 0   No current facility-administered medications on file prior to visit.     ALLERGIES: No Known Allergies  FAMILY HISTORY: Family History  Problem Relation Age of Onset  . Diabetes Mother   . Diabetes Father     SOCIAL HISTORY: Social History   Social History  . Marital status: Single    Spouse name: N/A  . Number of children: 3  . Years of education: 12   Occupational History  . cook    Social History Main Topics  . Smoking status: Never Smoker  . Smokeless tobacco: Never Used  . Alcohol use No  . Drug use: No  . Sexual activity: Not Currently    Birth control/ protection: None   Other Topics Concern  . Not on file   Social History Narrative   Seperated, currently in [facility].    REVIEW OF SYSTEMS: Constitutional: No fevers, chills, or sweats, no generalized fatigue, change in appetite Eyes: No visual changes, double vision, eye pain Ear, nose and throat: No hearing loss, ear pain, nasal congestion, sore throat Cardiovascular: No chest pain, palpitations Respiratory:  No shortness of breath at rest or with exertion, wheezes GastrointestinaI: No nausea, vomiting, diarrhea, abdominal pain, fecal incontinence Genitourinary:  No dysuria, urinary retention or frequency Musculoskeletal:  No neck pain, back pain Integumentary: No rash, pruritus, skin lesions Neurological: as above Psychiatric: No depression, insomnia, anxiety Endocrine: No palpitations, fatigue, diaphoresis, mood swings, change in appetite, change in weight, increased thirst Hematologic/Lymphatic:  No purpura, petechiae. Allergic/Immunologic: no itchy/runny eyes, nasal congestion, recent allergic reactions, rashes  PHYSICAL EXAM: Vitals:   05/04/17 0807  BP: (!) 146/82    Pulse: 80  SpO2: 98%   General: No acute distress.  Patient appears well-groomed.  Head:  Normocephalic/atraumatic Eyes:  fundi examined but not visualized Neck: supple, no paraspinal tenderness, full range of motion Back: No paraspinal tenderness Heart: regular rate and rhythm Lungs: Clear to auscultation bilaterally. Vascular: No carotid bruits. Neurological Exam: Mental status: alert and oriented to person, place, and time, recent and remote memory intact, fund of knowledge intact, attention and concentration intact, speech fluent and not dysarthric, language intact. Cranial nerves: CN I: not tested CN II: pupils equal, round and reactive to light, visual fields intact CN III, IV, VI:  full range of motion, no nystagmus, no ptosis CN V: facial sensation intact CN VII: upper and lower face symmetric CN VIII: hearing intact CN IX, X: gag intact, uvula midline CN XI: sternocleidomastoid and trapezius muscles intact CN XII: tongue midline Bulk & Tone: normal, no fasciculations. Motor:  2/5 right quad, otherwise plegic in lower extremities.  5/5 upper extremities Sensation:  Pinprick sensation reduced up to mid thigh bilaterally and vibration sensation reduced up to knees.. Deep Tendon Reflexes:  1+ upper extremities, 0 lower extremities, toes equivocal Finger to nose testing:  Without dysmetria.  Heel to shin:  Unable to test Gait:  Wheelchair-bound  IMPRESSION: 1.  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  While history and CSF suggests transverse myelitis, NMO is still possible.  The extent of the thoracic cord demyelination more correlates with NMO.  CSF results may be seen in NMO as well.  Even though NMO antibody IgG was negative, there are sometimes false negative results. 2.  Elevated blood pressure  PLAN: 1.  I would repeat NMO antibody IgG and check an ACE as well 2.  Gabapentin ineffective for her neuropathic pain.  Will taper off gabapentin and start Lyrica 50mg   three times daily 3.  Continue supportive care 4.  Blood pressure should be rechecked with PCP 5.  Follow up in 4 months.  Thank you for allowing me to take part in the care of this patient.  Metta Clines, DO  CC:  Delice Lesch, MD  Merri Ray, MD

## 2017-05-04 NOTE — Patient Instructions (Addendum)
1.  Recheck NMO antibodies and ACE level 2.  Start Lyrica 50mg  three times daily 3.  We will taper off gabapentin.  Take 600mg  three times daily for 7 days          Then 300mg  three times daily (I will prescribe one week of 300mg  pills)          Then stop 4.  I would continue physical therapy 5.  Follow up in 4 months.

## 2017-05-05 LAB — NEUROMYELITIS OPTICA AUTOAB, IGG: NMO IgG Autoantibodies: <1.5 U/mL (ref 0.0–3.0)

## 2017-05-05 LAB — ANGIOTENSIN CONVERTING ENZYME: Angiotensin-Converting Enzyme: 28 U/L (ref 9–67)

## 2017-05-06 ENCOUNTER — Telehealth: Payer: Self-pay

## 2017-05-06 NOTE — Telephone Encounter (Signed)
-----   Message from Pieter Partridge, DO sent at 05/06/2017  7:09 AM EDT ----- Blood tests are negative

## 2017-05-06 NOTE — Telephone Encounter (Signed)
LM to rtrn call

## 2017-05-11 ENCOUNTER — Telehealth: Payer: Self-pay

## 2017-05-11 NOTE — Telephone Encounter (Signed)
Spoke with Traci [name], advsd her of lab results. Traci [name] said the home she is in has not tapered her off the gabapentin and started her on the Lyrica, though she has asked them about it several times and it was on her orders. She was told by the nursing staff it was not on the orders. I called [facility], I asked to speak with the Pts nurse, I was put through to Margy Clarks who introduced herself as a Librarian, academic. I explained the events, Ms Luan Pulling apologized and said her only explanation was she had had several "changing of the guards" of late. She asked I fax the AVS to her and she will ensure the orders are followed

## 2017-05-11 NOTE — Telephone Encounter (Signed)
-----   Message from Pieter Partridge, DO sent at 05/06/2017  7:09 AM EDT ----- Blood tests are negative

## 2017-05-11 NOTE — Telephone Encounter (Signed)
Margy Clarks called, said she did not receive all pages of fax concerning instructions on tapering gabapentin, gave her verbal instructions. She asked what Pt was to do for pain in the meantime, I advised her the gabapentin was being d/c due to lack of efficacy and had they not dropped the ball, the patient would be closer to being able to start Lyrica. I checked with Dr Tomi Likens if there was anything in addition we couold offer, he did not recommend anything additionally until Lyrica is started and we see how she does with that.

## 2017-06-17 ENCOUNTER — Encounter: Payer: PRIVATE HEALTH INSURANCE | Admitting: Physical Medicine & Rehabilitation

## 2017-07-01 ENCOUNTER — Encounter: Payer: PRIVATE HEALTH INSURANCE | Admitting: Physical Medicine & Rehabilitation

## 2017-09-03 ENCOUNTER — Ambulatory Visit (INDEPENDENT_AMBULATORY_CARE_PROVIDER_SITE_OTHER): Payer: PRIVATE HEALTH INSURANCE | Admitting: Neurology

## 2017-09-03 ENCOUNTER — Encounter: Payer: Self-pay | Admitting: Neurology

## 2017-09-03 VITALS — BP 120/84 | HR 87 | Resp 12 | Ht 60.0 in

## 2017-09-03 DIAGNOSIS — M792 Neuralgia and neuritis, unspecified: Secondary | ICD-10-CM | POA: Diagnosis not present

## 2017-09-03 DIAGNOSIS — G8220 Paraplegia, unspecified: Secondary | ICD-10-CM

## 2017-09-03 DIAGNOSIS — G373 Acute transverse myelitis in demyelinating disease of central nervous system: Secondary | ICD-10-CM | POA: Diagnosis not present

## 2017-09-03 MED ORDER — PREGABALIN 100 MG PO CAPS: 100.0000 mg | ORAL_CAPSULE | Freq: Three times a day (TID) | ORAL | 5 refills | Status: DC

## 2017-09-03 NOTE — Patient Instructions (Signed)
1.  I have increased the Lyrica to 100mg  three times daily.  Hopefully, the pain will be under better control 2.  Continue daily exercises 3.  Follow up in 6 months.

## 2017-09-03 NOTE — Progress Notes (Signed)
NEUROLOGY FOLLOW UP OFFICE NOTE  Traci [name] [id number]  HISTORY OF PRESENT ILLNESS: Traci [name] is a 42 year old right-handed female who follows up for transverse myelitis.    UPDATE: Repeat NMO IgG antibodies from 05/04/17 was negative.  ACE level was 28. Gabapentin tapered off and started on Lyrica 50mg  three times daily.  Pain is still present but improved.  She notes some increased strength in the right leg.  She sometimes has sensation of urinary urgency.  She exercises daily.   HISTORY: She was admitted to [facility] from 12/20/16 to 12/29/16 for transverse myelitis.  Earlier that week, she had a viral upper respiratory infection.  She presented with acute onset of bilateral lower extremity numbness and weakness that rapidly progressed over several hours to which she was paraplegic.  She also reported band-like pain acruss the back..  She has history of low back pain and was complaining of worsening back pain with radiating pain down the back of both legs.  Therefore, an MRI of the lumbar spine was performed, which demonstrated a disc herniation at L4-5 with mild canal stenosis, but otherwise unremarkable.  While findings explained the radicular pain, neurosurgery did not believe it as the cause of the numbness and weakness.  MRI of thoracic spine showed diffuse non-enhancing demyelination from mid thoracic cord to the cauda.  MRI of brain and cervical spine was unremarkable.  She underwent lumbar puncture for CSF analysis, which demonstrated cell count of 106, protein 36, glucose 92, IgG index 0.6 and no oligoclonal bands.  Serum labs included negative NMO-IgG, HIV, VZV PCR, CMV PCR, HSV 1&2 PCR, EBV PCR, anti Jo-1, ds DNA, ENA Sm antibody, SSA/SSB antibodies, and scleroderma antibody.  B12 was 1,561, copper 170 and zinc was 65.  She underwent SoluMedrol 1mg  daily for 5 days.  She developed steroid-induced hyperglycemia and was started on an insulin sliding scale.  Hgb A1c was  initially 6.9.  She was subsequently discharged to inpatient rehab, where she stayed until 01/27/17.  She subsequently developed left lower extremity DVT and is on Xarelto.  She was discharged to inpatient rehab.   Current symptoms: Paraplegia Neuropathic pain, from the waist down.  She is taking gabapentin 1200mg  three times daily which is ineffective. Neurogenic bowel/incontinence Neurogenic bladder/incontinence.  She has no sensation of bowel urgency and occasionally has some sensation of urinary urgency.  Wears diaper.   She denies history of visual disturbance to suggest optic neuritis.  She denies prior neurologic symptoms.     She does not have family history of MS.  However, her brother is disabled due to myelopathy secondary to B12 deficiency.  Her father and paternal aunt have B12 deficiency.   She is currently at a SNF[facility].  PAST MEDICAL HISTORY: Past Medical History:  Diagnosis Date  . H/O degenerative disc disease   . Obesity     MEDICATIONS: Current Outpatient Medications on File Prior to Visit  Medication Sig Dispense Refill  . cholecalciferol (VITAMIN D) 1000 units tablet Take 2,000 Units by mouth daily.    . famotidine (PEPCID) 20 MG tablet Take 20 mg by mouth 2 (two) times daily.    . polyethylene glycol (MIRALAX / GLYCOLAX) packet Take 17 g by mouth daily. Adjust to twice a day once antibiotics discontinued. 14 each 0  . rivaroxaban (XARELTO) 20 MG TABS tablet Take 1 tablet (20 mg total) by mouth daily with supper. 30 tablet   . senna-docusate (SENOKOT-S) 8.6-50 MG tablet Take 2  tablets by mouth at bedtime. Increase to twice a day once antibiotics discontinued.    Marland Kitchen acetaminophen (TYLENOL) 500 MG tablet Take 500 mg by mouth every 4 (four) hours as needed.    . gabapentin (NEURONTIN) 300 MG capsule Take 1 capsule (300 mg total) by mouth 3 (three) times daily. (Patient not taking: Reported on 09/03/2017) 21 capsule 0  . meloxicam (MOBIC) 15 MG tablet Take 1  tablet (15 mg total) by mouth daily.    . methocarbamol (ROBAXIN) 500 MG tablet Take 1 tablet (500 mg total) by mouth every 6 (six) hours as needed for muscle spasms.    . sodium phosphate (FLEET) 7-19 GM/118ML ENEM Place 133 mLs (1 enema total) rectally daily at 6 (six) AM. (Patient not taking: Reported on 05/04/2017)  0  . traMADol (ULTRAM) 50 MG tablet Take 1 tablet (50 mg total) by mouth every 4 (four) hours as needed for severe pain. 15 tablet 0   No current facility-administered medications on file prior to visit.     ALLERGIES: No Known Allergies  FAMILY HISTORY: Family History  Problem Relation Age of Onset  . Diabetes Mother   . Diabetes Father     SOCIAL HISTORY: Social History   Socioeconomic History  . Marital status: Single    Spouse name: Not on file  . Number of children: 3  . Years of education: 81  . Highest education level: Not on file  Social Needs  . Financial resource strain: Not on file  . Food insecurity - worry: Not on file  . Food insecurity - inability: Not on file  . Transportation needs - medical: Not on file  . Transportation needs - non-medical: Not on file  Occupational History  . Occupation: cook  Tobacco Use  . Smoking status: Never Smoker  . Smokeless tobacco: Never Used  Substance and Sexual Activity  . Alcohol use: No  . Drug use: No  . Sexual activity: Not Currently    Birth control/protection: None  Other Topics Concern  . Not on file  Social History Narrative   Seperated, currently in [facility].    REVIEW OF SYSTEMS: Constitutional: No fevers, chills, or sweats, no generalized fatigue, change in appetite Eyes: No visual changes, double vision, eye pain Ear, nose and throat: No hearing loss, ear pain, nasal congestion, sore throat Cardiovascular: No chest pain, palpitations Respiratory:  No shortness of breath at rest or with exertion, wheezes GastrointestinaI: as above Genitourinary:  As  above Musculoskeletal:  No neck pain, back pain Integumentary: No rash, pruritus, skin lesions Neurological: as above Psychiatric: No depression, insomnia, anxiety Endocrine: No palpitations, fatigue, diaphoresis, mood swings, change in appetite, change in weight, increased thirst Hematologic/Lymphatic:  No purpura, petechiae. Allergic/Immunologic: no itchy/runny eyes, nasal congestion, recent allergic reactions, rashes  PHYSICAL EXAM: Vitals:   09/03/17 0913  BP: 120/84  Pulse: 87  Resp: 12  SpO2: 98%   General: No acute distress.  Patient appears well-groomed.   Head:  Normocephalic/atraumatic Neurological Exam: Mental status: alert and oriented to person, place, and time, recent and remote memory intact, fund of knowledge intact, attention and concentration intact, speech fluent and not dysarthric, language intact. Cranial nerves:  II-XII intact.  Bulk & Tone: decreased tone, no fasciculations.  Motor:  2/5 right hip flexion and quad, 1/5 left hip flexion, otherwise plegic in lower extremities.  5/5 upper extremities.  Sensation:  temperature sensation reduced up to mid thigh bilaterally and vibration sensation reduced up to knees.  Deep Tendon Reflexes:  1+ upper extremities, 0 lower extremities, toes equivocal.  Finger to nose testing:  Without dysmetria. Heel to shin:  Unable to test.  Gait:  Wheelchair-bound  IMPRESSION: Transverse myelitis Morbid obesity  PLAN: 1.  Increase Lyrica to 100mg  three times daily 2.  Continue daily exercise.  Work on weight loss 3.  Follow up in 6 months.  25 minutes spent face to face with patient, over 50% spent discussing management.  Metta Clines, DO  CC:  Merri Ray, MD

## 2017-12-13 ENCOUNTER — Encounter: Payer: Self-pay | Admitting: Neurology

## 2018-03-03 ENCOUNTER — Ambulatory Visit (INDEPENDENT_AMBULATORY_CARE_PROVIDER_SITE_OTHER): Payer: Medicaid Other | Admitting: Neurology

## 2018-03-03 ENCOUNTER — Encounter: Payer: Self-pay | Admitting: Neurology

## 2018-03-03 VITALS — BP 142/96 | HR 74 | Ht 60.0 in | Wt 284.0 lb

## 2018-03-03 DIAGNOSIS — G373 Acute transverse myelitis in demyelinating disease of central nervous system: Secondary | ICD-10-CM | POA: Diagnosis not present

## 2018-03-03 DIAGNOSIS — M792 Neuralgia and neuritis, unspecified: Secondary | ICD-10-CM | POA: Diagnosis not present

## 2018-03-03 DIAGNOSIS — G8220 Paraplegia, unspecified: Secondary | ICD-10-CM

## 2018-03-03 NOTE — Patient Instructions (Signed)
1.  I would like to increase Lyrica to 150mg  three times daily 2.  Follow up in 9 months

## 2018-03-03 NOTE — Progress Notes (Signed)
NEUROLOGY FOLLOW UP OFFICE NOTE  Traci [name] [id number]  HISTORY OF PRESENT ILLNESS: Traci [name] is a 42 year old right-handed female who follows up for transverse myelitis.     UPDATE: She is taking Lyrica 100mg  three times daily.  Pain is overall manageable.  On days that she is in greater discomfort, she takes a tramadol.  It is more effective than gabapentin.  She resides in the nursing home.  She participates in physical therapy.  She denies depression.   HISTORY: She was admitted to [facility] from 12/20/16 to 12/29/16 for transverse myelitis.  Earlier that week, she had a viral upper respiratory infection.  She presented with acute onset of bilateral lower extremity numbness and weakness that rapidly progressed over several hours to which she was paraplegic.  She also reported band-like pain acruss the back..  She has history of low back pain and was complaining of worsening back pain with radiating pain down the back of both legs.  Therefore, an MRI of the lumbar spine was performed, which demonstrated a disc herniation at L4-5 with mild canal stenosis, but otherwise unremarkable.  While findings explained the radicular pain, neurosurgery did not believe it as the cause of the numbness and weakness.  MRI of thoracic spine showed diffuse non-enhancing demyelination from mid thoracic cord to the cauda.  MRI of brain and cervical spine was unremarkable.  She underwent lumbar puncture for CSF analysis, which demonstrated cell count of 106, protein 36, glucose 92, IgG index 0.6 and no oligoclonal bands.  Serum labs included negative NMO-IgG, HIV, VZV PCR, CMV PCR, HSV 1&2 PCR, EBV PCR, anti Jo-1, ds DNA, ENA Sm antibody, SSA/SSB antibodies, and scleroderma antibody.  B12 was 1,561, copper 170 and zinc was 65.  She underwent SoluMedrol 1mg  daily for 5 days.  She developed steroid-induced hyperglycemia and was started on an insulin sliding scale.  Hgb A1c was initially 6.9.  She was  subsequently discharged to inpatient rehab, where she stayed until 01/27/17.  She subsequently developed left lower extremity DVT and is on Xarelto.  She was discharged to inpatient rehab.  Repeat NMO IgG antibodies from 05/04/17 was negative.  ACE level was 28.   Current symptoms: Paraplegia Neuropathic pain, from the waist down.  She is taking gabapentin 1200mg  three times daily which is ineffective. Neurogenic bowel/incontinence Neurogenic bladder/incontinence.  She has no sensation of bowel urgency and occasionally has some sensation of urinary urgency.  Wears diaper.   She denies history of visual disturbance to suggest optic neuritis.  She denies prior neurologic symptoms.     She does not have family history of MS.  However, her brother is disabled due to myelopathy secondary to B12 deficiency.  Her father and paternal aunt have B12 deficiency.   She is currently at a SNF, [facility].  Past medication:  gabapentin  PAST MEDICAL HISTORY: Past Medical History:  Diagnosis Date  . H/O degenerative disc disease   . Obesity     MEDICATIONS: Current Outpatient Medications on File Prior to Visit  Medication Sig Dispense Refill  . acetaminophen (TYLENOL) 500 MG tablet Take 500 mg by mouth every 4 (four) hours as needed.    . cholecalciferol (VITAMIN D) 1000 units tablet Take 2,000 Units by mouth daily.    . famotidine (PEPCID) 20 MG tablet Take 20 mg by mouth 2 (two) times daily.    Marland Kitchen gabapentin (NEURONTIN) 300 MG capsule Take 1 capsule (300 mg total) by mouth 3 (three) times daily. (  Patient not taking: Reported on 09/03/2017) 21 capsule 0  . meloxicam (MOBIC) 15 MG tablet Take 1 tablet (15 mg total) by mouth daily.    . methocarbamol (ROBAXIN) 500 MG tablet Take 1 tablet (500 mg total) by mouth every 6 (six) hours as needed for muscle spasms.    . polyethylene glycol (MIRALAX / GLYCOLAX) packet Take 17 g by mouth daily. Adjust to twice a day once antibiotics discontinued. 14 each 0  .  rivaroxaban (XARELTO) 20 MG TABS tablet Take 1 tablet (20 mg total) by mouth daily with supper. 30 tablet   . senna-docusate (SENOKOT-S) 8.6-50 MG tablet Take 2 tablets by mouth at bedtime. Increase to twice a day once antibiotics discontinued.    . sodium phosphate (FLEET) 7-19 GM/118ML ENEM Place 133 mLs (1 enema total) rectally daily at 6 (six) AM. (Patient not taking: Reported on 05/04/2017)  0  . traMADol (ULTRAM) 50 MG tablet Take 1 tablet (50 mg total) by mouth every 4 (four) hours as needed for severe pain. 15 tablet 0  . [DISCONTINUED] pregabalin (LYRICA) 100 MG capsule Take 1 capsule (100 mg total) by mouth 3 (three) times daily. 90 capsule 5   No current facility-administered medications on file prior to visit.     ALLERGIES: No Known Allergies  FAMILY HISTORY: Family History  Problem Relation Age of Onset  . Diabetes Mother   . Diabetes Father     SOCIAL HISTORY: Social History   Socioeconomic History  . Marital status: Single    Spouse name: Not on file  . Number of children: 3  . Years of education: 4  . Highest education level: Not on file  Occupational History  . Occupation: Training and development officer  Social Needs  . Financial resource strain: Not on file  . Food insecurity:    Worry: Not on file    Inability: Not on file  . Transportation needs:    Medical: Not on file    Non-medical: Not on file  Tobacco Use  . Smoking status: Never Smoker  . Smokeless tobacco: Never Used  Substance and Sexual Activity  . Alcohol use: No  . Drug use: No  . Sexual activity: Not Currently    Birth control/protection: None  Lifestyle  . Physical activity:    Days per week: Not on file    Minutes per session: Not on file  . Stress: Not on file  Relationships  . Social connections:    Talks on phone: Not on file    Gets together: Not on file    Attends religious service: Not on file    Active member of club or organization: Not on file    Attends meetings of clubs or organizations:  Not on file    Relationship status: Not on file  . Intimate partner violence:    Fear of current or ex partner: Not on file    Emotionally abused: Not on file    Physically abused: Not on file    Forced sexual activity: Not on file  Other Topics Concern  . Not on file  Social History Narrative   Seperated, currently in [facility].    REVIEW OF SYSTEMS: Constitutional: No fevers, chills, or sweats, no generalized fatigue, change in appetite Eyes: No visual changes, double vision, eye pain Ear, nose and throat: No hearing loss, ear pain, nasal congestion, sore throat Cardiovascular: No chest pain, palpitations Respiratory:  No shortness of breath at rest or with exertion, wheezes GastrointestinaI: No nausea,  vomiting, diarrhea, abdominal pain, fecal incontinence Genitourinary:  No dysuria, urinary retention or frequency Musculoskeletal:  No neck pain, back pain Integumentary: No rash, pruritus, skin lesions Neurological: as above Psychiatric: No depression, insomnia, anxiety Endocrine: No palpitations, fatigue, diaphoresis, mood swings, change in appetite, change in weight, increased thirst Hematologic/Lymphatic:  No purpura, petechiae. Allergic/Immunologic: no itchy/runny eyes, nasal congestion, recent allergic reactions, rashes  PHYSICAL EXAM: Vitals:   03/03/18 0940  BP: (!) 142/96  Pulse: 74  SpO2: 98%   General: No acute distress.  Patient appears well-groomed.  Morbidly obese body habitus. Head:  Normocephalic/atraumatic Eyes:  Fundi examined but not visualized Neck: supple, no paraspinal tenderness, full range of motion Heart:  Regular rate and rhythm Lungs:  Clear to auscultation bilaterally Back: No paraspinal tenderness Neurological Exam:Mental status: alert and oriented to person, place, and time, recent and remote memory intact, fund of knowledge intact, attention and concentration intact, speech fluent and not dysarthric, language intact.  Cranial  nerves:  II-XII intact.  Bulk & Tone: decreased tone, no fasciculations.  Motor:  2/5 right hip flexion and quad, 1/5 left hip flexion, otherwise plegic in lower extremities.  5/5 upper extremities.  Sensation:  temperature sensation reduced up to mid thigh bilaterally and vibration sensation reduced up to knees.  Deep Tendon Reflexes:  1+ upper extremities, 0 lower extremities, toes equivocal.  Finger to nose testing:  Without dysmetria. Heel to shin:  Unable to test.  Gait:  Wheelchair-bound  IMPRESSION: Transverse myelitis with residual paraplegia Morbid obesity (BMI 55.46 kg/m2)  PLAN: 1.  Increase Lyrica to 150mg  three times daily 2.  Weight loss 3.  Follow up in 9 months.  19 minutes spent face to face with patient, over 50% spent discussing management.  Metta Clines, DO  CC:  Merri Ray, MD

## 2018-05-25 DIAGNOSIS — Z0271 Encounter for disability determination: Secondary | ICD-10-CM

## 2018-08-31 ENCOUNTER — Telehealth: Payer: Self-pay | Admitting: Hematology

## 2018-08-31 NOTE — Telephone Encounter (Signed)
New referral received from [facility] at [facility] for the pt to see a hematologist w/dx of anemia. Pt has been scheduled to see Dr. Irene Limbo on 1/30 at 10am. Lennette Bihari is aware the pt should arrive 30 minutes early to be checked in on time.

## 2018-09-07 ENCOUNTER — Other Ambulatory Visit (HOSPITAL_COMMUNITY): Payer: Self-pay | Admitting: *Deleted

## 2018-09-08 ENCOUNTER — Ambulatory Visit (HOSPITAL_COMMUNITY)
Admission: RE | Admit: 2018-09-08 | Discharge: 2018-09-08 | Disposition: A | Discharge status: Home / Self Care | Payer: Medicaid Other | Source: Ambulatory Visit | Attending: Internal Medicine | Admitting: Internal Medicine

## 2018-09-08 DIAGNOSIS — D62 Acute posthemorrhagic anemia: Secondary | ICD-10-CM | POA: Insufficient documentation

## 2018-09-08 LAB — ABO/RH: ABO/RH(D): B POS

## 2018-09-08 LAB — PREPARE RBC (CROSSMATCH)

## 2018-09-08 MED ORDER — SODIUM CHLORIDE 0.9% IV SOLUTION: Freq: Once | INTRAVENOUS | Status: DC

## 2018-09-09 LAB — TYPE AND SCREEN
ABO/RH(D): B POS
Antibody Screen: NEGATIVE
Unit division: 0
Unit division: 0

## 2018-09-09 LAB — BPAM RBC
BLOOD PRODUCT EXPIRATION DATE: 202002172359
Blood Product Expiration Date: 202002172359
ISSUE DATE / TIME: 202001230939
ISSUE DATE / TIME: 202001231242
Unit Type and Rh: 7300
Unit Type and Rh: 7300

## 2018-09-14 NOTE — Progress Notes (Signed)
HEMATOLOGY/ONCOLOGY CONSULTATION NOTE  Date of Service: 09/15/2018  Patient Care Team: Wendie Agreste, MD as PCP - General (Family Medicine)  CHIEF COMPLAINTS/PURPOSE OF CONSULTATION:  Anemia  HISTORY OF PRESENTING ILLNESS:   Traci [name] is a wonderful 43 y.o. female who has been referred to [place] by Dr. Merri Ray for evaluation and management of anemia. Her PCP is Dr. Delfina Redwood and her Neurologist is Dr. Tomi Likens. The pt reports that she is doing well overall.  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 She denies having a hx of anemia prior to being diagnosed in October 2019. She was not feeling fatigued when her Hgb was low in October. She denies heavy cycles, however, notes that she has irregular menstrual cycles. When she does have menstrual cycles they last 2-3 days with only the first day being heavy. She is no longer on meloxicam at this time.  She notes that she was sick recently and lost 40 lbs due to recurrent UTIs. She is not able to feel that her bladder is full, however, she wasn't diagnosed with neurogenic bladder. She is taking cranberry and probiotics pill BID, catheter twice a day, and following up with an urologist. She has had recurrent UTIs at least 1 every month. She is also on an iron pill and she has been on it for about 2.5 months at this time, this was started after her initial diagnosis. She is taking vitamin D as well. Additionally, she is taking Lyrica TID and senakot BID. She denies kidney issues.   The pt denies any new symptoms or changes in function over the past 6 months. She had skin breakdown to her left lateral lower extremity, due to wearing a boot, however, this has resolved. She denies issues with bed sores, leg swelling, recent infection, melena, blood in stools, bowel issues, joint pain/swelling, skin rashes, mouth sores, bone pain, and any other symptoms.   Most recent lab results  (07/26/18) of CBC w/diff is as follows: all values are WNL except for WBC at 11.9k, RBC at 2.81, HGB at 6.6, HCT at 20.6, MCV at 73.5, MCH at 23.4, RDW at 16.9, ANC at 7.4k. 08/03/18 Haptoglobin at 300 07/27/18 Ferritin at 322.5 07/27/18 Iron & TIBC revealed all values WNL except for Iron at 22, TIBC at 189, Transferrin saturation at 11.58 05/27/18 Hemoglobinopathy evaluation revealed HgbA at 98.1, HgbA2 at 1.9 05/27/2018, Hgb at 11.   On review of systems, pt reports she felt better after receiving the blood transfusion, however, she notes that it started her menstrual cycle, and denies abdominal pain, enlarged lymph nodes, back pain, and any other symptoms.   On PMHx the pt reports transverse myelitis, anemia x 3 months, left DVT on xarelto x 6 months (off now), sleep apnea (no sleep study yet). 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  On Family Hx the pt denies blood disorders in her family. She does have a family hx of cancer, with her extended family with cancers (uncle with leukemia and her cousin with a brain tumor), her paternal grandmother with breast cancer. Both her father and her brother have had a B12 deficiency. Her brother was paralyzed for several months due to a B12 deficiency. Both her father and her brother take B12 injections for this. She has a family hx of DM. She denies family hx of lupus or an  autoimmune disorder.   MEDICAL HISTORY:  Past Medical History:  Diagnosis Date  . H/O degenerative disc disease   . Obesity     SURGICAL HISTORY: Past Surgical History:  Procedure Laterality Date  . fibroid tumor    . KNEE ARTHROSCOPY    . TUBAL LIGATION      SOCIAL HISTORY: Social History   Socioeconomic History  . Marital status: Single    Spouse name: Not on file  . Number of children: 3  . Years of education: 76  . Highest education level: Not on file  Occupational History  . Occupation: Training and development officer  Social  Needs  . Financial resource strain: Not on file  . Food insecurity:    Worry: Not on file    Inability: Not on file  . Transportation needs:    Medical: Not on file    Non-medical: Not on file  Tobacco Use  . Smoking status: Never Smoker  . Smokeless tobacco: Never Used  Substance and Sexual Activity  . Alcohol use: No  . Drug use: No  . Sexual activity: Not Currently    Birth control/protection: None  Lifestyle  . Physical activity:    Days per week: Not on file    Minutes per session: Not on file  . Stress: Not on file  Relationships  . Social connections:    Talks on phone: Not on file    Gets together: Not on file    Attends religious service: Not on file    Active member of club or organization: Not on file    Attends meetings of clubs or organizations: Not on file    Relationship status: Not on file  . Intimate partner violence:    Fear of current or ex partner: Not on file    Emotionally abused: Not on file    Physically abused: Not on file    Forced sexual activity: Not on file  Other Topics Concern  . Not on file  Social History Narrative   Seperated, currently in [facility].    FAMILY HISTORY: Family History  Problem Relation Age of Onset  . Diabetes Mother   . Diabetes Father     ALLERGIES:  has No Known Allergies.  MEDICATIONS:  Current Outpatient Medications  Medication Sig Dispense Refill  . acetaminophen (TYLENOL) 500 MG tablet Take 500 mg by mouth every 4 (four) hours as needed.    . cholecalciferol (VITAMIN D) 1000 units tablet Take 2,000 Units by mouth daily.    . famotidine (PEPCID) 20 MG tablet Take 20 mg by mouth 2 (two) times daily.    Marland Kitchen gabapentin (NEURONTIN) 300 MG capsule Take 1 capsule (300 mg total) by mouth 3 (three) times daily. (Patient not taking: Reported on 09/03/2017) 21 capsule 0  . meloxicam (MOBIC) 15 MG tablet Take 1 tablet (15 mg total) by mouth daily.    . methocarbamol (ROBAXIN) 500 MG tablet Take 1  tablet (500 mg total) by mouth every 6 (six) hours as needed for muscle spasms.    . polyethylene glycol (MIRALAX / GLYCOLAX) packet Take 17 g by mouth daily. Adjust to twice a day once antibiotics discontinued. 14 each 0  . rivaroxaban (XARELTO) 20 MG TABS tablet Take 1 tablet (20 mg total) by mouth daily with supper. 30 tablet   . senna-docusate (SENOKOT-S) 8.6-50 MG tablet Take 2 tablets by mouth at bedtime. Increase to twice a day once antibiotics discontinued.    . sodium phosphate (FLEET) 7-19 GM/118ML  ENEM Place 133 mLs (1 enema total) rectally daily at 6 (six) AM. (Patient not taking: Reported on 05/04/2017)  0  . traMADol (ULTRAM) 50 MG tablet Take 1 tablet (50 mg total) by mouth every 4 (four) hours as needed for severe pain. 15 tablet 0   No current facility-administered medications for this visit.     REVIEW OF SYSTEMS:    10 Point review of Systems was done is negative except as noted above.  PHYSICAL EXAMINATION:  Vitals:   09/15/18 1015  BP: 138/70  Pulse: 96  Resp: 18  Temp: 98.3 F (36.8 C)  SpO2: 100%   Filed Weights   09/15/18 1015  Weight: 251 lb 9.6 oz (114.1 kg)   .Body mass index is 49.14 kg/m.  GENERAL:alert, in no acute distress and comfortable SKIN: no acute rashes, no significant lesions EYES: conjunctiva are pink and non-injected, sclera anicteric OROPHARYNX: MMM, no exudates, no oropharyngeal erythema or ulceration NECK: supple, no JVD LYMPH:  no palpable lymphadenopathy in the cervical, axillary or inguinal regions LUNGS: clear to auscultation b/l with normal respiratory effort HEART: regular rate & rhythm ABDOMEN:  normoactive bowel sounds , non tender, not distended. Extremity: trace pedal edema PSYCH: alert & oriented x 3 with fluent speech NEURO: no focal motor/sensory deficits  LABORATORY DATA:  I have reviewed the data as listed  . CBC Latest Ref Rng & Units 09/15/2018 01/23/2017 01/19/2017  WBC 4.0 - 10.5 K/uL 9.9 7.6 4.9  Hemoglobin  12.0 - 15.0 g/dL 8.6(L) 9.9(L) 10.6(L)  Hematocrit 36.0 - 46.0 % 28.7(L) 31.7(L) 34.1(L)  Platelets 150 - 400 K/uL 284 222 288   . CBC    Component Value Date/Time   WBC 9.9 09/15/2018 1115   RBC 3.78 (L) 09/15/2018 1115   RBC 3.78 (L) 09/15/2018 1115   HGB 8.6 (L) 09/15/2018 1115   HCT 28.7 (L) 09/15/2018 1115   PLT 284 09/15/2018 1115   MCV 75.9 (L) 09/15/2018 1115   MCH 22.8 (L) 09/15/2018 1115   MCHC 30.0 09/15/2018 1115   RDW 19.2 (H) 09/15/2018 1115   LYMPHSABS 2.9 09/15/2018 1115   MONOABS 0.8 09/15/2018 1115   EOSABS 0.2 09/15/2018 1115   BASOSABS 0.0 09/15/2018 1115     . CMP Latest Ref Rng & Units 09/15/2018 01/23/2017 01/19/2017  Glucose 70 - 99 mg/dL 105(H) 125(H) 113(H)  BUN 6 - 20 mg/dL 37(H) 11 10  Creatinine 0.44 - 1.00 mg/dL 2.16(H) 0.95 0.58  Sodium 135 - 145 mmol/L 139 135 137  Potassium 3.5 - 5.1 mmol/L 4.1 3.9 3.7  Chloride 98 - 111 mmol/L 107 102 102  CO2 22 - 32 mmol/L 22 25 25   Calcium 8.9 - 10.3 mg/dL 9.5 8.8(L) 9.1  Total Protein 6.5 - 8.1 g/dL 8.2(H) - -  Total Bilirubin 0.3 - 1.2 mg/dL 0.4 - -  Alkaline Phos 38 - 126 U/L 66 - -  AST 15 - 41 U/L 7(L) - -  ALT 0 - 44 U/L 7 - -   . Lab Results  Component Value Date   IRON 8 (L) 09/15/2018   TIBC 239 09/15/2018   IRONPCTSAT 3 (L) 09/15/2018   (Iron and TIBC)  Lab Results  Component Value Date   FERRITIN 326 (H) 09/15/2018   . Lab Results  Component Value Date   RETICCTPCT 1.1 09/15/2018   RBC 3.78 (L) 09/15/2018   RBC 3.78 (L) 09/15/2018   . Lab Results  Component Value Date   LDH 172 09/15/2018  Sed rate 96   08/03/18 Haptoglobin:   07/27/18 Iron, TIBC, Ferritin:   07/26/18 CBC w/diff:   05/27/18 Hemoglobinopathy Evaluation:    RADIOGRAPHIC STUDIES: I have personally reviewed the radiological images as listed and agreed with the findings in the report. No results found.  ASSESSMENT & PLAN:  43 y.o. female with  1. Microcytic Anemia -Most recent labs from  07/26/18, WBC at 11.9k, HGB at 6.6, MCV at 73.5, ANC at 7.4k, Haptoglobin at 300. Ferritin at 322.  -Hgb on 05/27/2018 at 9.9, suggesting that this is not a new onset of anemia.  -Pt subsequently received a blood transfusion on 09/08/2018 -Initial labs note that there may be inflammation noted, however, we will determine this with additional labs.  -Discussed that the inflammation could be due to hx of recurrent UTI's.  -If none of the labs find a reason for this, we will proceed with a bone marrow to determine cause.   2. Thalassemia:  -Alpha thalassemia gene mutation testing to be sent today.   Plan:  -Discussed patient's most recent labs from 07/26/18, WBC at 11.9k, HGB at 6.6, MCV at 73.5, ANC at 7.4k, Haptoglobin at 300. Ferritin at 322.  -Pt subsequently received a blood transfusion on 09/08/18 -Initial labs note that there may be inflammation causing anemia of chronic disease however, we will determine this with further labs.  -If none of the labs find a reason for this, we will proceed with a bone marrow to determine cause.  . Orders Placed This Encounter  Procedures  . CBC with Differential/Platelet    Standing Status:   Future    Number of Occurrences:   1    Standing Expiration Date:   10/20/2019  . CMP ([place] only)    Standing Status:   Future    Number of Occurrences:   1    Standing Expiration Date:   09/16/2019  . Ferritin    Standing Status:   Future    Number of Occurrences:   1    Standing Expiration Date:   09/16/2019  . Iron and TIBC    Standing Status:   Future    Number of Occurrences:   1    Standing Expiration Date:   09/15/2019  . Lactate dehydrogenase    Standing Status:   Future    Number of Occurrences:   1    Standing Expiration Date:   09/16/2019  . Alpha-Thalassemia GenotypR    Standing Status:   Future    Number of Occurrences:   1    Standing Expiration Date:   09/16/2019  . Sedimentation rate    Standing Status:   Future    Number of  Occurrences:   1    Standing Expiration Date:   09/16/2019  . Reticulocytes    Standing Status:   Future    Number of Occurrences:   1    Standing Expiration Date:   09/16/2019  . Sample to Blood Bank    Standing Status:   Future    Number of Occurrences:   1    Standing Expiration Date:   09/16/2019   Labs today RTC with Dr Irene Limbo in 2 months with labs  All of the patients questions were answered with apparent satisfaction. The patient knows to call the clinic with any problems, questions or concerns.  The total time spent in the appt was 45 minutes and more than 50% was on counseling and direct patient cares.  Sullivan Lone MD MS AAHIVMS [facility] Hematology/Oncology Physician [facility]  (Office):       [phone] (Work cell):  [phone] (Fax):           [phone]  09/15/2018 10:31 AM  I, Soijett Blue am acting as scribe for Dr. Sullivan Lone.  .I have reviewed the above documentation for accuracy and completeness, and I agree with the above. Brunetta Genera MD

## 2018-09-15 ENCOUNTER — Inpatient Hospital Stay: Payer: Medicaid Other

## 2018-09-15 ENCOUNTER — Telehealth: Payer: Self-pay

## 2018-09-15 ENCOUNTER — Inpatient Hospital Stay: Payer: Medicaid Other | Attending: Hematology | Admitting: Hematology

## 2018-09-15 VITALS — BP 138/70 | HR 96 | Temp 98.3°F | Resp 18 | Ht 60.0 in | Wt 251.6 lb

## 2018-09-15 DIAGNOSIS — D569 Thalassemia, unspecified: Secondary | ICD-10-CM | POA: Insufficient documentation

## 2018-09-15 DIAGNOSIS — E669 Obesity, unspecified: Secondary | ICD-10-CM | POA: Diagnosis not present

## 2018-09-15 DIAGNOSIS — D649 Anemia, unspecified: Secondary | ICD-10-CM

## 2018-09-15 DIAGNOSIS — Z86718 Personal history of other venous thrombosis and embolism: Secondary | ICD-10-CM | POA: Diagnosis not present

## 2018-09-15 DIAGNOSIS — D509 Iron deficiency anemia, unspecified: Secondary | ICD-10-CM

## 2018-09-15 LAB — CBC WITH DIFFERENTIAL/PLATELET
Abs Immature Granulocytes: 0.06 10*3/uL (ref 0.00–0.07)
BASOS ABS: 0 10*3/uL (ref 0.0–0.1)
BASOS PCT: 0 %
Eosinophils Absolute: 0.2 10*3/uL (ref 0.0–0.5)
Eosinophils Relative: 2 %
HCT: 28.7 % — ABNORMAL LOW (ref 36.0–46.0)
Hemoglobin: 8.6 g/dL — ABNORMAL LOW (ref 12.0–15.0)
Immature Granulocytes: 1 %
Lymphocytes Relative: 29 %
Lymphs Abs: 2.9 10*3/uL (ref 0.7–4.0)
MCH: 22.8 pg — ABNORMAL LOW (ref 26.0–34.0)
MCHC: 30 g/dL (ref 30.0–36.0)
MCV: 75.9 fL — ABNORMAL LOW (ref 80.0–100.0)
Monocytes Absolute: 0.8 10*3/uL (ref 0.1–1.0)
Monocytes Relative: 8 %
NRBC: 0 % (ref 0.0–0.2)
Neutro Abs: 5.9 10*3/uL (ref 1.7–7.7)
Neutrophils Relative %: 60 %
Platelets: 284 10*3/uL (ref 150–400)
RBC: 3.78 MIL/uL — AB (ref 3.87–5.11)
RDW: 19.2 % — ABNORMAL HIGH (ref 11.5–15.5)
WBC: 9.9 10*3/uL (ref 4.0–10.5)

## 2018-09-15 LAB — CMP (CANCER CENTER ONLY)
ALT: 7 U/L (ref 0–44)
AST: 7 U/L — ABNORMAL LOW (ref 15–41)
Albumin: 3.3 g/dL — ABNORMAL LOW (ref 3.5–5.0)
Alkaline Phosphatase: 66 U/L (ref 38–126)
Anion gap: 10 (ref 5–15)
BUN: 37 mg/dL — ABNORMAL HIGH (ref 6–20)
CO2: 22 mmol/L (ref 22–32)
Calcium: 9.5 mg/dL (ref 8.9–10.3)
Chloride: 107 mmol/L (ref 98–111)
Creatinine: 2.16 mg/dL — ABNORMAL HIGH (ref 0.44–1.00)
GFR, EST AFRICAN AMERICAN: 31 mL/min — AB (ref >60)
GFR, Estimated: 27 mL/min — ABNORMAL LOW (ref >60)
Glucose, Bld: 105 mg/dL — ABNORMAL HIGH (ref 70–99)
Potassium: 4.1 mmol/L (ref 3.5–5.1)
Sodium: 139 mmol/L (ref 135–145)
Total Bilirubin: 0.4 mg/dL (ref 0.3–1.2)
Total Protein: 8.2 g/dL — ABNORMAL HIGH (ref 6.5–8.1)

## 2018-09-15 LAB — IRON AND TIBC
Iron: 8 ug/dL — ABNORMAL LOW (ref 41–142)
Saturation Ratios: 3 % — ABNORMAL LOW (ref 21–57)
TIBC: 239 ug/dL (ref 236–444)
UIBC: 231 ug/dL (ref 120–384)

## 2018-09-15 LAB — FERRITIN: Ferritin: 326 ng/mL — ABNORMAL HIGH (ref 11–307)

## 2018-09-15 LAB — SAMPLE TO BLOOD BANK

## 2018-09-15 LAB — RETICULOCYTES
Immature Retic Fract: 20.8 % — ABNORMAL HIGH (ref 2.3–15.9)
RBC.: 3.78 MIL/uL — ABNORMAL LOW (ref 3.87–5.11)
RETIC CT PCT: 1.1 % (ref 0.4–3.1)
Retic Count, Absolute: 42.3 10*3/uL (ref 19.0–186.0)

## 2018-09-15 LAB — LACTATE DEHYDROGENASE: LDH: 172 U/L (ref 98–192)

## 2018-09-15 LAB — SEDIMENTATION RATE: Sed Rate: 96 mm/hr — ABNORMAL HIGH (ref 0–22)

## 2018-09-15 NOTE — Telephone Encounter (Signed)
Printed avs and calender of upcoming appointment. Per 1/30 los 

## 2018-09-15 NOTE — Patient Instructions (Signed)
Thank you for choosing [facility] to provide your oncology and hematology care.  To afford each patient quality time with our providers, please arrive 30 minutes before your scheduled appointment time.  If you arrive late for your appointment, you may be asked to reschedule.  We strive to give you quality time with our providers, and arriving late affects you and other patients whose appointments are after yours.    If you are a no show for multiple scheduled visits, you may be dismissed from the clinic at the providers discretion.     Again, thank you for choosing [facility], our hope is that these requests will decrease the amount of time that you wait before being seen by our physicians.  ______________________________________________________________________   Should you have questions after your visit to the [facility], please contact our office at [phone] between the hours of 8:30 and 4:30 p.m.    Voicemails left after 4:30p.m will not be returned until the following business day.     For prescription refill requests, please have your pharmacy contact us directly.  Please also try to allow 48 hours for prescription requests.     Please contact the scheduling department for questions regarding scheduling.  For scheduling of procedures such as PET scans, CT scans, MRI, Ultrasound, etc please contact central scheduling at [phone].     Resources For Cancer Patients and Caregivers:    Oncolink.org:  A wonderful resource for patients and healthcare providers for information regarding your disease, ways to tract your treatment, what to expect, etc.      American Cancer Society:  800-227-2345  Can help patients locate various types of support and financial assistance   Cancer Care: 1-800-813-HOPE (4673) Provides financial assistance, online support groups, medication/co-pay assistance.     [place] DSS:  336-641-3447 Where to apply  for food stamps, Medicaid, and utility assistance   Medicare Rights Center: 800-333-4114 Helps people with Medicare understand their rights and benefits, navigate the Medicare system, and secure the quality healthcare they deserve   SCAT: 336-333-6589 [place] [employer]'s shared-ride transportation service for eligible riders who have a disability that prevents them from riding the fixed route bus.     For additional information on assistance programs please contact our social worker:   Abigail Elmore:  [phone]  

## 2018-09-15 NOTE — Telephone Encounter (Signed)
Error opening  

## 2018-09-23 LAB — ALPHA-THALASSEMIA GENOTYPR

## 2018-11-10 ENCOUNTER — Ambulatory Visit: Payer: Medicaid Other | Admitting: Hematology

## 2018-11-10 ENCOUNTER — Other Ambulatory Visit: Payer: Medicaid Other

## 2018-12-05 ENCOUNTER — Ambulatory Visit: Payer: Medicaid Other | Admitting: Neurology

## 2018-12-13 ENCOUNTER — Other Ambulatory Visit: Payer: Self-pay | Admitting: *Deleted

## 2018-12-13 DIAGNOSIS — D509 Iron deficiency anemia, unspecified: Secondary | ICD-10-CM

## 2018-12-14 ENCOUNTER — Inpatient Hospital Stay: Payer: Medicaid Other | Admitting: Hematology

## 2018-12-14 ENCOUNTER — Inpatient Hospital Stay: Payer: Medicaid Other | Attending: Hematology

## 2019-02-08 ENCOUNTER — Ambulatory Visit: Payer: Medicaid Other | Admitting: Neurology

## 2019-03-20 ENCOUNTER — Emergency Department (HOSPITAL_COMMUNITY)
Admission: EM | Admit: 2019-03-20 | Discharge: 2019-03-21 | Disposition: A | Discharge status: Home / Self Care | Payer: Medicaid Other | Attending: Emergency Medicine | Admitting: Emergency Medicine

## 2019-03-20 ENCOUNTER — Emergency Department (HOSPITAL_COMMUNITY): Payer: Medicaid Other

## 2019-03-20 DIAGNOSIS — N390 Urinary tract infection, site not specified: Secondary | ICD-10-CM | POA: Diagnosis not present

## 2019-03-20 DIAGNOSIS — Z79899 Other long term (current) drug therapy: Secondary | ICD-10-CM | POA: Diagnosis not present

## 2019-03-20 DIAGNOSIS — G373 Acute transverse myelitis in demyelinating disease of central nervous system: Secondary | ICD-10-CM | POA: Insufficient documentation

## 2019-03-20 DIAGNOSIS — G8220 Paraplegia, unspecified: Secondary | ICD-10-CM | POA: Insufficient documentation

## 2019-03-20 DIAGNOSIS — N1330 Unspecified hydronephrosis: Secondary | ICD-10-CM

## 2019-03-20 DIAGNOSIS — N179 Acute kidney failure, unspecified: Secondary | ICD-10-CM | POA: Insufficient documentation

## 2019-03-20 DIAGNOSIS — M549 Dorsalgia, unspecified: Secondary | ICD-10-CM | POA: Diagnosis present

## 2019-03-20 DIAGNOSIS — E119 Type 2 diabetes mellitus without complications: Secondary | ICD-10-CM | POA: Diagnosis not present

## 2019-03-20 LAB — COMPREHENSIVE METABOLIC PANEL
ALT: 11 U/L (ref 0–44)
AST: 8 U/L — ABNORMAL LOW (ref 15–41)
Albumin: 3 g/dL — ABNORMAL LOW (ref 3.5–5.0)
Alkaline Phosphatase: 58 U/L (ref 38–126)
Anion gap: 10 (ref 5–15)
BUN: 59 mg/dL — ABNORMAL HIGH (ref 6–20)
CO2: 19 mmol/L — ABNORMAL LOW (ref 22–32)
Calcium: 8.8 mg/dL — ABNORMAL LOW (ref 8.9–10.3)
Chloride: 111 mmol/L (ref 98–111)
Creatinine, Ser: 3.48 mg/dL — ABNORMAL HIGH (ref 0.44–1.00)
GFR calc Af Amer: 18 mL/min — ABNORMAL LOW (ref >60)
GFR calc non Af Amer: 15 mL/min — ABNORMAL LOW (ref >60)
Glucose, Bld: 100 mg/dL — ABNORMAL HIGH (ref 70–99)
Potassium: 4.3 mmol/L (ref 3.5–5.1)
Sodium: 140 mmol/L (ref 135–145)
Total Bilirubin: 0.2 mg/dL — ABNORMAL LOW (ref 0.3–1.2)
Total Protein: 8.2 g/dL — ABNORMAL HIGH (ref 6.5–8.1)

## 2019-03-20 LAB — URINALYSIS, ROUTINE W REFLEX MICROSCOPIC
Bilirubin Urine: NEGATIVE
Glucose, UA: NEGATIVE mg/dL
Ketones, ur: NEGATIVE mg/dL
Nitrite: NEGATIVE
Protein, ur: 100 mg/dL — AB
RBC / HPF: >50 RBC/hpf — ABNORMAL HIGH (ref 0–5)
Specific Gravity, Urine: 1.01 (ref 1.005–1.030)
WBC, UA: >50 WBC/hpf — ABNORMAL HIGH (ref 0–5)
pH: 6 (ref 5.0–8.0)

## 2019-03-20 LAB — CBC WITH DIFFERENTIAL/PLATELET
Abs Immature Granulocytes: 0.03 10*3/uL (ref 0.00–0.07)
Basophils Absolute: 0 10*3/uL (ref 0.0–0.1)
Basophils Relative: 0 %
Eosinophils Absolute: 0.5 10*3/uL (ref 0.0–0.5)
Eosinophils Relative: 5 %
HCT: 27.8 % — ABNORMAL LOW (ref 36.0–46.0)
Hemoglobin: 8.3 g/dL — ABNORMAL LOW (ref 12.0–15.0)
Immature Granulocytes: 0 %
Lymphocytes Relative: 37 %
Lymphs Abs: 3.4 10*3/uL (ref 0.7–4.0)
MCH: 22 pg — ABNORMAL LOW (ref 26.0–34.0)
MCHC: 29.9 g/dL — ABNORMAL LOW (ref 30.0–36.0)
MCV: 73.7 fL — ABNORMAL LOW (ref 80.0–100.0)
Monocytes Absolute: 0.5 10*3/uL (ref 0.1–1.0)
Monocytes Relative: 5 %
Neutro Abs: 4.8 10*3/uL (ref 1.7–7.7)
Neutrophils Relative %: 53 %
Platelets: 336 10*3/uL (ref 150–400)
RBC: 3.77 MIL/uL — ABNORMAL LOW (ref 3.87–5.11)
RDW: 19.6 % — ABNORMAL HIGH (ref 11.5–15.5)
WBC: 9.2 10*3/uL (ref 4.0–10.5)
nRBC: 0 % (ref 0.0–0.2)

## 2019-03-20 LAB — I-STAT BETA HCG BLOOD, ED (MC, WL, AP ONLY): I-stat hCG, quantitative: <5 m[IU]/mL (ref <5)

## 2019-03-20 MED ORDER — CEPHALEXIN 500 MG PO CAPS: 500.0000 mg | ORAL_CAPSULE | Freq: Four times a day (QID) | ORAL | 0 refills | Status: DC

## 2019-03-20 MED ORDER — SODIUM CHLORIDE 0.9 % IV SOLN
1.0000 g | Freq: Once | INTRAVENOUS | Status: AC
  Administered 2019-03-20: 1 g via INTRAVENOUS
  Filled 2019-03-20: qty 10

## 2019-03-20 NOTE — ED Provider Notes (Signed)
[facility] EMERGENCY DEPARTMENT Provider Note   CSN: [id number] Arrival date & time: 03/20/19  1739    History   Chief Complaint Chief Complaint  Patient presents with   Back Pain    HPI Traci [name] is a 43 y.o. female.     The history is provided by the patient and medical records. No language interpreter was used.  Back Pain  Traci [name] is a 43 y.o. female  with a PMH as listed below who presents to the Emergency Department today.  Patient states that she went to the urologist this morning and was told to come to the ER.  She is unsure of the urology clinic or the provider whom she saw.  She is unsure exactly why they sent her here as well.  She said they did blood work and an ultrasound there.  They told her something about her kidney numbers getting worse and told her that she needed to be seen in the ER.  Unfortunately, have no notes listed her paperwork to help further clarify her reason for being here today.  She has history of paraplegia.  She denies any abdominal pain or back pain today.  She used to self cath, but she urinates independently now.  No changes in her baseline.  She has no complaints currently.   Past Medical History:  Diagnosis Date   H/O degenerative disc disease    Obesity     Patient Active Problem List   Diagnosis Date Noted   Proteus infection    Fever    Generalized abdominal pain    Non-intractable vomiting with nausea    Hemorrhoids    Neuropathic pain    Acute blood loss anemia    Transaminitis    Acute lower UTI    Hyponatremia    Peroneal DVT (deep venous thrombosis), left    Thoracic myelopathy    Neurogenic bladder    Neurogenic bowel    Bilateral leg weakness    CNS demyelination (HCC)    Myelitis (HCC)    Paresthesia of both lower extremities    Morbid obesity ([place])    Spinal stenosis of lumbar region    DDD (degenerative disc disease), lumbosacral    Diabetes mellitus type  2 in obese (HCC)    Paraplegia ([place])    Transverse myelitis ([place]) 12/25/2016   Steroid-induced hyperglycemia 12/25/2016    Past Surgical History:  Procedure Laterality Date   fibroid tumor     KNEE ARTHROSCOPY     TUBAL LIGATION       OB History   No obstetric history on file.      Home Medications    Prior to Admission medications   Medication Sig Start Date End Date Taking? Authorizing Provider  acetaminophen (TYLENOL) 500 MG tablet Take 500 mg by mouth every 4 (four) hours as needed.    [provider]  cephALEXin (KEFLEX) 500 MG capsule Take 1 capsule (500 mg total) by mouth 4 (four) times daily. 03/20/19   Kevontae Burgoon, Ozella Almond, PA-C  cholecalciferol (VITAMIN D) 1000 units tablet Take 2,000 Units by mouth daily.    [provider]  famotidine (PEPCID) 20 MG tablet Take 20 mg by mouth 2 (two) times daily.    [provider]  gabapentin (NEURONTIN) 300 MG capsule Take 1 capsule (300 mg total) by mouth 3 (three) times daily. 05/04/17   Pieter Partridge, DO  meloxicam (MOBIC) 15 MG tablet Take 1 tablet (15 mg total)  by mouth daily. 01/27/17   Love, Ivan Anchors, PA-C  methocarbamol (ROBAXIN) 500 MG tablet Take 1 tablet (500 mg total) by mouth every 6 (six) hours as needed for muscle spasms. 01/26/17   Love, Ivan Anchors, PA-C  polyethylene glycol (MIRALAX / GLYCOLAX) packet Take 17 g by mouth daily. Adjust to twice a day once antibiotics discontinued. 01/27/17   Love, Ivan Anchors, PA-C  rivaroxaban (XARELTO) 20 MG TABS tablet Take 1 tablet (20 mg total) by mouth daily with supper. 01/26/17   Love, Ivan Anchors, PA-C  senna-docusate (SENOKOT-S) 8.6-50 MG tablet Take 2 tablets by mouth at bedtime. Increase to twice a day once antibiotics discontinued. 01/26/17   Love, Ivan Anchors, PA-C  sodium phosphate (FLEET) 7-19 GM/118ML ENEM Place 133 mLs (1 enema total) rectally daily at 6 (six) AM. Patient not taking: Reported on 05/04/2017 01/27/17   Love, Ivan Anchors, PA-C  traMADol (ULTRAM)  50 MG tablet Take 1 tablet (50 mg total) by mouth every 4 (four) hours as needed for severe pain. 01/26/17   Love, Ivan Anchors, PA-C  pregabalin (LYRICA) 100 MG capsule Take 1 capsule (100 mg total) by mouth 3 (three) times daily. 09/03/17 03/03/18  Pieter Partridge, DO    Family History Family History  Problem Relation Age of Onset   Diabetes Mother    Diabetes Father     Social History Social History   Tobacco Use   Smoking status: Never Smoker   Smokeless tobacco: Never Used  Substance Use Topics   Alcohol use: No   Drug use: No     Allergies   Patient has no known allergies.   Review of Systems Review of Systems  All other systems reviewed and are negative.    Physical Exam Updated Vital Signs BP 135/71 (BP Location: Right Wrist)    Pulse 80    Temp 98.5 F (36.9 C) (Oral)    Resp 16    SpO2 100%   Physical Exam Vitals signs and nursing note reviewed.  Constitutional:      General: She is not in acute distress.    Appearance: She is well-developed.  HENT:     Head: Normocephalic and atraumatic.  Neck:     Musculoskeletal: Neck supple.  Cardiovascular:     Rate and Rhythm: Normal rate and regular rhythm.     Heart sounds: Normal heart sounds. No murmur.  Pulmonary:     Effort: Pulmonary effort is normal. No respiratory distress.     Breath sounds: Normal breath sounds.  Abdominal:     General: There is no distension.     Palpations: Abdomen is soft.     Tenderness: There is no abdominal tenderness.  Skin:    General: Skin is warm and dry.  Neurological:     Mental Status: She is alert and oriented to person, place, and time.      ED Treatments / Results  Labs (all labs ordered are listed, but only abnormal results are displayed) Labs Reviewed  CBC WITH DIFFERENTIAL/PLATELET - Abnormal; Notable for the following components:      Result Value   RBC 3.77 (*)    Hemoglobin 8.3 (*)    HCT 27.8 (*)    MCV 73.7 (*)    MCH 22.0 (*)    MCHC 29.9 (*)     RDW 19.6 (*)    All other components within normal limits  COMPREHENSIVE METABOLIC PANEL - Abnormal; Notable for the following components:   CO2 19 (*)  Glucose, Bld 100 (*)    BUN 59 (*)    Creatinine, Ser 3.48 (*)    Calcium 8.8 (*)    Total Protein 8.2 (*)    Albumin 3.0 (*)    AST 8 (*)    Total Bilirubin 0.2 (*)    GFR calc non Af Amer 15 (*)    GFR calc Af Amer 18 (*)    All other components within normal limits  URINALYSIS, ROUTINE W REFLEX MICROSCOPIC - Abnormal; Notable for the following components:   Color, Urine AMBER (*)    APPearance CLOUDY (*)    Hgb urine dipstick LARGE (*)    Protein, ur 100 (*)    Leukocytes,Ua LARGE (*)    RBC / HPF >50 (*)    WBC, UA >50 (*)    Bacteria, UA MANY (*)    All other components within normal limits  URINE CULTURE  URINE CULTURE  I-STAT BETA HCG BLOOD, ED ([facility], [facility], [facility] ONLY)    EKG None  Radiology Ct Renal Stone Study  Result Date: 03/20/2019 CLINICAL DATA:  Abnormal renal ultrasound EXAM: CT ABDOMEN AND PELVIS WITHOUT CONTRAST TECHNIQUE: Multidetector CT imaging of the abdomen and pelvis was performed following the standard protocol without IV contrast. COMPARISON:  03/20/2019 ultrasound, CT 09/19/2015 FINDINGS: Lower chest: Lung bases demonstrate no acute consolidation or effusion. Heart size is normal Hepatobiliary: No focal liver abnormality is seen. No gallstones, gallbladder wall thickening, or biliary dilatation. Pancreas: Unremarkable. No pancreatic ductal dilatation or surrounding inflammatory changes. Spleen: Normal in size without focal abnormality. Adrenals/Urinary Tract: Adrenal glands are normal. Severe right greater than left hydronephrosis. Cortical thinning left kidney. No calcified stones. Bilateral hydroureter down to the bladder. The bladder is slightly thick walled and lobulated in contour. Stomach/Bowel: Stomach is within normal limits. Appendix appears normal. No evidence of bowel wall thickening, distention,  or inflammatory changes. Moderate feces in the rectum Vascular/Lymphatic: No significant vascular findings are present. No enlarged abdominal or pelvic lymph nodes. Reproductive: Uterus and bilateral adnexa are unremarkable. Other: Negative for free air or free fluid. Musculoskeletal: Diffuse fatty atrophy of the pelvic, lower paraspinal, and upper thigh muscles. No acute osseous abnormality. IMPRESSION: 1. Moderate severe bilateral right greater than left hydronephrosis and hydroureter without ureteral stone. The bladder appears slightly thickened and lobulated in contour, possible neurogenic bladder. Renal parenchymal thinning suspected on the left. Appearance of the kidneys and bladder is significantly changed as compared with 2017 CT. Electronically Signed   By: Donavan Foil M.D.   On: 03/20/2019 20:41    Procedures Procedures (including critical care time)  Medications Ordered in ED Medications  cefTRIAXone (ROCEPHIN) 1 g in sodium chloride 0.9 % 100 mL IVPB (has no administration in time range)     Initial Impression / Assessment and Plan / ED Course  I have reviewed the triage vital signs and the nursing notes.  Pertinent labs & imaging results that were available during my care of the patient were reviewed by me and considered in my medical decision making (see chart for details).       Parveen Freehling is a 43 y.o. female who presents to ED from urology clinic by EMS. Patient unsure why she is here. She states they did an ultrasound and told her to come to ED to get checked out.   6:50 PM - Discussed with urology extender who did confirm that patient was seen in clinic today. Per their chart, patient was seen by NP at urology  clinic today. Renal ultrasound was done showing bilateral hydronephrosis. Creatinine was elevated at 3.2. Sent to ED to have renal stone study to evaluate if this was an obstructive pathology from stone. Per extender, NP note stating CT renal study and if negative  for stone, recommends placing indwelling foley.   UA concerning for infection with large leukocytes, greater than 50 WBCs and many bacteria. She does have creatinine of 3.48 today which is up from previous. White count normal. Baseline anemia. CT shows bilateral hydronephrosis and hydroureter without stone.  Also notes renal parenchymal thinning suspected on the left.  I discussed these findings with on-call urology, Dr. Lovena Neighbours, who recommends placing an indwelling Foley catheter and obtaining urine culture from cath specimen which we have done today.  Recommends follow-up in 1 week for repeat lab work and recheck.  Given dose of Rocephin in ED and will start on Keflex.  Discussed home care instructions, follow-up with urology plan and return precautions.  All questions were answered.   Final Clinical Impressions(s) / ED Diagnoses   Final diagnoses:  Lower urinary tract infectious disease  AKI (acute kidney injury) ([place])  Hydronephrosis, unspecified hydronephrosis type    ED Discharge Orders         Ordered    cephALEXin (KEFLEX) 500 MG capsule  4 times daily     03/20/19 2248           Tatyana Biber, Ozella Almond, PA-C 03/20/19 2257    Varney Biles, MD 03/22/19 1715

## 2019-03-20 NOTE — Discharge Instructions (Signed)
It was my pleasure taking care of you today!   Please take all of your antibiotics until finished!   Call the urology clinic tomorrow morning to schedule a follow up appointment.   Return to ER for fever, vomiting, new or worsening symptoms, any additional concerns.

## 2019-03-21 NOTE — ED Notes (Signed)
Patient verbalizes understanding of discharge instructions. Opportunity for questioning and answers were provided. Armband removed by staff, pt discharged from ED by [facility] to return to facility. [facility] RN spoken to and gave report. Security to transport pt's wheelchair back to facility

## 2019-03-21 NOTE — ED Notes (Signed)
Called [facility] for transport back to [facility]

## 2019-03-22 LAB — URINE CULTURE: Culture: >=100000 — AB

## 2019-03-24 ENCOUNTER — Telehealth: Payer: Self-pay | Admitting: *Deleted

## 2019-03-24 NOTE — Telephone Encounter (Signed)
Post ED Visit - Positive Culture Follow-up  Culture report reviewed by antimicrobial stewardship pharmacist: [place] Team []  Elenor Quinones, Pharm.D. []  Heide Guile, Pharm.D., BCPS AQ-ID []  Parks Neptune, Pharm.D., BCPS []  Alycia Rossetti, Pharm.D., BCPS []  [place], Pharm.D., BCPS, AAHIVP []  Legrand Como, Pharm.D., BCPS, AAHIVP []  Salome Arnt, PharmD, BCPS []  Johnnette Gourd, PharmD, BCPS []  Hughes Better, PharmD, BCPS []  Leeroy Cha, PharmD []  Laqueta Linden, PharmD, BCPS []  Albertina Parr, PharmD  [place] Team []  Leodis Sias, PharmD []  Lindell Spar, PharmD []  Royetta Asal, PharmD []  Graylin Shiver, Rph []  Rema Fendt) Glennon Mac, PharmD []  Arlyn Dunning, PharmD []  Netta Cedars, PharmD []  Dia Sitter, PharmD []  Leone Haven, PharmD []  Gretta Arab, PharmD []  Theodis Shove, PharmD []  Peggyann Juba, PharmD []  Reuel Boom, PharmD Eddie Candle, PharmD  Positive urine culture Treated with Cephalexin, organism sensitive to the same and no further patient follow-up is required at this time.  Harlon Flor [facility] 03/24/2019, 10:42 AM

## 2019-03-28 NOTE — Progress Notes (Signed)
HEMATOLOGY/ONCOLOGY CONSULTATION NOTE  Date of Service: 03/28/2019  Patient Care Team: Seward Carol, MD as PCP - General (Internal Medicine)  CHIEF COMPLAINTS/PURPOSE OF CONSULTATION:  Microcytic Anemia  HISTORY OF PRESENTING ILLNESS:   Traci [name] is a wonderful 43 y.o. female who has been referred to [place] by Dr. Merri Ray for evaluation and management of anemia. Her PCP is Dr. Delfina Redwood and her Neurologist is Dr. Tomi Likens. The pt reports that she is doing well overall.  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 She denies having a hx of anemia prior to being diagnosed in October 2019. She was not feeling fatigued when her Hgb was low in October. She denies heavy cycles, however, notes that she has irregular menstrual cycles. When she does have menstrual cycles they last 2-3 days with only the first day being heavy. She is no longer on meloxicam at this time.  She notes that she was sick recently and lost 40 lbs due to recurrent UTIs. She is not able to feel that her bladder is full, however, she wasn't diagnosed with neurogenic bladder. She is taking cranberry and probiotics pill BID, catheter twice a day, and following up with an urologist. She has had recurrent UTIs at least 1 every month. She is also on an iron pill and she has been on it for about 2.5 months at this time, this was started after her initial diagnosis. She is taking vitamin D as well. Additionally, she is taking Lyrica TID and senakot BID. She denies kidney issues.   The pt denies any new symptoms or changes in function over the past 6 months. She had skin breakdown to her left lateral lower extremity, due to wearing a boot, however, this has resolved. She denies issues with bed sores, leg swelling, recent infection, melena, blood in stools, bowel issues, joint pain/swelling, skin rashes, mouth sores, bone pain, and any other symptoms.   Most recent lab  results (07/26/18) of CBC w/diff is as follows: all values are WNL except for WBC at 11.9k, RBC at 2.81, HGB at 6.6, HCT at 20.6, MCV at 73.5, MCH at 23.4, RDW at 16.9, ANC at 7.4k. 08/03/18 Haptoglobin at 300 07/27/18 Ferritin at 322.5 07/27/18 Iron & TIBC revealed all values WNL except for Iron at 22, TIBC at 189, Transferrin saturation at 11.58 05/27/18 Hemoglobinopathy evaluation revealed HgbA at 98.1, HgbA2 at 1.9 05/27/2018, Hgb at 11.   On review of systems, pt reports she felt better after receiving the blood transfusion, however, she notes that it started her menstrual cycle, and denies abdominal pain, enlarged lymph nodes, back pain, and any other symptoms.   On PMHx the pt reports transverse myelitis, anemia x 3 months, left DVT on xarelto x 6 months (off now), sleep apnea (no sleep study yet). 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  On Family Hx the pt denies blood disorders in her family. She does have a family hx of cancer, with her extended family with cancers (uncle with leukemia and her cousin with a brain tumor), her paternal grandmother with breast cancer. Both her father and her brother have had a B12 deficiency. Her brother was paralyzed for several months due to a B12 deficiency. Both her father and her brother take B12 injections for this. She has a family hx of DM. She denies family hx of lupus or an  autoimmune disorder.   Interval History  Traci [name] is a 43 y.o. female presenting today for the management and evaluation of her microcytic anemia. The patient's last visit with [place] was on 09/15/2018. The pt reports that she is doing well overall.  The pt reports that her bladder symptoms appeared at the same time as her low blood counts. She is going back to her urologist tomorrow 03/30/2019. According to the pt, this is her first urinary infection this year. She is taking PO iron BID. She reports fatigue and denies  dizziness and lightheadedness. The pt notes a familial hx of Vit B12 deficiency.  Of note since the patient's last visit, pt has had CT renal stone study completed on 03/20/2019 with results revealing "1. Moderate severe bilateral right greater than left hydronephrosis and hydroureter without ureteral stone. The bladder appears slightly thickened and lobulated in contour, possible neurogenic bladder. Renal parenchymal thinning suspected on the left. Appearance of the kidneys and bladder is significantly changed as compared with 2017 CT."  Lab results today (03/29/2019) of CBC w/diff and CMP is as follows: all values are WNL except for RBC at 3.86, HGB at 8.5, HCT at 28.8, MCV at 74.6, MCH at 22.0, MCHC at 29.5, RDW at 21.3, eosinophils abs at 0.6k, glucose bld at 127, BUN at 49, Creatinine at 2.85, total protein at 8.2, Albumin at 3.2, AST at 10, total bilrubin at <0.2. 03/29/2019 Reticulocytes revealed retic ct pct at 3.2, immature retic fract at 28.8 03/29/2019 Iron sat 12% 03/29/2019 Ferritin is 211 03/29/2019 LDH at 170  On review of systems, pt reports fatigue and denies lightheadedness, dizziness, and any other symptoms.   MEDICAL HISTORY:  Past Medical History:  Diagnosis Date   H/O degenerative disc disease    Obesity     SURGICAL HISTORY: Past Surgical History:  Procedure Laterality Date   fibroid tumor     KNEE ARTHROSCOPY     TUBAL LIGATION      SOCIAL HISTORY: Social History   Socioeconomic History   Marital status: Single    Spouse name: Not on file   Number of children: 3   Years of education: 12   Highest education level: Not on file  Occupational History   Occupation: Buyer, retail strain: Not on file   Food insecurity    Worry: Not on file    Inability: Not on file   Transportation needs    Medical: Not on file    Non-medical: Not on file  Tobacco Use   Smoking status: Never Smoker   Smokeless tobacco: Never  Used  Substance and Sexual Activity   Alcohol use: No   Drug use: No   Sexual activity: Not Currently    Birth control/protection: None  Lifestyle   Physical activity    Days per week: Not on file    Minutes per session: Not on file   Stress: Not on file  Relationships   Social connections    Talks on phone: Not on file    Gets together: Not on file    Attends religious service: Not on file    Active member of club or organization: Not on file    Attends meetings of clubs or organizations: Not on file    Relationship status: Not on file   Intimate partner violence    Fear of current or ex partner: Not on file    Emotionally abused: Not on file    Physically  abused: Not on file    Forced sexual activity: Not on file  Other Topics Concern   Not on file  Social History Narrative   Seperated, currently in [facility].    FAMILY HISTORY: Family History  Problem Relation Age of Onset   Diabetes Mother    Diabetes Father     ALLERGIES:  has No Known Allergies.  MEDICATIONS:  Current Outpatient Medications  Medication Sig Dispense Refill   acetaminophen (TYLENOL) 500 MG tablet Take 500 mg by mouth every 4 (four) hours as needed.     cephALEXin (KEFLEX) 500 MG capsule Take 1 capsule (500 mg total) by mouth 4 (four) times daily. 28 capsule 0   cholecalciferol (VITAMIN D) 1000 units tablet Take 2,000 Units by mouth daily.     famotidine (PEPCID) 20 MG tablet Take 20 mg by mouth 2 (two) times daily.     gabapentin (NEURONTIN) 300 MG capsule Take 1 capsule (300 mg total) by mouth 3 (three) times daily. 21 capsule 0   meloxicam (MOBIC) 15 MG tablet Take 1 tablet (15 mg total) by mouth daily.     methocarbamol (ROBAXIN) 500 MG tablet Take 1 tablet (500 mg total) by mouth every 6 (six) hours as needed for muscle spasms.     polyethylene glycol (MIRALAX / GLYCOLAX) packet Take 17 g by mouth daily. Adjust to twice a day once antibiotics discontinued. 14  each 0   rivaroxaban (XARELTO) 20 MG TABS tablet Take 1 tablet (20 mg total) by mouth daily with supper. 30 tablet    senna-docusate (SENOKOT-S) 8.6-50 MG tablet Take 2 tablets by mouth at bedtime. Increase to twice a day once antibiotics discontinued.     sodium phosphate (FLEET) 7-19 GM/118ML ENEM Place 133 mLs (1 enema total) rectally daily at 6 (six) AM. (Patient not taking: Reported on 05/04/2017)  0   traMADol (ULTRAM) 50 MG tablet Take 1 tablet (50 mg total) by mouth every 4 (four) hours as needed for severe pain. 15 tablet 0   No current facility-administered medications for this visit.     REVIEW OF SYSTEMS:    A 10+ POINT REVIEW OF SYSTEMS WAS OBTAINED including neurology, dermatology, psychiatry, cardiac, respiratory, lymph, extremities, GI, GU, Musculoskeletal, constitutional, breasts, reproductive, HEENT.  All pertinent positives are noted in the HPI.  All others are negative.   PHYSICAL EXAMINATION:  Vitals:   03/29/19 1410  BP: (!) 115/47  Pulse: 79  Resp: 18  Temp: 98.7 F (37.1 C)  SpO2: 100%   Filed Weights   03/29/19 1410  Weight: 260 lb (117.9 kg)   .Body mass index is 50.78 kg/m.   GENERAL:alert, in no acute distress and comfortable SKIN: no acute rashes, no significant lesions EYES: conjunctiva are pink and non-injected, sclera anicteric OROPHARYNX: MMM, no exudates, no oropharyngeal erythema or ulceration NECK: supple, no JVD LYMPH:  no palpable lymphadenopathy in the cervical, axillary or inguinal regions LUNGS: clear to auscultation b/l with normal respiratory effort HEART: regular rate & rhythm ABDOMEN:  normoactive bowel sounds, non tender, not distended. No palpable hepatosplenomegaly.  Extremity: 1+ pedal edema PSYCH: alert & oriented x 3 with fluent speech NEURO: no focal motor/sensory deficits   LABORATORY DATA:  I have reviewed the data as listed  . CBC Latest Ref Rng & Units 03/29/2019 03/20/2019 09/15/2018  WBC 4.0 - 10.5 K/uL 8.1 9.2  9.9  Hemoglobin 12.0 - 15.0 g/dL 8.5(L) 8.3(L) 8.6(L)  Hematocrit 36.0 - 46.0 % 28.8(L) 27.8(L) 28.7(L)  Platelets  150 - 400 K/uL 272 336 284   . CBC    Component Value Date/Time   WBC 8.1 03/29/2019 1355   WBC 9.2 03/20/2019 1949   RBC 3.84 (L) 03/29/2019 1355   RBC 3.86 (L) 03/29/2019 1355   HGB 8.5 (L) 03/29/2019 1355   HCT 28.8 (L) 03/29/2019 1355   PLT 272 03/29/2019 1355   MCV 74.6 (L) 03/29/2019 1355   MCH 22.0 (L) 03/29/2019 1355   MCHC 29.5 (L) 03/29/2019 1355   RDW 21.3 (H) 03/29/2019 1355   LYMPHSABS 3.2 03/29/2019 1355   MONOABS 0.6 03/29/2019 1355   EOSABS 0.6 (H) 03/29/2019 1355   BASOSABS 0.0 03/29/2019 1355     . CMP Latest Ref Rng & Units 03/29/2019 03/20/2019 09/15/2018  Glucose 70 - 99 mg/dL 127(H) 100(H) 105(H)  BUN 6 - 20 mg/dL 49(H) 59(H) 37(H)  Creatinine 0.44 - 1.00 mg/dL 2.85(H) 3.48(H) 2.16(H)  Sodium 135 - 145 mmol/L 140 140 139  Potassium 3.5 - 5.1 mmol/L 4.0 4.3 4.1  Chloride 98 - 111 mmol/L 109 111 107  CO2 22 - 32 mmol/L 22 19(L) 22  Calcium 8.9 - 10.3 mg/dL 9.5 8.8(L) 9.5  Total Protein 6.5 - 8.1 g/dL 8.2(H) 8.2(H) 8.2(H)  Total Bilirubin 0.3 - 1.2 mg/dL <0.2(L) 0.2(L) 0.4  Alkaline Phos 38 - 126 U/L 65 58 66  AST 15 - 41 U/L 10(L) 8(L) 7(L)  ALT 0 - 44 U/L 9 11 7    . Lab Results  Component Value Date   IRON 35 (L) 03/29/2019   TIBC 256 03/29/2019   IRONPCTSAT 14 (L) 03/29/2019   (Iron and TIBC)  Lab Results  Component Value Date   FERRITIN 211 03/29/2019   . Lab Results  Component Value Date   RETICCTPCT 3.2 (H) 03/29/2019   RBC 3.84 (L) 03/29/2019   RBC 3.86 (L) 03/29/2019   . Lab Results  Component Value Date   LDH 170 03/29/2019   Sed rate 96  09/15/2018 Alpha Thalassemia:   08/03/18 Haptoglobin:   07/27/18 Iron, TIBC, Ferritin:   07/26/18 CBC w/diff:   05/27/18 Hemoglobinopathy Evaluation:    RADIOGRAPHIC STUDIES: I have personally reviewed the radiological images as listed and agreed with the  findings in the report. Ct Renal Stone Study  Result Date: 03/20/2019 CLINICAL DATA:  Abnormal renal ultrasound EXAM: CT ABDOMEN AND PELVIS WITHOUT CONTRAST TECHNIQUE: Multidetector CT imaging of the abdomen and pelvis was performed following the standard protocol without IV contrast. COMPARISON:  03/20/2019 ultrasound, CT 09/19/2015 FINDINGS: Lower chest: Lung bases demonstrate no acute consolidation or effusion. Heart size is normal Hepatobiliary: No focal liver abnormality is seen. No gallstones, gallbladder wall thickening, or biliary dilatation. Pancreas: Unremarkable. No pancreatic ductal dilatation or surrounding inflammatory changes. Spleen: Normal in size without focal abnormality. Adrenals/Urinary Tract: Adrenal glands are normal. Severe right greater than left hydronephrosis. Cortical thinning left kidney. No calcified stones. Bilateral hydroureter down to the bladder. The bladder is slightly thick walled and lobulated in contour. Stomach/Bowel: Stomach is within normal limits. Appendix appears normal. No evidence of bowel wall thickening, distention, or inflammatory changes. Moderate feces in the rectum Vascular/Lymphatic: No significant vascular findings are present. No enlarged abdominal or pelvic lymph nodes. Reproductive: Uterus and bilateral adnexa are unremarkable. Other: Negative for free air or free fluid. Musculoskeletal: Diffuse fatty atrophy of the pelvic, lower paraspinal, and upper thigh muscles. No acute osseous abnormality. IMPRESSION: 1. Moderate severe bilateral right greater than left hydronephrosis and hydroureter without ureteral stone. The bladder  appears slightly thickened and lobulated in contour, possible neurogenic bladder. Renal parenchymal thinning suspected on the left. Appearance of the kidneys and bladder is significantly changed as compared with 2017 CT. Electronically Signed   By: Donavan Foil M.D.   On: 03/20/2019 20:41    ASSESSMENT & PLAN:  43 y.o. female  with  1. Microcytic Anemia Likely multifactorial but primarily due to CKD, anemia of chronic inflammation. Alpha thal trait.   2. Alpha thalassemia trait   Plan:  -Discussed pt labwork today, 03/29/2019; HGB at 8.5 -Discussed that her Iron needs to be kept higher than normal due to CKD. Maintain ferritin>200 and iron saturation>20%  -Would recommend starting PO Iron polysaccharide 150mg  po BID with PCP -f/u with PCP to address CKD ,renal stones and would recommend nephrology consultation. -Discussed that the pt tested positive for alpha thalassemia trait, which can lead to slight anemia, but usually not as severe as . -she might need ESA's if she remains anemic from Anemia of CKD -No indications for BM Bx at this time. -Will see the pt back in 3 months   RTC with Dr Irene Limbo with labs in 3 months   No orders of the defined types were placed in this encounter.  All of the patients questions were answered with apparent satisfaction. The patient knows to call the clinic with any problems, questions or concerns.  The total time spent in the appt was 20 minutes and more than 50% was on counseling and direct patient cares.   Sullivan Lone MD MS AAHIVMS [facility] Hematology/Oncology Physician [facility]  (Office):       [phone] (Work cell):  [phone] (Fax):           [phone]  03/28/2019 3:31 PM  I, De Burrs, am acting as a scribe for Dr. Irene Limbo  .I have reviewed the above documentation for accuracy and completeness, and I agree with the above. Brunetta Genera MD

## 2019-03-29 ENCOUNTER — Telehealth: Payer: Self-pay | Admitting: Hematology

## 2019-03-29 ENCOUNTER — Inpatient Hospital Stay: Payer: Medicaid Other | Attending: Hematology

## 2019-03-29 ENCOUNTER — Inpatient Hospital Stay (HOSPITAL_BASED_OUTPATIENT_CLINIC_OR_DEPARTMENT_OTHER): Payer: Medicaid Other | Admitting: Hematology

## 2019-03-29 ENCOUNTER — Other Ambulatory Visit: Payer: Self-pay

## 2019-03-29 VITALS — BP 115/47 | HR 79 | Temp 98.7°F | Resp 18 | Ht 60.0 in | Wt 260.0 lb

## 2019-03-29 DIAGNOSIS — D631 Anemia in chronic kidney disease: Secondary | ICD-10-CM | POA: Diagnosis present

## 2019-03-29 DIAGNOSIS — Z86718 Personal history of other venous thrombosis and embolism: Secondary | ICD-10-CM | POA: Diagnosis not present

## 2019-03-29 DIAGNOSIS — D638 Anemia in other chronic diseases classified elsewhere: Secondary | ICD-10-CM

## 2019-03-29 DIAGNOSIS — N189 Chronic kidney disease, unspecified: Secondary | ICD-10-CM | POA: Diagnosis not present

## 2019-03-29 DIAGNOSIS — R5383 Other fatigue: Secondary | ICD-10-CM | POA: Diagnosis not present

## 2019-03-29 DIAGNOSIS — Z87440 Personal history of urinary (tract) infections: Secondary | ICD-10-CM | POA: Insufficient documentation

## 2019-03-29 DIAGNOSIS — Z803 Family history of malignant neoplasm of breast: Secondary | ICD-10-CM | POA: Diagnosis not present

## 2019-03-29 DIAGNOSIS — Z79899 Other long term (current) drug therapy: Secondary | ICD-10-CM | POA: Insufficient documentation

## 2019-03-29 DIAGNOSIS — N1330 Unspecified hydronephrosis: Secondary | ICD-10-CM | POA: Diagnosis not present

## 2019-03-29 DIAGNOSIS — Z833 Family history of diabetes mellitus: Secondary | ICD-10-CM | POA: Insufficient documentation

## 2019-03-29 DIAGNOSIS — D509 Iron deficiency anemia, unspecified: Secondary | ICD-10-CM

## 2019-03-29 DIAGNOSIS — Z7901 Long term (current) use of anticoagulants: Secondary | ICD-10-CM | POA: Insufficient documentation

## 2019-03-29 DIAGNOSIS — N134 Hydroureter: Secondary | ICD-10-CM | POA: Diagnosis not present

## 2019-03-29 DIAGNOSIS — D563 Thalassemia minor: Secondary | ICD-10-CM | POA: Insufficient documentation

## 2019-03-29 DIAGNOSIS — E611 Iron deficiency: Secondary | ICD-10-CM | POA: Diagnosis not present

## 2019-03-29 DIAGNOSIS — Z806 Family history of leukemia: Secondary | ICD-10-CM | POA: Diagnosis not present

## 2019-03-29 DIAGNOSIS — N319 Neuromuscular dysfunction of bladder, unspecified: Secondary | ICD-10-CM | POA: Diagnosis not present

## 2019-03-29 LAB — CMP (CANCER CENTER ONLY)
ALT: 9 U/L (ref 0–44)
AST: 10 U/L — ABNORMAL LOW (ref 15–41)
Albumin: 3.2 g/dL — ABNORMAL LOW (ref 3.5–5.0)
Alkaline Phosphatase: 65 U/L (ref 38–126)
Anion gap: 9 (ref 5–15)
BUN: 49 mg/dL — ABNORMAL HIGH (ref 6–20)
CO2: 22 mmol/L (ref 22–32)
Calcium: 9.5 mg/dL (ref 8.9–10.3)
Chloride: 109 mmol/L (ref 98–111)
Creatinine: 2.85 mg/dL — ABNORMAL HIGH (ref 0.44–1.00)
GFR, Est AFR Am: 22 mL/min — ABNORMAL LOW (ref >60)
GFR, Estimated: 19 mL/min — ABNORMAL LOW (ref >60)
Glucose, Bld: 127 mg/dL — ABNORMAL HIGH (ref 70–99)
Potassium: 4 mmol/L (ref 3.5–5.1)
Sodium: 140 mmol/L (ref 135–145)
Total Bilirubin: <0.2 mg/dL — ABNORMAL LOW (ref 0.3–1.2)
Total Protein: 8.2 g/dL — ABNORMAL HIGH (ref 6.5–8.1)

## 2019-03-29 LAB — CBC WITH DIFFERENTIAL (CANCER CENTER ONLY)
Abs Immature Granulocytes: 0.05 10*3/uL (ref 0.00–0.07)
Basophils Absolute: 0 10*3/uL (ref 0.0–0.1)
Basophils Relative: 1 %
Eosinophils Absolute: 0.6 10*3/uL — ABNORMAL HIGH (ref 0.0–0.5)
Eosinophils Relative: 7 %
HCT: 28.8 % — ABNORMAL LOW (ref 36.0–46.0)
Hemoglobin: 8.5 g/dL — ABNORMAL LOW (ref 12.0–15.0)
Immature Granulocytes: 1 %
Lymphocytes Relative: 39 %
Lymphs Abs: 3.2 10*3/uL (ref 0.7–4.0)
MCH: 22 pg — ABNORMAL LOW (ref 26.0–34.0)
MCHC: 29.5 g/dL — ABNORMAL LOW (ref 30.0–36.0)
MCV: 74.6 fL — ABNORMAL LOW (ref 80.0–100.0)
Monocytes Absolute: 0.6 10*3/uL (ref 0.1–1.0)
Monocytes Relative: 7 %
Neutro Abs: 3.8 10*3/uL (ref 1.7–7.7)
Neutrophils Relative %: 45 %
Platelet Count: 272 10*3/uL (ref 150–400)
RBC: 3.86 MIL/uL — ABNORMAL LOW (ref 3.87–5.11)
RDW: 21.3 % — ABNORMAL HIGH (ref 11.5–15.5)
WBC Count: 8.1 10*3/uL (ref 4.0–10.5)
nRBC: 0 % (ref 0.0–0.2)

## 2019-03-29 LAB — RETICULOCYTES
Immature Retic Fract: 28.8 % — ABNORMAL HIGH (ref 2.3–15.9)
RBC.: 3.84 MIL/uL — ABNORMAL LOW (ref 3.87–5.11)
Retic Count, Absolute: 121.3 10*3/uL (ref 19.0–186.0)
Retic Ct Pct: 3.2 % — ABNORMAL HIGH (ref 0.4–3.1)

## 2019-03-29 LAB — SAMPLE TO BLOOD BANK

## 2019-03-29 LAB — IRON AND TIBC
Iron: 35 ug/dL — ABNORMAL LOW (ref 41–142)
Saturation Ratios: 14 % — ABNORMAL LOW (ref 21–57)
TIBC: 256 ug/dL (ref 236–444)
UIBC: 221 ug/dL (ref 120–384)

## 2019-03-29 LAB — LACTATE DEHYDROGENASE: LDH: 170 U/L (ref 98–192)

## 2019-03-29 LAB — FERRITIN: Ferritin: 211 ng/mL (ref 11–307)

## 2019-03-29 NOTE — Telephone Encounter (Signed)
Scheduled appt per 8/12 los. ° °Spoke with patient and she is aware of the appt date and time. °

## 2019-06-29 ENCOUNTER — Other Ambulatory Visit: Payer: Self-pay

## 2019-06-29 ENCOUNTER — Inpatient Hospital Stay: Payer: Medicare Other | Attending: Hematology

## 2019-06-29 ENCOUNTER — Inpatient Hospital Stay (HOSPITAL_BASED_OUTPATIENT_CLINIC_OR_DEPARTMENT_OTHER): Payer: Medicare Other | Admitting: Hematology

## 2019-06-29 VITALS — BP 140/82 | HR 82 | Temp 98.2°F | Resp 18 | Ht 61.0 in

## 2019-06-29 DIAGNOSIS — Z7901 Long term (current) use of anticoagulants: Secondary | ICD-10-CM | POA: Insufficient documentation

## 2019-06-29 DIAGNOSIS — Z86718 Personal history of other venous thrombosis and embolism: Secondary | ICD-10-CM | POA: Insufficient documentation

## 2019-06-29 DIAGNOSIS — D509 Iron deficiency anemia, unspecified: Secondary | ICD-10-CM | POA: Diagnosis not present

## 2019-06-29 DIAGNOSIS — D569 Thalassemia, unspecified: Secondary | ICD-10-CM | POA: Insufficient documentation

## 2019-06-29 DIAGNOSIS — D638 Anemia in other chronic diseases classified elsewhere: Secondary | ICD-10-CM

## 2019-06-29 DIAGNOSIS — D631 Anemia in chronic kidney disease: Secondary | ICD-10-CM | POA: Diagnosis present

## 2019-06-29 DIAGNOSIS — N189 Chronic kidney disease, unspecified: Secondary | ICD-10-CM | POA: Diagnosis present

## 2019-06-29 LAB — CBC WITH DIFFERENTIAL/PLATELET
Abs Immature Granulocytes: 0.06 10*3/uL (ref 0.00–0.07)
Basophils Absolute: 0 10*3/uL (ref 0.0–0.1)
Basophils Relative: 1 %
Eosinophils Absolute: 0.4 10*3/uL (ref 0.0–0.5)
Eosinophils Relative: 5 %
HCT: 35.7 % — ABNORMAL LOW (ref 36.0–46.0)
Hemoglobin: 10.6 g/dL — ABNORMAL LOW (ref 12.0–15.0)
Immature Granulocytes: 1 %
Lymphocytes Relative: 36 %
Lymphs Abs: 2.8 10*3/uL (ref 0.7–4.0)
MCH: 23 pg — ABNORMAL LOW (ref 26.0–34.0)
MCHC: 29.7 g/dL — ABNORMAL LOW (ref 30.0–36.0)
MCV: 77.6 fL — ABNORMAL LOW (ref 80.0–100.0)
Monocytes Absolute: 0.4 10*3/uL (ref 0.1–1.0)
Monocytes Relative: 5 %
Neutro Abs: 4.1 10*3/uL (ref 1.7–7.7)
Neutrophils Relative %: 52 %
Platelets: 289 10*3/uL (ref 150–400)
RBC: 4.6 MIL/uL (ref 3.87–5.11)
RDW: 18.8 % — ABNORMAL HIGH (ref 11.5–15.5)
WBC: 7.9 10*3/uL (ref 4.0–10.5)
nRBC: 0 % (ref 0.0–0.2)

## 2019-06-29 LAB — CMP (CANCER CENTER ONLY)
ALT: 7 U/L (ref 0–44)
AST: 7 U/L — ABNORMAL LOW (ref 15–41)
Albumin: 3.4 g/dL — ABNORMAL LOW (ref 3.5–5.0)
Alkaline Phosphatase: 75 U/L (ref 38–126)
Anion gap: 11 (ref 5–15)
BUN: 36 mg/dL — ABNORMAL HIGH (ref 6–20)
CO2: 21 mmol/L — ABNORMAL LOW (ref 22–32)
Calcium: 9 mg/dL (ref 8.9–10.3)
Chloride: 108 mmol/L (ref 98–111)
Creatinine: 2.18 mg/dL — ABNORMAL HIGH (ref 0.44–1.00)
GFR, Est AFR Am: 31 mL/min — ABNORMAL LOW (ref >60)
GFR, Estimated: 27 mL/min — ABNORMAL LOW (ref >60)
Glucose, Bld: 197 mg/dL — ABNORMAL HIGH (ref 70–99)
Potassium: 4 mmol/L (ref 3.5–5.1)
Sodium: 140 mmol/L (ref 135–145)
Total Bilirubin: 0.3 mg/dL (ref 0.3–1.2)
Total Protein: 7.9 g/dL (ref 6.5–8.1)

## 2019-06-29 LAB — VITAMIN B12: Vitamin B-12: 279 pg/mL (ref 180–914)

## 2019-06-29 LAB — IRON AND TIBC
Iron: 22 ug/dL — ABNORMAL LOW (ref 41–142)
Saturation Ratios: 8 % — ABNORMAL LOW (ref 21–57)
TIBC: 255 ug/dL (ref 236–444)
UIBC: 233 ug/dL (ref 120–384)

## 2019-06-29 LAB — FERRITIN: Ferritin: 95 ng/mL (ref 11–307)

## 2019-06-29 NOTE — Progress Notes (Signed)
HEMATOLOGY/ONCOLOGY CLINIC NOTE  Date of Service: 06/29/2019  Patient Care Team: Seward Carol, MD as PCP - General (Internal Medicine)  CHIEF COMPLAINTS/PURPOSE OF CONSULTATION:  Microcytic Anemia  HISTORY OF PRESENTING ILLNESS:   Traci [name] is a wonderful 43 y.o. female who has been referred to [place] by Dr. Merri Ray for evaluation and management of anemia. Her PCP is Dr. Delfina Redwood and her Neurologist is Dr. Tomi Likens. The pt reports that she is doing well overall.  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 She denies having a hx of anemia prior to being diagnosed in October 2019. She was not feeling fatigued when her Hgb was low in October. She denies heavy cycles, however, notes that she has irregular menstrual cycles. When she does have menstrual cycles they last 2-3 days with only the first day being heavy. She is no longer on meloxicam at this time.  She notes that she was sick recently and lost 40 lbs due to recurrent UTIs. She is not able to feel that her bladder is full, however, she wasn't diagnosed with neurogenic bladder. She is taking cranberry and probiotics pill BID, catheter twice a day, and following up with an urologist. She has had recurrent UTIs at least 1 every month. She is also on an iron pill and she has been on it for about 2.5 months at this time, this was started after her initial diagnosis. She is taking vitamin D as well. Additionally, she is taking Lyrica TID and senakot BID. She denies kidney issues.   The pt denies any new symptoms or changes in function over the past 6 months. She had skin breakdown to her left lateral lower extremity, due to wearing a boot, however, this has resolved. She denies issues with bed sores, leg swelling, recent infection, melena, blood in stools, bowel issues, joint pain/swelling, skin rashes, mouth sores, bone pain, and any other symptoms.   Most recent lab  results (07/26/18) of CBC w/diff is as follows: all values are WNL except for WBC at 11.9k, RBC at 2.81, HGB at 6.6, HCT at 20.6, MCV at 73.5, MCH at 23.4, RDW at 16.9, ANC at 7.4k. 08/03/18 Haptoglobin at 300 07/27/18 Ferritin at 322.5 07/27/18 Iron & TIBC revealed all values WNL except for Iron at 22, TIBC at 189, Transferrin saturation at 11.58 05/27/18 Hemoglobinopathy evaluation revealed HgbA at 98.1, HgbA2 at 1.9 05/27/2018, Hgb at 11.   On review of systems, pt reports she felt better after receiving the blood transfusion, however, she notes that it started her menstrual cycle, and denies abdominal pain, enlarged lymph nodes, back pain, and any other symptoms.   On PMHx the pt reports transverse myelitis, anemia x 3 months, left DVT on xarelto x 6 months (off now), sleep apnea (no sleep study yet). 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  On Family Hx the pt denies blood disorders in her family. She does have a family hx of cancer, with her extended family with cancers (uncle with leukemia and her cousin with a brain tumor), her paternal grandmother with breast cancer. Both her father and her brother have had a B12 deficiency. Her brother was paralyzed for several months due to a B12 deficiency. Both her father and her brother take B12 injections for this. She has a family hx of DM. She denies family hx of lupus or an  autoimmune disorder.   Interval History  Traci [name] is a 43 y.o. female presenting today for the management and evaluation of her microcytic anemia.The patient's last visit with [place] was on 03/29/2019. The pt reports that she is doing well overall.  The pt reports she is feeling the same.  She is still taking iron supplements BID  She is now taking a medication to help her sleep(melatonin)   Lab results today (06/29/19) of CBC w/diff and CMP is as follows: all values are WNL except for Hemoglobin at 10.6, HCT at  35.7, MCV at 77.6, MCH at 23.0, MCHC at 29.7, RDW at 18.8. PENDING Ferritin, Iron/TIBC, CMP, Vitamin B12, and Erythropoientin  On review of systems, pt reports and denies new infections, abdominal pain, and any other symptoms.    MEDICAL HISTORY:  Past Medical History:  Diagnosis Date  . H/O degenerative disc disease   . Obesity     SURGICAL HISTORY: Past Surgical History:  Procedure Laterality Date  . fibroid tumor    . KNEE ARTHROSCOPY    . TUBAL LIGATION      SOCIAL HISTORY: Social History   Socioeconomic History  . Marital status: Single    Spouse name: Not on file  . Number of children: 3  . Years of education: 64  . Highest education level: Not on file  Occupational History  . Occupation: Training and development officer  Social Needs  . Financial resource strain: Not on file  . Food insecurity    Worry: Not on file    Inability: Not on file  . Transportation needs    Medical: Not on file    Non-medical: Not on file  Tobacco Use  . Smoking status: Never Smoker  . Smokeless tobacco: Never Used  Substance and Sexual Activity  . Alcohol use: No  . Drug use: No  . Sexual activity: Not Currently    Birth control/protection: None  Lifestyle  . Physical activity    Days per week: Not on file    Minutes per session: Not on file  . Stress: Not on file  Relationships  . Social Herbalist on phone: Not on file    Gets together: Not on file    Attends religious service: Not on file    Active member of club or organization: Not on file    Attends meetings of clubs or organizations: Not on file    Relationship status: Not on file  . Intimate partner violence    Fear of current or ex partner: Not on file    Emotionally abused: Not on file    Physically abused: Not on file    Forced sexual activity: Not on file  Other Topics Concern  . Not on file  Social History Narrative   Seperated, currently in [facility].    FAMILY HISTORY: Family History  Problem  Relation Age of Onset  . Diabetes Mother   . Diabetes Father     ALLERGIES:  has No Known Allergies.  MEDICATIONS:  Current Outpatient Medications  Medication Sig Dispense Refill  . acetaminophen (TYLENOL) 500 MG tablet Take 500 mg by mouth every 4 (four) hours as needed.    . cephALEXin (KEFLEX) 500 MG capsule Take 1 capsule (500 mg total) by mouth 4 (four) times daily. (Patient not taking: Reported on 03/29/2019) 28 capsule 0  . cholecalciferol (VITAMIN D) 1000 units tablet Take 2,000 Units by mouth daily.    . Cranberry 250 MG CAPS Take 250  mg by mouth 2 (two) times daily.    . diphenhydrAMINE (BENADRYL) 25 mg capsule Take 25 mg by mouth every 6 (six) hours as needed for itching.    . famotidine (PEPCID) 20 MG tablet Take 20 mg by mouth 2 (two) times daily.    Marland Kitchen gabapentin (NEURONTIN) 300 MG capsule Take 1 capsule (300 mg total) by mouth 3 (three) times daily. 21 capsule 0  . iron polysaccharides (NIFEREX) 150 MG capsule Take 150 mg by mouth daily.    Marland Kitchen LACTOBACILLUS PO Take by mouth 2 (two) times daily.    . Loratadine 10 MG CAPS Take 10 mg by mouth daily.    . meloxicam (MOBIC) 15 MG tablet Take 1 tablet (15 mg total) by mouth daily.    . methocarbamol (ROBAXIN) 500 MG tablet Take 1 tablet (500 mg total) by mouth every 6 (six) hours as needed for muscle spasms.    . polyethylene glycol (MIRALAX / GLYCOLAX) packet Take 17 g by mouth daily. Adjust to twice a day once antibiotics discontinued. 14 each 0  . rivaroxaban (XARELTO) 20 MG TABS tablet Take 1 tablet (20 mg total) by mouth daily with supper. 30 tablet   . senna-docusate (SENOKOT-S) 8.6-50 MG tablet Take 2 tablets by mouth at bedtime. Increase to twice a day once antibiotics discontinued.    . sodium phosphate (FLEET) 7-19 GM/118ML ENEM Place 133 mLs (1 enema total) rectally daily at 6 (six) AM. (Patient not taking: Reported on 05/04/2017)  0  . traMADol (ULTRAM) 50 MG tablet Take 1 tablet (50 mg total) by mouth every 4 (four) hours  as needed for severe pain. 15 tablet 0   No current facility-administered medications for this visit.     REVIEW OF SYSTEMS:    A 10+ POINT REVIEW OF SYSTEMS WAS OBTAINED including neurology, dermatology, psychiatry, cardiac, respiratory, lymph, extremities, GI, GU, Musculoskeletal, constitutional, breasts, reproductive, HEENT.  All pertinent positives are noted in the HPI.  All others are negative.     PHYSICAL EXAMINATION: ECOG FS:2 - Symptomatic, <50% confined to bed  Vitals:   06/29/19 0908  BP: 140/82  Pulse: 82  Resp: 18  Temp: 98.2 F (36.8 C)  SpO2: 100%   Wt Readings from Last 3 Encounters:  03/29/19 260 lb (117.9 kg)  09/15/18 251 lb 9.6 oz (114.1 kg)  09/08/18 240 lb (108.9 kg)   Body mass index is 49.13 kg/m.    GENERAL:alert, in no acute distress and comfortable SKIN: no acute rashes, no significant lesions EYES: conjunctiva are pink and non-injected, sclera anicteric OROPHARYNX: MMM, no exudates, no oropharyngeal erythema or ulceration NECK: supple, no JVD LYMPH:  no palpable lymphadenopathy in the cervical, axillary or inguinal regions LUNGS: clear to auscultation b/l with normal respiratory effort HEART: regular rate & rhythm ABDOMEN:  normoactive bowel sounds , non tender, not distended. Extremity: 1-2+ pedal edema PSYCH: alert & oriented x 3 with fluent speech NEURO: no focal motor/sensory deficits    LABORATORY DATA:  I have reviewed the data as listed  . CBC Latest Ref Rng & Units 06/29/2019 03/29/2019 03/20/2019  WBC 4.0 - 10.5 K/uL 7.9 8.1 9.2  Hemoglobin 12.0 - 15.0 g/dL 10.6(L) 8.5(L) 8.3(L)  Hematocrit 36.0 - 46.0 % 35.7(L) 28.8(L) 27.8(L)  Platelets 150 - 400 K/uL 289 272 336   . CBC    Component Value Date/Time   WBC 7.9 06/29/2019 0848   RBC 4.60 06/29/2019 0848   HGB 10.6 (L) 06/29/2019 0848   HGB 8.5 (  L) 03/29/2019 1355   HCT 35.7 (L) 06/29/2019 0848   PLT 289 06/29/2019 0848   PLT 272 03/29/2019 1355   MCV 77.6 (L)  06/29/2019 0848   MCH 23.0 (L) 06/29/2019 0848   MCHC 29.7 (L) 06/29/2019 0848   RDW 18.8 (H) 06/29/2019 0848   LYMPHSABS 2.8 06/29/2019 0848   MONOABS 0.4 06/29/2019 0848   EOSABS 0.4 06/29/2019 0848   BASOSABS 0.0 06/29/2019 0848     . CMP Latest Ref Rng & Units 06/29/2019 03/29/2019 03/20/2019  Glucose 70 - 99 mg/dL 197(H) 127(H) 100(H)  BUN 6 - 20 mg/dL 36(H) 49(H) 59(H)  Creatinine 0.44 - 1.00 mg/dL 2.18(H) 2.85(H) 3.48(H)  Sodium 135 - 145 mmol/L 140 140 140  Potassium 3.5 - 5.1 mmol/L 4.0 4.0 4.3  Chloride 98 - 111 mmol/L 108 109 111  CO2 22 - 32 mmol/L 21(L) 22 19(L)  Calcium 8.9 - 10.3 mg/dL 9.0 9.5 8.8(L)  Total Protein 6.5 - 8.1 g/dL 7.9 8.2(H) 8.2(H)  Total Bilirubin 0.3 - 1.2 mg/dL 0.3 <0.2(L) 0.2(L)  Alkaline Phos 38 - 126 U/L 75 65 58  AST 15 - 41 U/L 7(L) 10(L) 8(L)  ALT 0 - 44 U/L 7 9 11    . Lab Results  Component Value Date   IRON 22 (L) 06/29/2019   TIBC 255 06/29/2019   IRONPCTSAT 8 (L) 06/29/2019   (Iron and TIBC)  Lab Results  Component Value Date   FERRITIN 95 06/29/2019   . Lab Results  Component Value Date   RETICCTPCT 3.2 (H) 03/29/2019   RBC 4.60 06/29/2019   . Lab Results  Component Value Date   LDH 170 03/29/2019   Sed rate 96  09/15/2018 Alpha Thalassemia:   08/03/18 Haptoglobin:   07/27/18 Iron, TIBC, Ferritin:   07/26/18 CBC w/diff:   05/27/18 Hemoglobinopathy Evaluation:    RADIOGRAPHIC STUDIES: I have personally reviewed the radiological images as listed and agreed with the findings in the report. No results found.  ASSESSMENT & PLAN:  43 y.o. female with  1. Microcytic Anemia Likely multifactorial but primarily due to CKD, anemia of chronic inflammation. Alpha thal trait.   2. Alpha thalassemia trait   Plan:  -Discussed pt labwork today, 06/29/19; CBC w/diff and CMP is as follows: all values are WNL except for Hemoglobin at 10.6, HCT at 35.7, MCV at 77.6, MCH at 23.0, MCHC at 29.7, RDW at 18.8. PENDING  Ferritin, Iron/TIBC, CMP, Vitamin B12, and Erythropoientin -Discussed that keeping iron levels stable will help symptoms -Discussed that hemoglobin is normalizing with iron supplements. No IV Iron needed at tis htime.  FOLLOW UP: RTC with Dr Irene Limbo with labs in 6 months    Orders Placed This Encounter  Procedures  . CBC with Differential/Platelet    Standing Status:   Future    Standing Expiration Date:   08/02/2020  . CMP ([place] only)    Standing Status:   Future    Standing Expiration Date:   06/28/2020  . Ferritin    Standing Status:   Future    Standing Expiration Date:   06/28/2020  . Iron and TIBC    Standing Status:   Future    Standing Expiration Date:   06/28/2020   The total time spent in the appt was 20 minutes and more than 50% was on counseling and direct patient cares.  All of the patient's questions were answered with apparent satisfaction. The patient knows to call the clinic with any problems, questions or  concerns.  Sullivan Lone MD MS AAHIVMS [facility]On[facility] Physician [facility]  (Office):       [phone] (Work cell):  [phone] (Fax):           [phone]  06/29/2019 9:40 AM  I, Scot Dock, am acting as a scribe for Dr. Sullivan Lone.   .I have reviewed the above documentation for accuracy and completeness, and I agree with the above. Brunetta Genera MD

## 2019-06-30 ENCOUNTER — Telehealth: Payer: Self-pay | Admitting: Hematology

## 2019-06-30 LAB — ERYTHROPOIETIN: Erythropoietin: 22.4 m[IU]/mL — ABNORMAL HIGH (ref 2.6–18.5)

## 2019-06-30 NOTE — Telephone Encounter (Signed)
Scheduled appt per 11/12 los.  Sent a staff message to get a calendar mailed out.

## 2019-09-24 DIAGNOSIS — A0472 Enterocolitis due to Clostridium difficile, not specified as recurrent: Secondary | ICD-10-CM

## 2019-09-24 HISTORY — DX: Enterocolitis due to Clostridium difficile, not specified as recurrent: A04.72

## 2019-09-28 ENCOUNTER — Inpatient Hospital Stay (HOSPITAL_COMMUNITY)
Admission: EM | Admit: 2019-09-28 | Discharge: 2019-10-03 | DRG: 981 | Disposition: A | Discharge status: Skilled Nursing Facility | Payer: Medicare Other | Attending: Internal Medicine | Admitting: Internal Medicine

## 2019-09-28 ENCOUNTER — Emergency Department (HOSPITAL_COMMUNITY): Payer: Medicare Other

## 2019-09-28 ENCOUNTER — Encounter (HOSPITAL_COMMUNITY): Payer: Self-pay | Admitting: Emergency Medicine

## 2019-09-28 ENCOUNTER — Other Ambulatory Visit: Payer: Self-pay

## 2019-09-28 DIAGNOSIS — E1122 Type 2 diabetes mellitus with diabetic chronic kidney disease: Secondary | ICD-10-CM | POA: Diagnosis present

## 2019-09-28 DIAGNOSIS — N184 Chronic kidney disease, stage 4 (severe): Secondary | ICD-10-CM | POA: Diagnosis present

## 2019-09-28 DIAGNOSIS — N136 Pyonephrosis: Secondary | ICD-10-CM | POA: Diagnosis present

## 2019-09-28 DIAGNOSIS — N319 Neuromuscular dysfunction of bladder, unspecified: Secondary | ICD-10-CM | POA: Diagnosis present

## 2019-09-28 DIAGNOSIS — N179 Acute kidney failure, unspecified: Secondary | ICD-10-CM | POA: Diagnosis present

## 2019-09-28 DIAGNOSIS — G8220 Paraplegia, unspecified: Secondary | ICD-10-CM | POA: Diagnosis present

## 2019-09-28 DIAGNOSIS — I959 Hypotension, unspecified: Secondary | ICD-10-CM | POA: Diagnosis present

## 2019-09-28 DIAGNOSIS — I129 Hypertensive chronic kidney disease with stage 1 through stage 4 chronic kidney disease, or unspecified chronic kidney disease: Secondary | ICD-10-CM | POA: Diagnosis present

## 2019-09-28 DIAGNOSIS — G373 Acute transverse myelitis in demyelinating disease of central nervous system: Secondary | ICD-10-CM | POA: Diagnosis present

## 2019-09-28 DIAGNOSIS — Y846 Urinary catheterization as the cause of abnormal reaction of the patient, or of later complication, without mention of misadventure at the time of the procedure: Secondary | ICD-10-CM | POA: Diagnosis present

## 2019-09-28 DIAGNOSIS — T83511A Infection and inflammatory reaction due to indwelling urethral catheter, initial encounter: Secondary | ICD-10-CM | POA: Diagnosis present

## 2019-09-28 DIAGNOSIS — Z79899 Other long term (current) drug therapy: Secondary | ICD-10-CM | POA: Diagnosis not present

## 2019-09-28 DIAGNOSIS — E1169 Type 2 diabetes mellitus with other specified complication: Secondary | ICD-10-CM | POA: Diagnosis present

## 2019-09-28 DIAGNOSIS — Z20822 Contact with and (suspected) exposure to covid-19: Secondary | ICD-10-CM | POA: Diagnosis present

## 2019-09-28 DIAGNOSIS — K5641 Fecal impaction: Secondary | ICD-10-CM | POA: Diagnosis present

## 2019-09-28 DIAGNOSIS — Z6841 Body Mass Index (BMI) 40.0 and over, adult: Secondary | ICD-10-CM | POA: Diagnosis not present

## 2019-09-28 DIAGNOSIS — Z833 Family history of diabetes mellitus: Secondary | ICD-10-CM | POA: Diagnosis not present

## 2019-09-28 DIAGNOSIS — D509 Iron deficiency anemia, unspecified: Secondary | ICD-10-CM | POA: Diagnosis present

## 2019-09-28 DIAGNOSIS — N390 Urinary tract infection, site not specified: Secondary | ICD-10-CM | POA: Diagnosis present

## 2019-09-28 DIAGNOSIS — Z8616 Personal history of COVID-19: Secondary | ICD-10-CM

## 2019-09-28 DIAGNOSIS — E43 Unspecified severe protein-calorie malnutrition: Secondary | ICD-10-CM | POA: Diagnosis present

## 2019-09-28 DIAGNOSIS — R6520 Severe sepsis without septic shock: Secondary | ICD-10-CM

## 2019-09-28 DIAGNOSIS — Z7901 Long term (current) use of anticoagulants: Secondary | ICD-10-CM

## 2019-09-28 DIAGNOSIS — L8990 Pressure ulcer of unspecified site, unspecified stage: Secondary | ICD-10-CM | POA: Insufficient documentation

## 2019-09-28 DIAGNOSIS — E669 Obesity, unspecified: Secondary | ICD-10-CM | POA: Diagnosis present

## 2019-09-28 DIAGNOSIS — R159 Full incontinence of feces: Secondary | ICD-10-CM | POA: Diagnosis present

## 2019-09-28 DIAGNOSIS — L89154 Pressure ulcer of sacral region, stage 4: Secondary | ICD-10-CM | POA: Diagnosis present

## 2019-09-28 DIAGNOSIS — L89892 Pressure ulcer of other site, stage 2: Secondary | ICD-10-CM | POA: Diagnosis present

## 2019-09-28 DIAGNOSIS — A0472 Enterocolitis due to Clostridium difficile, not specified as recurrent: Secondary | ICD-10-CM | POA: Diagnosis present

## 2019-09-28 DIAGNOSIS — E872 Acidosis: Secondary | ICD-10-CM | POA: Diagnosis present

## 2019-09-28 DIAGNOSIS — A419 Sepsis, unspecified organism: Secondary | ICD-10-CM | POA: Diagnosis present

## 2019-09-28 HISTORY — DX: Acute transverse myelitis in demyelinating disease of central nervous system: G37.3

## 2019-09-28 HISTORY — DX: Paraplegia, unspecified: G82.20

## 2019-09-28 LAB — I-STAT BETA HCG BLOOD, ED (MC, WL, AP ONLY): I-stat hCG, quantitative: <5 m[IU]/mL (ref <5)

## 2019-09-28 LAB — CBC WITH DIFFERENTIAL/PLATELET
Abs Immature Granulocytes: 1.19 10*3/uL — ABNORMAL HIGH (ref 0.00–0.07)
Basophils Absolute: 0.1 10*3/uL (ref 0.0–0.1)
Basophils Relative: 1 %
Eosinophils Absolute: 1.1 10*3/uL — ABNORMAL HIGH (ref 0.0–0.5)
Eosinophils Relative: 7 %
HCT: 28.2 % — ABNORMAL LOW (ref 36.0–46.0)
Hemoglobin: 8.7 g/dL — ABNORMAL LOW (ref 12.0–15.0)
Immature Granulocytes: 8 %
Lymphocytes Relative: 20 %
Lymphs Abs: 3.1 10*3/uL (ref 0.7–4.0)
MCH: 22.8 pg — ABNORMAL LOW (ref 26.0–34.0)
MCHC: 30.9 g/dL (ref 30.0–36.0)
MCV: 74 fL — ABNORMAL LOW (ref 80.0–100.0)
Monocytes Absolute: 1.1 10*3/uL — ABNORMAL HIGH (ref 0.1–1.0)
Monocytes Relative: 7 %
Neutro Abs: 9.3 10*3/uL — ABNORMAL HIGH (ref 1.7–7.7)
Neutrophils Relative %: 57 %
Platelets: 487 10*3/uL — ABNORMAL HIGH (ref 150–400)
RBC: 3.81 MIL/uL — ABNORMAL LOW (ref 3.87–5.11)
RDW: 19.1 % — ABNORMAL HIGH (ref 11.5–15.5)
WBC: 15.8 10*3/uL — ABNORMAL HIGH (ref 4.0–10.5)
nRBC: 0.2 % (ref 0.0–0.2)

## 2019-09-28 LAB — COMPREHENSIVE METABOLIC PANEL
ALT: 14 U/L (ref 0–44)
AST: 19 U/L (ref 15–41)
Albumin: 2 g/dL — ABNORMAL LOW (ref 3.5–5.0)
Alkaline Phosphatase: 63 U/L (ref 38–126)
Anion gap: 12 (ref 5–15)
BUN: 41 mg/dL — ABNORMAL HIGH (ref 6–20)
CO2: 15 mmol/L — ABNORMAL LOW (ref 22–32)
Calcium: 8.3 mg/dL — ABNORMAL LOW (ref 8.9–10.3)
Chloride: 106 mmol/L (ref 98–111)
Creatinine, Ser: 3.02 mg/dL — ABNORMAL HIGH (ref 0.44–1.00)
GFR calc Af Amer: 21 mL/min — ABNORMAL LOW (ref >60)
GFR calc non Af Amer: 18 mL/min — ABNORMAL LOW (ref >60)
Glucose, Bld: 154 mg/dL — ABNORMAL HIGH (ref 70–99)
Potassium: 3.8 mmol/L (ref 3.5–5.1)
Sodium: 133 mmol/L — ABNORMAL LOW (ref 135–145)
Total Bilirubin: 0.6 mg/dL (ref 0.3–1.2)
Total Protein: 6.1 g/dL — ABNORMAL LOW (ref 6.5–8.1)

## 2019-09-28 LAB — URINALYSIS, ROUTINE W REFLEX MICROSCOPIC
Bilirubin Urine: NEGATIVE
Glucose, UA: NEGATIVE mg/dL
Hgb urine dipstick: NEGATIVE
Ketones, ur: NEGATIVE mg/dL
Nitrite: NEGATIVE
Protein, ur: 100 mg/dL — AB
Specific Gravity, Urine: 1.008 (ref 1.005–1.030)
pH: 8 (ref 5.0–8.0)

## 2019-09-28 LAB — I-STAT CHEM 8, ED
BUN: 41 mg/dL — ABNORMAL HIGH (ref 6–20)
Calcium, Ion: 1.14 mmol/L — ABNORMAL LOW (ref 1.15–1.40)
Chloride: 105 mmol/L (ref 98–111)
Creatinine, Ser: 3.1 mg/dL — ABNORMAL HIGH (ref 0.44–1.00)
Glucose, Bld: 150 mg/dL — ABNORMAL HIGH (ref 70–99)
HCT: 27 % — ABNORMAL LOW (ref 36.0–46.0)
Hemoglobin: 9.2 g/dL — ABNORMAL LOW (ref 12.0–15.0)
Potassium: 3.8 mmol/L (ref 3.5–5.1)
Sodium: 131 mmol/L — ABNORMAL LOW (ref 135–145)
TCO2: 19 mmol/L — ABNORMAL LOW (ref 22–32)

## 2019-09-28 LAB — PROTIME-INR
INR: 1.2 (ref 0.8–1.2)
Prothrombin Time: 15.6 seconds — ABNORMAL HIGH (ref 11.4–15.2)

## 2019-09-28 LAB — LACTIC ACID, PLASMA: Lactic Acid, Venous: 1.6 mmol/L (ref 0.5–1.9)

## 2019-09-28 LAB — APTT: aPTT: 34 seconds (ref 24–36)

## 2019-09-28 MED ORDER — SODIUM CHLORIDE 0.9 % IV BOLUS
3000.0000 mL | Freq: Once | INTRAVENOUS | Status: AC
  Administered 2019-09-28: 3000 mL via INTRAVENOUS

## 2019-09-28 MED ORDER — VANCOMYCIN HCL 1.25 G IV SOLR
1250.0000 mg | Freq: Once | INTRAVENOUS | Status: AC
  Administered 2019-09-28: 23:00:00 1250 mg via INTRAVENOUS
  Filled 2019-09-28: qty 1250

## 2019-09-28 MED ORDER — SODIUM CHLORIDE 0.9 % IV SOLN
2.0000 g | Freq: Once | INTRAVENOUS | Status: AC
  Administered 2019-09-28: 21:00:00 2 g via INTRAVENOUS
  Filled 2019-09-28: qty 2

## 2019-09-28 MED ORDER — METRONIDAZOLE IN NACL 5-0.79 MG/ML-% IV SOLN
500.0000 mg | Freq: Once | INTRAVENOUS | Status: AC
  Administered 2019-09-28: 500 mg via INTRAVENOUS
  Filled 2019-09-28: qty 100

## 2019-09-28 MED ORDER — VANCOMYCIN HCL IN DEXTROSE 1-5 GM/200ML-% IV SOLN: 1000.0000 mg | Freq: Once | INTRAVENOUS | Status: DC

## 2019-09-28 MED ORDER — SODIUM CHLORIDE 0.9 % IV SOLN
2.0000 g | INTRAVENOUS | Status: DC
  Administered 2019-09-29 – 2019-10-02 (×4): 2 g via INTRAVENOUS
  Filled 2019-09-28 (×5): qty 2

## 2019-09-28 MED ORDER — VANCOMYCIN HCL 500 MG/100ML IV SOLN
500.0000 mg | INTRAVENOUS | Status: DC
  Administered 2019-09-30: 500 mg via INTRAVENOUS
  Filled 2019-09-28: qty 100

## 2019-09-28 MED ORDER — ACETAMINOPHEN 325 MG PO TABS
650.0000 mg | ORAL_TABLET | Freq: Once | ORAL | Status: AC
  Administered 2019-09-28: 650 mg via ORAL
  Filled 2019-09-28: qty 2

## 2019-09-28 NOTE — ED Notes (Signed)
Lab contacted nursing, stool sample collected "too formed to used for c. Diff testing", will recollect looser sample when available.

## 2019-09-28 NOTE — ED Triage Notes (Signed)
  Patient BIB EMS from [facility] for fecal impaction.  Patient is a paraplegic and has hx of constipation and fecal impaction.  Patient was also diagnosed with COVID in mid January and was started on vancomycin for C diff two days ago.  Patient states she has developed a sore on her bottom from the irritation and C diff.  Patient is unable to rate pain due to being paraplegic.  Patient is A&O x4.

## 2019-09-28 NOTE — ED Provider Notes (Signed)
[facility] EMERGENCY DEPARTMENT Provider Note   CSN: [id number] Arrival date & time: 09/28/19  1942    History Chief Complaint  Patient presents with  . Fecal Impaction  . Fever    Traci [name] is a 44 y.o. female with medical history significant for MS demyelination, paraplegia, transverse myelitis, neurogenic bladder, chronic Foley catheter, diabetes, CKD who presents for evaluation of abdominal pain and diarrhea.  Patient diagnosed with Covid 1 month ago.  Patient states she was diagnosed with C. difficile 2 days ago.  Has had multiple episodes of watery liquid stool.  Had 3 episodes of watery stool today.  No melena or bright red blood per rectum.  She states she was told by her facility that she has a new sore to her bottom.  Patient unable to feel from midsternum down.  States she did notice a fever last week or did not notice she was febrile here.  Patient states she has had intermittent tachycardia since her Covid diagnosis.  Denies nausea, vomiting, chest pain, shortness of breath, hemoptysis, unilateral leg swelling, redness or warmth.  She is nonverbal at baseline.  Denies additional aggravating or alleviating factors.  History obtained from patient and past medical records.  No interpreter is used.  COVID dx 1 month ago. No UR complaints  HPI     Past Medical History:  Diagnosis Date  . H/O degenerative disc disease   . Obesity     Patient Active Problem List   Diagnosis Date Noted  . Proteus infection   . Fever   . Generalized abdominal pain   . Non-intractable vomiting with nausea   . Hemorrhoids   . Neuropathic pain   . Acute blood loss anemia   . Transaminitis   . Acute lower UTI   . Hyponatremia   . Peroneal DVT (deep venous thrombosis), left ([place])   . Thoracic myelopathy   . Neurogenic bladder   . Neurogenic bowel   . Bilateral leg weakness   . CNS demyelination ([place])   . Myelitis ([place])   . Paresthesia of both lower extremities    . Morbid obesity ([place])   . Spinal stenosis of lumbar region   . DDD (degenerative disc disease), lumbosacral   . Diabetes mellitus type 2 in obese ([place])   . Paraplegia ([place])   . Transverse myelitis ([place]) 12/25/2016  . Steroid-induced hyperglycemia 12/25/2016    Past Surgical History:  Procedure Laterality Date  . fibroid tumor    . KNEE ARTHROSCOPY    . TUBAL LIGATION       OB History   No obstetric history on file.     Family History  Problem Relation Age of Onset  . Diabetes Mother   . Diabetes Father     Social History   Tobacco Use  . Smoking status: Never Smoker  . Smokeless tobacco: Never Used  Substance Use Topics  . Alcohol use: No  . Drug use: No    Home Medications Prior to Admission medications   Medication Sig Start Date End Date Taking? Authorizing Provider  acetaminophen (TYLENOL) 500 MG tablet Take 500 mg by mouth every 4 (four) hours as needed.    [provider]  cholecalciferol (VITAMIN D) 1000 units tablet Take 2,000 Units by mouth daily.    [provider]  Cranberry 250 MG CAPS Take 250 mg by mouth 2 (two) times daily.    [provider]  dexamethasone (DECADRON) 4 MG tablet Take 12 mg  by mouth every other day. 09/04/19   [provider]  diphenhydrAMINE (BENADRYL) 25 mg capsule Take 25 mg by mouth every 6 (six) hours as needed for itching.    [provider]  famotidine (PEPCID) 20 MG tablet Take 20 mg by mouth 2 (two) times daily.    [provider]  gabapentin (NEURONTIN) 300 MG capsule Take 1 capsule (300 mg total) by mouth 3 (three) times daily. 05/04/17   Pieter Partridge, DO  iron polysaccharides (NIFEREX) 150 MG capsule Take 150 mg by mouth daily.    [provider]  LACTOBACILLUS PO Take by mouth 2 (two) times daily.    [provider]  Loratadine 10 MG CAPS Take 10 mg by mouth daily.    [provider]  meloxicam (MOBIC) 15 MG tablet Take 1 tablet (15 mg  total) by mouth daily. 01/27/17   Love, Ivan Anchors, PA-C  methocarbamol (ROBAXIN) 500 MG tablet Take 1 tablet (500 mg total) by mouth every 6 (six) hours as needed for muscle spasms. 01/26/17   Love, Ivan Anchors, PA-C  polyethylene glycol (MIRALAX / GLYCOLAX) packet Take 17 g by mouth daily. Adjust to twice a day once antibiotics discontinued. 01/27/17   Love, Ivan Anchors, PA-C  rivaroxaban (XARELTO) 20 MG TABS tablet Take 1 tablet (20 mg total) by mouth daily with supper. 01/26/17   Love, Ivan Anchors, PA-C  senna-docusate (SENOKOT-S) 8.6-50 MG tablet Take 2 tablets by mouth at bedtime. Increase to twice a day once antibiotics discontinued. 01/26/17   Love, Ivan Anchors, PA-C  sodium phosphate (FLEET) 7-19 GM/118ML ENEM Place 133 mLs (1 enema total) rectally daily at 6 (six) AM. Patient not taking: Reported on 05/04/2017 01/27/17   Love, Ivan Anchors, PA-C  traMADol (ULTRAM) 50 MG tablet Take 1 tablet (50 mg total) by mouth every 4 (four) hours as needed for severe pain. 01/26/17   Love, Ivan Anchors, PA-C  vancomycin (VANCOCIN) 125 MG capsule Take 125 mg by mouth 4 (four) times daily. 09/26/19   [provider]  pregabalin (LYRICA) 100 MG capsule Take 1 capsule (100 mg total) by mouth 3 (three) times daily. 09/03/17 03/29/19  Pieter Partridge, DO    Allergies    Patient has no known allergies.  Review of Systems   Review of Systems  Constitutional: Positive for fatigue and fever.  HENT: Negative.   Respiratory: Negative.   Cardiovascular: Negative.   Gastrointestinal: Positive for diarrhea. Negative for abdominal distention, abdominal pain, anal bleeding, blood in stool, constipation, nausea, rectal pain and vomiting.  Genitourinary: Negative.   Musculoskeletal: Negative.   Skin: Positive for wound.  Neurological: Negative.   All other systems reviewed and are negative.   Physical Exam Updated Vital Signs BP 108/76   Pulse (!) 111   Temp (!) 102 F (38.9 C) (Oral)   Resp (!) 34   Ht 5' (1.524 m)   Wt 127 kg    SpO2 96%   BMI 54.68 kg/m   Physical Exam Vitals and nursing note reviewed. Exam conducted with a chaperone present.  Constitutional:      General: She is not in acute distress.    Appearance: She is well-developed. She is ill-appearing (Chronically ill appearing). She is not toxic-appearing.  HENT:     Head: Normocephalic and atraumatic.     Nose: Nose normal.     Mouth/Throat:     Mouth: Mucous membranes are moist.     Pharynx: Oropharynx is clear.  Eyes:  Pupils: Pupils are equal, round, and reactive to light.  Cardiovascular:     Rate and Rhythm: Tachycardia present.     Comments: Tachycardic to 125 in room Pulmonary:     Effort: Pulmonary effort is normal. No respiratory distress.     Breath sounds: Normal breath sounds.     Comments: RR 28 in room Abdominal:     General: There is no distension.     Comments: Distended abdomen. Indwelling foley cath in place.  Genitourinary:    Rectum: No mass, tenderness, anal fissure, external hemorrhoid or internal hemorrhoid.     Comments: Large dark brown stool on exam. No fecal impaction on exam. Musculoskeletal:        General: Normal range of motion.     Cervical back: Normal range of motion.     Comments: No movement to lower extremities at baseline. No edema, erythema, warmth.  Stage 3 sacral ulcers without drainage.  Skin:    General: Skin is warm and dry.     Comments: Brisk cap refill  Neurological:     Mental Status: She is alert and oriented to person, place, and time.     Comments: Paraplegic at baseline.    ED Results / Procedures / Treatments   Labs (all labs ordered are listed, but only abnormal results are displayed) Labs Reviewed  COMPREHENSIVE METABOLIC PANEL - Abnormal; Notable for the following components:      Result Value   Sodium 133 (*)    CO2 15 (*)    Glucose, Bld 154 (*)    BUN 41 (*)    Creatinine, Ser 3.02 (*)    Calcium 8.3 (*)    Total Protein 6.1 (*)    Albumin 2.0 (*)    GFR calc  non Af Amer 18 (*)    GFR calc Af Amer 21 (*)    All other components within normal limits  CBC WITH DIFFERENTIAL/PLATELET - Abnormal; Notable for the following components:   WBC 15.8 (*)    RBC 3.81 (*)    Hemoglobin 8.7 (*)    HCT 28.2 (*)    MCV 74.0 (*)    MCH 22.8 (*)    RDW 19.1 (*)    Platelets 487 (*)    Neutro Abs 9.3 (*)    Monocytes Absolute 1.1 (*)    Eosinophils Absolute 1.1 (*)    Abs Immature Granulocytes 1.19 (*)    All other components within normal limits  PROTIME-INR - Abnormal; Notable for the following components:   Prothrombin Time 15.6 (*)    All other components within normal limits  URINALYSIS, ROUTINE W REFLEX MICROSCOPIC - Abnormal; Notable for the following components:   APPearance CLOUDY (*)    Protein, ur 100 (*)    Leukocytes,Ua MODERATE (*)    Bacteria, UA MANY (*)    All other components within normal limits  I-STAT CHEM 8, ED - Abnormal; Notable for the following components:   Sodium 131 (*)    BUN 41 (*)    Creatinine, Ser 3.10 (*)    Glucose, Bld 150 (*)    Calcium, Ion 1.14 (*)    TCO2 19 (*)    Hemoglobin 9.2 (*)    HCT 27.0 (*)    All other components within normal limits  CULTURE, BLOOD (ROUTINE X 2)  CULTURE, BLOOD (ROUTINE X 2)  URINE CULTURE  C DIFFICILE QUICK SCREEN W PCR REFLEX  LACTIC ACID, PLASMA  APTT  I-STAT BETA HCG BLOOD,  ED (MC, WL, AP ONLY)  POC SARS CORONAVIRUS 2 AG -  ED    EKG EKG Interpretation  Date/Time:  Thursday September 28 2019 20:44:49 EST Ventricular Rate:  115 PR Interval:    QRS Duration: 83 QT Interval:  316 QTC Calculation: 437 R Axis:   43 Text Interpretation: Sinus tachycardia Borderline T abnormalities, diffuse leads No previous ECGs available Confirmed by Fredia Sorrow [phone]) on 09/28/2019 9:13:35 PM   Radiology CT ABDOMEN PELVIS WO CONTRAST  Result Date: 09/28/2019 CLINICAL DATA:  Constipation, fecal impaction, currently being treated for C difficile colitis. EXAM: CT ABDOMEN  AND PELVIS WITHOUT CONTRAST TECHNIQUE: Multidetector CT imaging of the abdomen and pelvis was performed following the standard protocol without IV contrast. COMPARISON:  03/20/2019 FINDINGS: Lower chest: No acute pleural or parenchymal lung disease. Hepatobiliary: No focal liver abnormality is seen. No gallstones, gallbladder wall thickening, or biliary dilatation. Pancreas: Unremarkable. No pancreatic ductal dilatation or surrounding inflammatory changes. Spleen: Normal in size without focal abnormality. Adrenals/Urinary Tract: Adrenals are unremarkable. There is bilateral renal cortical atrophy. Faint 3-4 mm nonobstructing calculi are seen within the right kidney. There is mild right-sided hydronephrosis and hydroureter, though I do not see a clear obstructing calculus or mass. Overall this is improved since previous study. On the left, there is minimal distension of the left renal pelvis and proximal left ureter. No evidence of left-sided calculi. The bladder is decompressed with a Foley catheter. Stomach/Bowel: There is moderate residual stool within the colon, though improved since prior study. Segmental areas of wall thickening are seen involving the cecum and sigmoid colon. Findings are consistent with multifocal colitis. No bowel obstruction. Vascular/Lymphatic: Multiple subcentimeter lymph nodes are seen within the retroperitoneum and surrounding the sigmoid colon, likely inflammatory. Vascular structures are grossly unremarkable. Reproductive: Uterus and bilateral adnexa are unremarkable. Other: There is prominent pericolonic fat stranding involving the rectosigmoid colon. No free fluid within the abdomen or pelvis. No free gas. Musculoskeletal: No acute or destructive bony lesions. Reconstructed images demonstrate no additional findings. IMPRESSION: 1. Segmental areas of colonic wall thickening consistent with colitis. By report, the patient is currently being treated for C difficile colitis. 2. Moderate  fecal retention, though decreased stool since prior study. 3. Nonobstructing right renal calculi. There is dilatation of the bilateral renal pelves and ureters, right greater than left, though less pronounced than on prior study. This may be related underlying neurogenic bladder. 4. Pericolonic fat stranding surrounding the sigmoid colon, with multiple small lymph nodes, likely reactive. Electronically Signed   By: Randa Ngo M.D.   On: 09/28/2019 22:21   DG Chest Port 1 View  Result Date: 09/28/2019 CLINICAL DATA:  Sepsis EXAM: PORTABLE CHEST 1 VIEW COMPARISON:  10/13/2016 FINDINGS: The heart size and mediastinal contours are within normal limits. Both lungs are clear. The visualized skeletal structures are unremarkable. IMPRESSION: No active disease. Electronically Signed   By: Ulyses Jarred M.D.   On: 09/28/2019 21:04    Procedures .Critical Care Performed by: Nettie Elm, PA-C Authorized by: Nettie Elm, PA-C   Critical care provider statement:    Critical care time (minutes):  61   Critical care was necessary to treat or prevent imminent or life-threatening deterioration of the following conditions:  Sepsis   Critical care was time spent personally by me on the following activities:  Discussions with consultants, evaluation of patient's response to treatment, examination of patient, ordering and performing treatments and interventions, ordering and review of laboratory studies, ordering and review  of radiographic studies, pulse oximetry, re-evaluation of patient's condition, obtaining history from patient or surrogate and review of old charts   (including critical care time)  Medications Ordered in ED Medications  Vancomycin (VANCOCIN) 1,250 mg in sodium chloride 0.9 % 250 mL IVPB (1,250 mg Intravenous New Bag/Given 09/28/19 2236)  vancomycin (VANCOREADY) IVPB 500 mg/100 mL (has no administration in time range)  ceFEPIme (MAXIPIME) 2 g in sodium chloride 0.9 % 100 mL IVPB  (has no administration in time range)  ceFEPIme (MAXIPIME) 2 g in sodium chloride 0.9 % 100 mL IVPB (0 g Intravenous Stopped 09/28/19 2126)  metroNIDAZOLE (FLAGYL) IVPB 500 mg (0 mg Intravenous Stopped 09/28/19 2235)  sodium chloride 0.9 % bolus 3,000 mL (3,000 mLs Intravenous New Bag/Given 09/28/19 2049)  acetaminophen (TYLENOL) tablet 650 mg (650 mg Oral Given 09/28/19 2050)   ED Course  I have reviewed the triage vital signs and the nursing notes.  Pertinent labs & imaging results that were available during my care of the patient were reviewed by me and considered in my medical decision making (see chart for details).  44 year old complex medical history presents for evaluation of possible fecal impaction and fever.  She is febrile, tachycardic and tachypneic here in the emergency department.  Patient recently diagnosed with Covid 1 month ago and with C. difficile 2 days ago per facility.  I do not see results of either of these in epic.  Patient with 3 episodes of watery loose stool today.  Patient with history of frequent UTIs due to indwelling Foley catheter.  Code sepsis called.  IV fluids, broad-spectrum antibiotics given.  Patient with soft blood pressures on arrival.  Will need admission for sepsis.  Patient with Covid 1 month ago however no upper respiratory complaints.  Labs and imaging personally reviewed and interpreted: CBC with leukocytosis at 15.8, hemoglobin 8.7 I-STAT Chem-8 with hyponatremia at 131, creatinine 3.10 Pregnancy test negative EKG sinus tachycardia, No STEMI Urinalysis with many bacteria, moderate leuks. Ct with colitis  2110: Re-evaluation patient continues to be tachycardic and tachypneic.  Systolic blood pressure to 100, maps continue to be greater than 65.  GU exam without any impaction, actually patient actively having very large bowel movement during exam without any difficulty.  Patient with multiple stage III sacral ulcers without any drainage.  There is some  mild surrounding erythema however no fluctuance, induration.  CT with evidence of colitis.  Patient continues to be tachycardic and tachypneic.  She denies any chest pain, shortness of breath.  Low suspicion for PE however this cannot be ruled out given AKI and ultrasound not present throughout DVT.  Higher suspicion given her leukocytosis, known C. difficile colitis as source of her tachycardia and tachypnea.  CONSULT with [facility] Maudie Mercury with [facility] who will evaluate patient for admission.  The patient appears reasonably stabilized for admission considering the current resources, flow, and capabilities available in the ED at this time, and I doubt any other [facility] requiring further screening and/or treatment in the ED prior to admission.       MDM Rules/Calculators/A&P                       Final Clinical Impression(s) / ED Diagnoses Final diagnoses:  Sepsis with acute renal failure without septic shock, due to unspecified organism, unspecified acute renal failure type ([place])  C. difficile colitis  AKI (acute kidney injury) ([place])  Urinary tract infection associated with indwelling urethral catheter, initial encounter ([place])  Rx / DC Orders ED Discharge Orders    None       Jermany Rimel A, PA-C 09/28/19 2258    Fredia Sorrow, MD 09/28/19 2321

## 2019-09-28 NOTE — Progress Notes (Signed)
Pharmacy Antibiotic Note  Traci [name] is a 44 y.o. female admitted on 09/28/2019 with sepsis.  Patient has recently been diagnosed with C. Difficile infection prescribed oral vancomycin filled on 09/25/2018. Pharmacy has been consulted for vancomycin and cefepime dosing.   Scr 3.1, estimated CrCl 28.3 ml/min. WBC elevated at 15.8. Tmax 102. Patient was hypotensive on arrival.   Plan: - Vancomycin loading dose 1250 mg IV x1  - Vancomycin maintenance dose 500 mg IV q24hr. Scr used 3.1. Est AUC 435. Goal AUC 400-550.  - Flagyl per MD - Monitor renal function, c/s, vancomycin levels at steady state - F/u C. difficile treatment and antimicrobial deescalation/discontinuation as appropriate    Height: 5' (152.4 cm) Weight: 259 lb 14.8 oz (117.9 kg) IBW/kg (Calculated) : 45.5  Temp (24hrs), Avg:102 F (38.9 C), Min:102 F (38.9 C), Max:102 F (38.9 C)  No results for input(s): WBC, CREATININE, LATICACIDVEN, VANCOTROUGH, VANCOPEAK, VANCORANDOM, GENTTROUGH, GENTPEAK, GENTRANDOM, TOBRATROUGH, TOBRAPEAK, TOBRARND, AMIKACINPEAK, AMIKACINTROU, AMIKACIN in the last 168 hours.  CrCl cannot be calculated (Patient's most recent lab result is older than the maximum 21 days allowed.).    No Known Allergies  Antimicrobials this admission: Vancomycin 2/11 >>  Cefepime 2/11 >>  Flagyl 2/11 x1  Dose adjustments this admission:  Microbiology results: 2/11 BCx: sent 2/11 UCx: sent  2/11 Cdiff PCR: sent  Thank you for allowing pharmacy to be a part of this patient's care.  Agnes Lawrence, PharmD PGY1 Pharmacy Resident

## 2019-09-29 ENCOUNTER — Inpatient Hospital Stay (HOSPITAL_COMMUNITY): Payer: Medicare Other

## 2019-09-29 ENCOUNTER — Encounter (HOSPITAL_COMMUNITY): Payer: Self-pay | Admitting: Internal Medicine

## 2019-09-29 DIAGNOSIS — A0472 Enterocolitis due to Clostridium difficile, not specified as recurrent: Secondary | ICD-10-CM | POA: Diagnosis present

## 2019-09-29 LAB — CBC
HCT: 25.8 % — ABNORMAL LOW (ref 36.0–46.0)
Hemoglobin: 8 g/dL — ABNORMAL LOW (ref 12.0–15.0)
MCH: 22.9 pg — ABNORMAL LOW (ref 26.0–34.0)
MCHC: 31 g/dL (ref 30.0–36.0)
MCV: 73.9 fL — ABNORMAL LOW (ref 80.0–100.0)
Platelets: 474 10*3/uL — ABNORMAL HIGH (ref 150–400)
RBC: 3.49 MIL/uL — ABNORMAL LOW (ref 3.87–5.11)
RDW: 19.5 % — ABNORMAL HIGH (ref 11.5–15.5)
WBC: 17.4 10*3/uL — ABNORMAL HIGH (ref 4.0–10.5)
nRBC: 0.2 % (ref 0.0–0.2)

## 2019-09-29 LAB — COMPREHENSIVE METABOLIC PANEL
ALT: 11 U/L (ref 0–44)
AST: 10 U/L — ABNORMAL LOW (ref 15–41)
Albumin: 1.8 g/dL — ABNORMAL LOW (ref 3.5–5.0)
Alkaline Phosphatase: 61 U/L (ref 38–126)
Anion gap: 12 (ref 5–15)
BUN: 42 mg/dL — ABNORMAL HIGH (ref 6–20)
CO2: 13 mmol/L — ABNORMAL LOW (ref 22–32)
Calcium: 7.9 mg/dL — ABNORMAL LOW (ref 8.9–10.3)
Chloride: 111 mmol/L (ref 98–111)
Creatinine, Ser: 2.86 mg/dL — ABNORMAL HIGH (ref 0.44–1.00)
GFR calc Af Amer: 22 mL/min — ABNORMAL LOW (ref >60)
GFR calc non Af Amer: 19 mL/min — ABNORMAL LOW (ref >60)
Glucose, Bld: 145 mg/dL — ABNORMAL HIGH (ref 70–99)
Potassium: 3.8 mmol/L (ref 3.5–5.1)
Sodium: 136 mmol/L (ref 135–145)
Total Bilirubin: 0.7 mg/dL (ref 0.3–1.2)
Total Protein: 5.8 g/dL — ABNORMAL LOW (ref 6.5–8.1)

## 2019-09-29 LAB — URINE CULTURE

## 2019-09-29 LAB — IRON AND TIBC
Iron: 16 ug/dL — ABNORMAL LOW (ref 28–170)
Saturation Ratios: 12 % (ref 10.4–31.8)
TIBC: 134 ug/dL — ABNORMAL LOW (ref 250–450)
UIBC: 118 ug/dL

## 2019-09-29 LAB — TROPONIN I (HIGH SENSITIVITY): Troponin I (High Sensitivity): 17 ng/L (ref <18)

## 2019-09-29 LAB — FERRITIN: Ferritin: 416 ng/mL — ABNORMAL HIGH (ref 11–307)

## 2019-09-29 LAB — RESPIRATORY PANEL BY RT PCR (FLU A&B, COVID)
Influenza A by PCR: NEGATIVE
Influenza B by PCR: NEGATIVE
SARS Coronavirus 2 by RT PCR: NEGATIVE

## 2019-09-29 LAB — CORTISOL: Cortisol, Plasma: 17.8 ug/dL

## 2019-09-29 LAB — SARS CORONAVIRUS 2 (TAT 6-24 HRS): SARS Coronavirus 2: NEGATIVE

## 2019-09-29 LAB — MRSA PCR SCREENING: MRSA by PCR: NEGATIVE

## 2019-09-29 LAB — HIV ANTIBODY (ROUTINE TESTING W REFLEX): HIV Screen 4th Generation wRfx: NONREACTIVE

## 2019-09-29 MED ORDER — METRONIDAZOLE IN NACL 5-0.79 MG/ML-% IV SOLN
500.0000 mg | Freq: Three times a day (TID) | INTRAVENOUS | Status: DC
  Administered 2019-09-29: 05:00:00 500 mg via INTRAVENOUS
  Filled 2019-09-29: qty 100

## 2019-09-29 MED ORDER — HEPARIN SODIUM (PORCINE) 5000 UNIT/ML IJ SOLN
5000.0000 [IU] | Freq: Three times a day (TID) | INTRAMUSCULAR | Status: DC
  Administered 2019-09-29 – 2019-10-02 (×11): 5000 [IU] via SUBCUTANEOUS
  Filled 2019-09-29 (×11): qty 1

## 2019-09-29 MED ORDER — ACETAMINOPHEN 650 MG RE SUPP: 650.0000 mg | Freq: Four times a day (QID) | RECTAL | Status: DC | PRN

## 2019-09-29 MED ORDER — ACETAMINOPHEN 325 MG PO TABS
650.0000 mg | ORAL_TABLET | Freq: Four times a day (QID) | ORAL | Status: DC | PRN
  Administered 2019-09-29: 650 mg via ORAL
  Filled 2019-09-29: qty 2

## 2019-09-29 MED ORDER — SODIUM BICARBONATE-DEXTROSE 150-5 MEQ/L-% IV SOLN
150.0000 meq | INTRAVENOUS | Status: DC
  Administered 2019-09-29: 150 meq via INTRAVENOUS
  Filled 2019-09-29 (×2): qty 1000

## 2019-09-29 MED ORDER — CHLORHEXIDINE GLUCONATE CLOTH 2 % EX PADS
6.0000 | MEDICATED_PAD | Freq: Every day | CUTANEOUS | Status: DC
  Administered 2019-09-29 – 2019-10-03 (×5): 6 via TOPICAL

## 2019-09-29 MED ORDER — SODIUM BICARBONATE 8.4 % IV SOLN
INTRAVENOUS | Status: DC
  Filled 2019-09-29 (×3): qty 150

## 2019-09-29 MED ORDER — VANCOMYCIN 50 MG/ML ORAL SOLUTION
125.0000 mg | Freq: Three times a day (TID) | ORAL | Status: DC
  Administered 2019-09-29 – 2019-10-03 (×18): 125 mg via ORAL
  Filled 2019-09-29 (×22): qty 2.5

## 2019-09-29 MED ORDER — SODIUM CHLORIDE 0.9 % IV SOLN: INTRAVENOUS | Status: DC

## 2019-09-29 MED ORDER — PRO-STAT SUGAR FREE PO LIQD
30.0000 mL | Freq: Two times a day (BID) | ORAL | Status: DC
  Administered 2019-09-29 – 2019-10-03 (×9): 30 mL via ORAL
  Filled 2019-09-29 (×9): qty 30

## 2019-09-29 NOTE — Consult Note (Addendum)
[facility] Nurse Consult Note: Reason for Consult: Consult requested for sacrum and buttocks. Pt has been incontinent of large amt of stool and it has soiled the wounds; it will be difficult to keep this from occurring since she is frequently having loose stools, but also has large formed stools mixed together with this and a Flexiseal cannot be used to contain the incontinence.  Wound type: There are 2 stage 4 wounds which are located over the sacrum and communicate underneath the skin level.  Upper wound is 3X2X4cm, 50% loose eschar over the wound, 50% dark brown wound bed Lower wound is 1X4X4cm, 100% dark brown wound bed.  Large amt tan drainage with strong foul odor.  Bone palpable with a swab.  Undermining towards rectum is 3 cm. Lower buttocks near perineum with 2 areas of stage 2 wounds: 2X.2X.1cm and 1X2X.1cm, both are pink and moist Left posterior ischium with stage 2 pressure injury; 2.2X2.2X.1cm, pink and dry Right posterior ischium with stage 2 pressure injury; 1X1X.1cm, pink and dry Pressure Injury POA: Yes Dressing procedure/placement/frequency: Secure chat sent to primary team to request a surgical consult for debridement of nonviable skin to sacrum wounds; pt could also benefit from an X-ray or MRI  To r/o osteomyelitis, since there is exposed bone. Topical treatment orders provided for bedside nurses to perform daily as follows: Apply moist gauze packing to 2 wounds on sacrum Q day, then cover with gauze and tape. Leave dressings off the wounds near the rectum and apply barrier cream with each turning or cleaning session. Air mattress to reduce pressure. Please refer to the surgical team for further plan of care.  Please re-consult if further assistance is needed.  Thank-you,  Julien Girt MSN, [place], [place], [place], [place]

## 2019-09-29 NOTE — Progress Notes (Signed)
PROGRESS NOTE    Traci [name]  [id number] DOB: 1976-08-17 DOA: 09/28/2019 PCP: Seward Carol, MD   Brief Narrative: Patient is a 44 year old female with history of CKD stage IV, diabetes type 2, transverse myelitis/paraplegia/neurogenic bladder, recent history of C. difficile colitis on oral vancomycin since 09/26/2019 who presents from [facility] with complaints of bilateral lower abdominal discomfort.  She was diagnosed with Covid in mid January and she has recovered.  She is paraplegic at baseline.She was having diarrhea.  CT imaging done here showed segmental areas of colonic wall thickening consistent with colitis, moderate fecal retention, and non obstructing right renal calculi.  Patient was febrile, tachycardic, hypotensive and had leukocytosis on presentation.  Admitted for management of sepsis.  Assessment & Plan:   Principal Problem:   Sepsis ([place]) Active Problems:   Transverse myelitis ([place])   Diabetes mellitus type 2 in obese ([facility])   Acute lower UTI   C. difficile diarrhea   Sepsis: Present on admission.  Was febrile, tachycardic, hypotensive.  Has leukocytosis.  Blood cultures have been sent.  Continue IV vancomycin, IV cefepime.Source cud be infected sacral ulcer are from C. Difficile itself.  Recent C. difficile colitis: Was on oral vancomycin which was started on 09/26/2019.  She was having diarrhea.This is her second episode of C. difficile colitis CT imaging showed colitis.  She was complaining of abdominal pain in the nursing facilty.  Continue oral vancomycin.  Patient had been having large loose  bowel movements in the emergency department.  Hypertension: Currently blood pressure low.  Antihypertensives on hold  Suspected UTI: Urine culture pending.  Continue current antibiotics.  She has a chronic indwelling Foley catheter.Urine looks turbid  AKI on CKD stage IV: Her baseline creatinine ranges from 2.5-2.8.  Might have mild AKI on presentation.  Has  metabolic acidosis with CO2 of just 13.  Continue bicarb drip for today.  Severe protein calorie malnutrition: Dietitian will be consulted.  Chronic iron deficiency anemia: Currently H&H stable.  She has chronic iron deficiency and follows with hematology as an outpatient.We will check iron studies   Diabetes type 2: Continue sliding scale insulin  Stage III -IV sacral ulcer on the perianal area: Present on admission.; wound care consulted.  General surgery consulted and they did bedside debridement.  No need of extensive debridement at present.  Will check MRI of the sacrum to rule out osteomyelitis.  Continue wound care         DVT prophylaxis:Heparin Sims Code Status: Full Family Communication: None present at the bedside Disposition Plan: Patient is from skilled nursing facility.  She is not clinically ready for discharge because of ongoing diarrhea, AKI, suspected sepsis.  She will be discharged to skilled nursing facility when medically stable.   Consultants: general surgery  Procedures: Bedside sacral wound debridement  Antimicrobials:  Anti-infectives (From admission, onward)   Start     Dose/Rate Route Frequency Ordered Stop   09/30/19 0600  vancomycin (VANCOREADY) IVPB 500 mg/100 mL     500 mg 100 mL/hr over 60 Minutes Intravenous Every 24 hours 09/28/19 2142     09/29/19 2200  ceFEPIme (MAXIPIME) 2 g in sodium chloride 0.9 % 100 mL IVPB     2 g 200 mL/hr over 30 Minutes Intravenous Every 24 hours 09/28/19 2142     09/29/19 0600  metroNIDAZOLE (FLAGYL) IVPB 500 mg     500 mg 100 mL/hr over 60 Minutes Intravenous Every 8 hours 09/29/19 0112     09/29/19 0200  vancomycin (VANCOCIN) 50 mg/mL oral solution 125 mg     125 mg Oral 3 times daily before meals & bedtime 09/29/19 0112     09/28/19 2030  ceFEPIme (MAXIPIME) 2 g in sodium chloride 0.9 % 100 mL IVPB     2 g 200 mL/hr over 30 Minutes Intravenous  Once 09/28/19 2017 09/28/19 2126   09/28/19 2030  metroNIDAZOLE  (FLAGYL) IVPB 500 mg     500 mg 100 mL/hr over 60 Minutes Intravenous  Once 09/28/19 2017 09/28/19 2235   09/28/19 2030  vancomycin (VANCOCIN) IVPB 1000 mg/200 mL premix  Status:  Discontinued     1,000 mg 200 mL/hr over 60 Minutes Intravenous  Once 09/28/19 2017 09/28/19 2020   09/28/19 2030  Vancomycin (VANCOCIN) 1,250 mg in sodium chloride 0.9 % 250 mL IVPB     1,250 mg 166.7 mL/hr over 90 Minutes Intravenous  Once 09/28/19 2020 09/29/19 0040      Subjective:  Patient seen and examined at the bedside this morning.  She looked comfortable.  Was having loose stools during my evaluation.  Her abdomen pain was better since presentation.  Blood pressure was soft.  She was not in any kind of distress.   Objective: Vitals:   09/29/19 0415 09/29/19 0500 09/29/19 0545 09/29/19 0630  BP: 112/65 (!) 110/52 (!) 92/49 (!) 100/59  Pulse: (!) 109 (!) 107 (!) 104 (!) 102  Resp: 20 (!) 24 (!) 36 (!) 21  Temp: 98.4 F (36.9 C)     TempSrc: Oral     SpO2: (!) 89% 96% 97% 96%  Weight:      Height:        Intake/Output Summary (Last 24 hours) at 09/29/2019 [id number] Last data filed at 09/28/2019 2235 Gross per 24 hour  Intake 200 ml  Output --  Net 200 ml   Filed Weights   09/28/19 2005 09/28/19 2129  Weight: 117.9 kg 127 kg    Examination:  General exam: Morbidly obese, not in distress HEENT:PERRL,Oral mucosa moist, Ear/Nose normal on gross exam Respiratory system: Bilateral equal air entry, normal vesicular breath sounds, no wheezes or crackles  Cardiovascular system: S1 & S2 heard, RRR. No JVD, murmurs, rubs, gallops or clicks. Trace pedal edema. Gastrointestinal system: Abdomen is obese, soft with no obvious tenderness. No organomegaly or masses felt. Normal bowel sounds heard. Central nervous system: Alert and oriented.Paraplegia Extremities: Trace bilateral lower extremity edema, no clubbing ,no cyanosi Skin: Stage 3-4 to sacral ulcers.     Data Reviewed: I have personally  reviewed following labs and imaging studies  CBC: Recent Labs  Lab 09/28/19 2026 09/28/19 2050 09/29/19 0356  WBC 15.8*  --  17.4*  NEUTROABS 9.3*  --   --   HGB 8.7* 9.2* 8.0*  HCT 28.2* 27.0* 25.8*  MCV 74.0*  --  73.9*  PLT 487*  --  123XX123*   Basic Metabolic Panel: Recent Labs  Lab 09/28/19 2026 09/28/19 2050 09/29/19 0356  NA 133* 131* 136  K 3.8 3.8 3.8  CL 106 105 111  CO2 15*  --  13*  GLUCOSE 154* 150* 145*  BUN 41* 41* 42*  CREATININE 3.02* 3.10* 2.86*  CALCIUM 8.3*  --  7.9*   GFR: Estimated Creatinine Clearance: 30.6 mL/min (A) (by C-G formula based on SCr of 2.86 mg/dL (H)). Liver Function Tests: Recent Labs  Lab 09/28/19 2026 09/29/19 0356  AST 19 10*  ALT 14 11  ALKPHOS 63 61  BILITOT 0.6 0.7  PROT 6.1* 5.8*  ALBUMIN 2.0* 1.8*   No results for input(s): LIPASE, AMYLASE in the last 168 hours. No results for input(s): AMMONIA in the last 168 hours. Coagulation Profile: Recent Labs  Lab 09/28/19 2026  INR 1.2   Cardiac Enzymes: No results for input(s): CKTOTAL, CKMB, CKMBINDEX, TROPONINI in the last 168 hours. BNP (last 3 results) No results for input(s): PROBNP in the last 8760 hours. HbA1C: No results for input(s): HGBA1C in the last 72 hours. CBG: No results for input(s): GLUCAP in the last 168 hours. Lipid Profile: No results for input(s): CHOL, HDL, LDLCALC, TRIG, CHOLHDL, LDLDIRECT in the last 72 hours. Thyroid Function Tests: No results for input(s): TSH, T4TOTAL, FREET4, T3FREE, THYROIDAB in the last 72 hours. Anemia Panel: No results for input(s): VITAMINB12, FOLATE, FERRITIN, TIBC, IRON, RETICCTPCT in the last 72 hours. Sepsis Labs: Recent Labs  Lab 09/28/19 2026  LATICACIDVEN 1.6    Recent Results (from the past 240 hour(s))  SARS CORONAVIRUS 2 (TAT 6-24 HRS) Nasopharyngeal Nasopharyngeal Swab     Status: None   Collection Time: 09/28/19  1:05 AM   Specimen: Nasopharyngeal Swab  Result Value Ref Range Status   SARS  Coronavirus 2 NEGATIVE NEGATIVE Final    Comment: (NOTE) SARS-CoV-2 target nucleic acids are NOT DETECTED. The SARS-CoV-2 RNA is generally detectable in upper and lower respiratory specimens during the acute phase of infection. Negative results do not preclude SARS-CoV-2 infection, do not rule out co-infections with other pathogens, and should not be used as the sole basis for treatment or other patient management decisions. Negative results must be combined with clinical observations, patient history, and epidemiological information. The expected result is Negative. Fact Sheet for Patients: SugarRoll.be Fact Sheet for Healthcare Providers: https://www.woods-mathews.com/ This test is not yet approved or cleared by the [place] FDA and  has been authorized for detection and/or diagnosis of SARS-CoV-2 by FDA under an Emergency Use Authorization (EUA). This EUA will remain  in effect (meaning this test can be used) for the duration of the COVID-19 declaration under Section 56 4(b)(1) of the Act, 21 U.S.C. section 360bbb-3(b)(1), unless the authorization is terminated or revoked sooner. Performed at [facility], [place] [address], [place], [place] [address]   Respiratory Panel by RT PCR (Flu A&B, Covid) - Nasopharyngeal Swab     Status: None   Collection Time: 09/29/19  1:29 AM   Specimen: Nasopharyngeal Swab  Result Value Ref Range Status   SARS Coronavirus 2 by RT PCR NEGATIVE NEGATIVE Final    Comment: (NOTE) SARS-CoV-2 target nucleic acids are NOT DETECTED. The SARS-CoV-2 RNA is generally detectable in upper respiratoy specimens during the acute phase of infection. The lowest concentration of SARS-CoV-2 viral copies this assay can detect is 131 copies/mL. A negative result does not preclude SARS-Cov-2 infection and should not be used as the sole basis for treatment or other patient management decisions. A negative result may occur  with  improper specimen collection/handling, submission of specimen other than nasopharyngeal swab, presence of viral mutation(s) within the areas targeted by this assay, and inadequate number of viral copies (<131 copies/mL). A negative result must be combined with clinical observations, patient history, and epidemiological information. The expected result is Negative. Fact Sheet for Patients:  PinkCheek.be Fact Sheet for Healthcare Providers:  GravelBags.it This test is not yet ap proved or cleared by the [place] FDA and  has been authorized for detection and/or diagnosis of SARS-CoV-2 by FDA under an Emergency Use Authorization (  EUA). This EUA will remain  in effect (meaning this test can be used) for the duration of the COVID-19 declaration under Section 564(b)(1) of the Act, 21 U.S.C. section 360bbb-3(b)(1), unless the authorization is terminated or revoked sooner.    Influenza A by PCR NEGATIVE NEGATIVE Final   Influenza B by PCR NEGATIVE NEGATIVE Final    Comment: (NOTE) The Xpert Xpress SARS-CoV-2/FLU/RSV assay is intended as an aid in  the diagnosis of influenza from Nasopharyngeal swab specimens and  should not be used as a sole basis for treatment. Nasal washings and  aspirates are unacceptable for Xpert Xpress SARS-CoV-2/FLU/RSV  testing. Fact Sheet for Patients: PinkCheek.be Fact Sheet for Healthcare Providers: GravelBags.it This test is not yet approved or cleared by the [place] FDA and  has been authorized for detection and/or diagnosis of SARS-CoV-2 by  FDA under an Emergency Use Authorization (EUA). This EUA will remain  in effect (meaning this test can be used) for the duration of the  Covid-19 declaration under Section 564(b)(1) of the Act, 21  U.S.C. section 360bbb-3(b)(1), unless the authorization is  terminated or revoked. Performed  at [facility], [place] [address] [place], [place] [address]   MRSA PCR Screening     Status: None   Collection Time: 09/29/19  4:47 AM   Specimen: Nasal Mucosa; Nasopharyngeal  Result Value Ref Range Status   MRSA by PCR NEGATIVE NEGATIVE Final    Comment:        The GeneXpert MRSA Assay (FDA approved for NASAL specimens only), is one component of a comprehensive MRSA colonization surveillance program. It is not intended to diagnose MRSA infection nor to guide or monitor treatment for MRSA infections. Performed at [facility], [place] [address] [place], [place] [address]          Radiology Studies: CT ABDOMEN PELVIS WO CONTRAST  Result Date: 09/28/2019 CLINICAL DATA:  Constipation, fecal impaction, currently being treated for C difficile colitis. EXAM: CT ABDOMEN AND PELVIS WITHOUT CONTRAST TECHNIQUE: Multidetector CT imaging of the abdomen and pelvis was performed following the standard protocol without IV contrast. COMPARISON:  03/20/2019 FINDINGS: Lower chest: No acute pleural or parenchymal lung disease. Hepatobiliary: No focal liver abnormality is seen. No gallstones, gallbladder wall thickening, or biliary dilatation. Pancreas: Unremarkable. No pancreatic ductal dilatation or surrounding inflammatory changes. Spleen: Normal in size without focal abnormality. Adrenals/Urinary Tract: Adrenals are unremarkable. There is bilateral renal cortical atrophy. Faint 3-4 mm nonobstructing calculi are seen within the right kidney. There is mild right-sided hydronephrosis and hydroureter, though I do not see a clear obstructing calculus or mass. Overall this is improved since previous study. On the left, there is minimal distension of the left renal pelvis and proximal left ureter. No evidence of left-sided calculi. The bladder is decompressed with a Foley catheter. Stomach/Bowel: There is moderate residual stool within the colon, though improved since prior study. Segmental areas of  wall thickening are seen involving the cecum and sigmoid colon. Findings are consistent with multifocal colitis. No bowel obstruction. Vascular/Lymphatic: Multiple subcentimeter lymph nodes are seen within the retroperitoneum and surrounding the sigmoid colon, likely inflammatory. Vascular structures are grossly unremarkable. Reproductive: Uterus and bilateral adnexa are unremarkable. Other: There is prominent pericolonic fat stranding involving the rectosigmoid colon. No free fluid within the abdomen or pelvis. No free gas. Musculoskeletal: No acute or destructive bony lesions. Reconstructed images demonstrate no additional findings. IMPRESSION: 1. Segmental areas of colonic wall thickening consistent with colitis. By report, the  patient is currently being treated for C difficile colitis. 2. Moderate fecal retention, though decreased stool since prior study. 3. Nonobstructing right renal calculi. There is dilatation of the bilateral renal pelves and ureters, right greater than left, though less pronounced than on prior study. This may be related underlying neurogenic bladder. 4. Pericolonic fat stranding surrounding the sigmoid colon, with multiple small lymph nodes, likely reactive. Electronically Signed   By: Randa Ngo M.D.   On: 09/28/2019 22:21   DG Chest Port 1 View  Result Date: 09/28/2019 CLINICAL DATA:  Sepsis EXAM: PORTABLE CHEST 1 VIEW COMPARISON:  10/13/2016 FINDINGS: The heart size and mediastinal contours are within normal limits. Both lungs are clear. The visualized skeletal structures are unremarkable. IMPRESSION: No active disease. Electronically Signed   By: Ulyses Jarred M.D.   On: 09/28/2019 21:04        Scheduled Meds: . feeding supplement (PRO-STAT SUGAR FREE 64)  30 mL Oral BID  . heparin  5,000 Units Subcutaneous Q8H  . vancomycin  125 mg Oral TID AC & HS   Continuous Infusions: . ceFEPime (MAXIPIME) IV    . metronidazole Stopped (09/29/19 [id number])  . sodium bicarbonate  150 mEq in dextrose 5% 1000 mL    . [START ON 09/30/2019] vancomycin       LOS: 1 day    Time spent: 35 mins.More than 50% of that time was spent in counseling and/or coordination of care.      Shelly Coss, MD [facility] P2/07/2020, 8:42 AM

## 2019-09-29 NOTE — ED Notes (Signed)
Patient resting comfortably with eyes closed.  Given fluids as requested.  Denies any other needs or complaints at this time.

## 2019-09-29 NOTE — ED Notes (Signed)
Breakfast Ordered 

## 2019-09-29 NOTE — Consult Note (Signed)
[facility] Surgery Consult Note  Jniya Kilgo 11/18/1975  [id number].    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 Patient diagnosed with Covid 1 month ago.  Patient states she was diagnosed with C. difficile 2 days ago for which she is taking oral vancomycin. She is having multiple episodes of loose watery stools daily. No blood in stool. Denies any current abdominal pain, nausea, vomiting. Noted to have a wound over her sacrum within the last week. States that she has never had a wound here before. She does not have sensation here so it does not cause discomfort. General surgery asked to evaluate.  Review of Systems  Constitutional: Negative.   HENT: Negative.   Eyes: Negative.   Respiratory: Negative.   Cardiovascular: Negative.   Gastrointestinal: Positive for diarrhea. Negative for abdominal pain, nausea and vomiting.  Genitourinary: Negative.   Musculoskeletal: Positive for back pain.  Skin:       Sacral wound  Neurological: Positive for sensory change and weakness.    All systems reviewed and otherwise negative except for as above  Family History  Problem Relation Age of Onset  . Diabetes Mother   . Diabetes Father     Past Medical History:  Diagnosis Date  . C. difficile colitis 09/24/2019  . H/O degenerative disc disease   . Obesity   . Paraplegia ([place])   . Transverse myelitis [facility])     Past Surgical History:  Procedure Laterality Date  . fibroid tumor    . KNEE ARTHROSCOPY    . TUBAL LIGATION      Social History:  reports that she has never smoked. She has never used smokeless tobacco. She reports that she does not drink alcohol or use drugs.  Allergies: No Known Allergies  (Not in a  hospital admission)   Prior to Admission medications   Medication Sig Start Date End Date Taking? Authorizing Provider  acetaminophen (TYLENOL) 325 MG tablet Take 650 mg by mouth every 6 (six) hours as needed for mild pain.    Yes [provider]  albuterol (VENTOLIN HFA) 108 (90 Base) MCG/ACT inhaler Inhale 2 puffs into the lungs every 6 (six) hours as needed for wheezing or shortness of breath.   Yes [provider]  ascorbic acid (VITAMIN C) 500 MG tablet Take 500 mg by mouth 2 (two) times daily.   Yes [provider]  benzocaine-menthol (CHLORASEPTIC) 6-10 MG lozenge Take 1 lozenge by mouth every 4 (four) hours as needed for sore throat.   Yes [provider]  cholecalciferol (VITAMIN D) 25 MCG (1000 UNIT) tablet Take 1,000 Units by mouth daily.   Yes [provider]  Cholecalciferol (VITAMIN D) 50 MCG (2000 UT) CAPS Take 2,000 Units by mouth daily.    Yes [provider]  Cranberry 250 MG CAPS Take 1,000 mg by mouth 2 (two) times daily.    Yes [provider]  diphenhydrAMINE (BENADRYL) 25 mg capsule Take 25 mg by mouth every 6 (six) hours as needed for itching.   Yes [provider]  Ergocalciferol (VITAMIN D2) 50 MCG (2000 UT) TABS Take 2,000 Units by mouth daily.   Yes [provider]  guaiFENesin (MUCINEX) 600 MG 12 hr tablet Take 600 mg by mouth 2 (two) times  daily.   Yes [provider]  Infant Care Products (DERMACLOUD) CREA Apply 1 application topically 2 (two) times daily as needed (irritation). Apply to buttocks as needed for protection   Yes [provider]  iron polysaccharides (NIFEREX) 150 MG capsule Take 150 mg by mouth 2 (two) times daily.    Yes [provider]  LACTOBACILLUS PO Take 1 capsule by mouth 2 (two) times daily.    Yes [provider]  loperamide (IMODIUM) 2 MG capsule Take 2 mg by mouth every 4 (four) hours as needed for diarrhea or loose stools. Max  8 capsules per 24 hours   Yes [provider]  loratadine (CLARITIN REDITABS) 10 MG dissolvable tablet Take 10 mg by mouth daily.   Yes [provider]  Melatonin 3 MG TABS Take 3 mg by mouth at bedtime.   Yes [provider]  methocarbamol (ROBAXIN) 500 MG tablet Take 500 mg by mouth every 8 (eight) hours as needed for muscle spasms.   Yes [provider]  pregabalin (LYRICA) 150 MG capsule Take 150 mg by mouth in the morning, at noon, and at bedtime.   Yes [provider]  saccharomyces boulardii (FLORASTOR) 250 MG capsule Take 250 mg by mouth 2 (two) times daily.   Yes [provider]  sodium chloride (OCEAN) 0.65 % SOLN nasal spray Place 1 spray into both nostrils every 2 (two) hours as needed (nasal dryness).   Yes [provider]  traMADol (ULTRAM) 50 MG tablet Take 1 tablet (50 mg total) by mouth every 4 (four) hours as needed for severe pain. 01/26/17  Yes Love, Ivan Anchors, PA-C  vancomycin (VANCOCIN) 125 MG capsule Take 125 mg by mouth every 6 (six) hours. 10 day course started, 09/27/19 - 10/07/19. 09/26/19  Yes [provider]  Zinc Gluconate 100 MG TABS Take 100 mg by mouth daily.   Yes [provider]    Blood pressure 126/65, pulse (!) 106, temperature 98.4 F (36.9 C), temperature source Oral, resp. rate (!) 25, height 5' (1.524 m), weight 127 kg, SpO2 100 %. Physical Exam: General: pleasant, overweight AA female who is laying in bed in NAD HEENT: head is normocephalic, atraumatic.  Sclera are noninjected.  Pupils equal and round.  Ears and nose without any masses or lesions.  Mouth is pink and moist. Dentition fair Lungs: no audible wheezing.  Respiratory effort nonlabored Abd: obese, soft, NT/ND, no masses, hernias, or organomegaly MS: mild edema BUE/BLE, calves soft and nontender Skin: warm and dry with no masses, lesions, or rashes Psych: A&Ox4 with an appropriate affect Neuro: paraplegic.  GU: 2  openings noted over sacrum that connect under the skin, no pus, no cellulitis. Small piece of necrotic tissue debrided from proximal wound with scissors      Results for orders placed or performed during the hospital encounter of 09/28/19 (from the past 48 hour(s))  SARS CORONAVIRUS 2 (TAT 6-24 HRS) Nasopharyngeal Nasopharyngeal Swab     Status: None   Collection Time: 09/28/19  1:05 AM   Specimen: Nasopharyngeal Swab  Result Value Ref Range   SARS Coronavirus 2 NEGATIVE NEGATIVE    Comment: (NOTE) SARS-CoV-2 target nucleic acids are NOT DETECTED. The SARS-CoV-2 RNA is generally detectable in upper and lower respiratory specimens during the acute phase of infection. Negative results do not preclude SARS-CoV-2 infection, do not rule out co-infections with other pathogens, and should not be used as the sole basis for treatment or other patient management  decisions. Negative results must be combined with clinical observations, patient history, and epidemiological information. The expected result is Negative. Fact Sheet for Patients: SugarRoll.be Fact Sheet for Healthcare Providers: https://www.woods-mathews.com/ This test is not yet approved or cleared by the [place] FDA and  has been authorized for detection and/or diagnosis of SARS-CoV-2 by FDA under an Emergency Use Authorization (EUA). This EUA will remain  in effect (meaning this test can be used) for the duration of the COVID-19 declaration under Section 56 4(b)(1) of the Act, 21 U.S.C. section 360bbb-3(b)(1), unless the authorization is terminated or revoked sooner. Performed at [facility], [place] [address], [place], [place] [address]   Lactic acid, plasma     Status: None   Collection Time: 09/28/19  8:26 PM  Result Value Ref Range   Lactic Acid, Venous 1.6 0.5 - 1.9 mmol/L    Comment: Performed at [facility] [address] [place], [place] [address]   Comprehensive metabolic panel     Status: Abnormal   Collection Time: 09/28/19  8:26 PM  Result Value Ref Range   Sodium 133 (L) 135 - 145 mmol/L   Potassium 3.8 3.5 - 5.1 mmol/L   Chloride 106 98 - 111 mmol/L   CO2 15 (L) 22 - 32 mmol/L   Glucose, Bld 154 (H) 70 - 99 mg/dL   BUN 41 (H) 6 - 20 mg/dL   Creatinine, Ser 3.02 (H) 0.44 - 1.00 mg/dL   Calcium 8.3 (L) 8.9 - 10.3 mg/dL   Total Protein 6.1 (L) 6.5 - 8.1 g/dL   Albumin 2.0 (L) 3.5 - 5.0 g/dL   AST 19 15 - 41 U/L   ALT 14 0 - 44 U/L   Alkaline Phosphatase 63 38 - 126 U/L   Total Bilirubin 0.6 0.3 - 1.2 mg/dL   GFR calc non Af Amer 18 (L) >60 mL/min   GFR calc Af Amer 21 (L) >60 mL/min   Anion gap 12 5 - 15    Comment: Performed at [facility], [place] [address], [place], [place] [address]  CBC WITH DIFFERENTIAL     Status: Abnormal   Collection Time: 09/28/19  8:26 PM  Result Value Ref Range   WBC 15.8 (H) 4.0 - 10.5 K/uL   RBC 3.81 (L) 3.87 - 5.11 MIL/uL   Hemoglobin 8.7 (L) 12.0 - 15.0 g/dL    Comment: Reticulocyte Hemoglobin testing may be clinically indicated, consider ordering this additional test [id number]    HCT 28.2 (L) 36.0 - 46.0 %   MCV 74.0 (L) 80.0 - 100.0 fL   MCH 22.8 (L) 26.0 - 34.0 pg   MCHC 30.9 30.0 - 36.0 g/dL   RDW 19.1 (H) 11.5 - 15.5 %   Platelets 487 (H) 150 - 400 K/uL   nRBC 0.2 0.0 - 0.2 %   Neutrophils Relative % 57 %   Neutro Abs 9.3 (H) 1.7 - 7.7 K/uL   Lymphocytes Relative 20 %   Lymphs Abs 3.1 0.7 - 4.0 K/uL   Monocytes Relative 7 %   Monocytes Absolute 1.1 (H) 0.1 - 1.0 K/uL   Eosinophils Relative 7 %   Eosinophils Absolute 1.1 (H) 0.0 - 0.5 K/uL   Basophils Relative 1 %   Basophils Absolute 0.1 0.0 - 0.1 K/uL   Immature Granulocytes 8 %   Abs Immature Granulocytes 1.19 (H) 0.00 - 0.07 K/uL    Comment: Performed at [facility], [address],  [place] [address]  APTT     Status: None   Collection Time: 09/28/19  8:26 PM  Result Value Ref Range   aPTT 34  24 - 36 seconds    Comment: Performed at [facility] [address] [place], [place] [address]  Protime-INR     Status: Abnormal   Collection Time: 09/28/19  8:26 PM  Result Value Ref Range   Prothrombin Time 15.6 (H) 11.4 - 15.2 seconds   INR 1.2 0.8 - 1.2    Comment: (NOTE) INR goal varies based on device and disease states. Performed at [facility], [place] [address], [place], [place] [address]   Blood Culture (routine x 2)     Status: None (Preliminary result)   Collection Time: 09/28/19  8:26 PM   Specimen: BLOOD  Result Value Ref Range   Specimen Description BLOOD RIGHT ARM    Special Requests      BOTTLES DRAWN AEROBIC ONLY Blood Culture results may not be optimal due to an inadequate volume of blood received in culture bottles   Culture      NO GROWTH < 12 HOURS Performed at [facility] [address] [place],  [address]    Report Status PENDING   I-Stat beta hCG blood, ED     Status: None   Collection Time: 09/28/19  8:43 PM  Result Value Ref Range   I-stat hCG, quantitative <5.0 <5 mIU/mL   Comment 3            Comment:   GEST. AGE      CONC.  (mIU/mL)   <=1 WEEK        5 - 50     2 WEEKS       50 - 500     3 WEEKS       100 - 10,000     4 WEEKS     1,000 - 30,000        FEMALE AND NON-PREGNANT FEMALE:     LESS THAN 5 mIU/mL   I-stat chem 8, ED (not at [facility])     Status: Abnormal   Collection Time: 09/28/19  8:50 PM  Result Value Ref Range   Sodium 131 (L) 135 - 145 mmol/L   Potassium 3.8 3.5 - 5.1 mmol/L   Chloride 105 98 - 111 mmol/L   BUN 41 (H) 6 - 20 mg/dL   Creatinine, Ser 3.10 (H) 0.44 - 1.00 mg/dL   Glucose, Bld 150 (H) 70 - 99 mg/dL   Calcium, Ion 1.14 (L) 1.15 - 1.40 mmol/L   TCO2 19 (L) 22 - 32 mmol/L   Hemoglobin 9.2 (L) 12.0 - 15.0 g/dL   HCT 27.0 (L) 36.0 - 46.0 %  Urinalysis, Routine w reflex microscopic     Status: Abnormal   Collection Time: 09/28/19  9:18 PM  Result Value Ref Range   Color, Urine YELLOW  YELLOW   APPearance CLOUDY (A) CLEAR   Specific Gravity, Urine 1.008 1.005 - 1.030   pH 8.0 5.0 - 8.0   Glucose, UA NEGATIVE NEGATIVE mg/dL   Hgb urine dipstick NEGATIVE NEGATIVE   Bilirubin Urine NEGATIVE NEGATIVE   Ketones, ur NEGATIVE NEGATIVE mg/dL   Protein, ur 100 (A) NEGATIVE mg/dL   Nitrite NEGATIVE NEGATIVE   Leukocytes,Ua MODERATE (A) NEGATIVE   RBC / HPF 0-5 0 - 5 RBC/hpf   WBC, UA 21-50 0 - 5 WBC/hpf   Bacteria, UA MANY (  A) NONE SEEN   Squamous Epithelial / LPF 0-5 0 - 5   Amorphous Crystal PRESENT     Comment: Performed at [facility], [place] [address] [place], [place] [address]  Blood Culture (routine x 2)     Status: None (Preliminary result)   Collection Time: 09/28/19  9:45 PM   Specimen: BLOOD  Result Value Ref Range   Specimen Description BLOOD LEFT ANTECUBITAL    Special Requests      BOTTLES DRAWN AEROBIC AND ANAEROBIC Blood Culture results may not be optimal due to an inadequate volume of blood received in culture bottles   Culture      NO GROWTH < 12 HOURS Performed at [facility] [address] [place], [place] [address]    Report Status PENDING   Respiratory Panel by RT PCR (Flu A&B, Covid) - Nasopharyngeal Swab     Status: None   Collection Time: 09/29/19  1:29 AM   Specimen: Nasopharyngeal Swab  Result Value Ref Range   SARS Coronavirus 2 by RT PCR NEGATIVE NEGATIVE    Comment: (NOTE) SARS-CoV-2 target nucleic acids are NOT DETECTED. The SARS-CoV-2 RNA is generally detectable in upper respiratoy specimens during the acute phase of infection. The lowest concentration of SARS-CoV-2 viral copies this assay can detect is 131 copies/mL. A negative result does not preclude SARS-Cov-2 infection and should not be used as the sole basis for treatment or other patient management decisions. A negative result may occur with  improper specimen collection/handling, submission of specimen other than nasopharyngeal swab, presence of viral  mutation(s) within the areas targeted by this assay, and inadequate number of viral copies (<131 copies/mL). A negative result must be combined with clinical observations, patient history, and epidemiological information. The expected result is Negative. Fact Sheet for Patients:  PinkCheek.be Fact Sheet for Healthcare Providers:  GravelBags.it This test is not yet ap proved or cleared by the [place] FDA and  has been authorized for detection and/or diagnosis of SARS-CoV-2 by FDA under an Emergency Use Authorization (EUA). This EUA will remain  in effect (meaning this test can be used) for the duration of the COVID-19 declaration under Section 564(b)(1) of the Act, 21 U.S.C. section 360bbb-3(b)(1), unless the authorization is terminated or revoked sooner.    Influenza A by PCR NEGATIVE NEGATIVE   Influenza B by PCR NEGATIVE NEGATIVE    Comment: (NOTE) The Xpert Xpress SARS-CoV-2/FLU/RSV assay is intended as an aid in  the diagnosis of influenza from Nasopharyngeal swab specimens and  should not be used as a sole basis for treatment. Nasal washings and  aspirates are unacceptable for Xpert Xpress SARS-CoV-2/FLU/RSV  testing. Fact Sheet for Patients: PinkCheek.be Fact Sheet for Healthcare Providers: GravelBags.it This test is not yet approved or cleared by the [place] FDA and  has been authorized for detection and/or diagnosis of SARS-CoV-2 by  FDA under an Emergency Use Authorization (EUA). This EUA will remain  in effect (meaning this test can be used) for the duration of the  Covid-19 declaration under Section 564(b)(1) of the Act, 21  U.S.C. section 360bbb-3(b)(1), unless the authorization is  terminated or revoked. Performed at [facility], [place] [address] [place], [place] [address]   Cortisol     Status: None   Collection Time: 09/29/19  3:54  AM  Result Value Ref Range   Cortisol, Plasma 17.8 ug/dL    Comment: (NOTE) AM    6.7 - 22.6 ug/dL PM   <  10.0       ug/dL Performed at [facility] [address] [place], [place] [address]   Comprehensive metabolic panel     Status: Abnormal   Collection Time: 09/29/19  3:56 AM  Result Value Ref Range   Sodium 136 135 - 145 mmol/L   Potassium 3.8 3.5 - 5.1 mmol/L   Chloride 111 98 - 111 mmol/L   CO2 13 (L) 22 - 32 mmol/L   Glucose, Bld 145 (H) 70 - 99 mg/dL   BUN 42 (H) 6 - 20 mg/dL   Creatinine, Ser 2.86 (H) 0.44 - 1.00 mg/dL   Calcium 7.9 (L) 8.9 - 10.3 mg/dL   Total Protein 5.8 (L) 6.5 - 8.1 g/dL   Albumin 1.8 (L) 3.5 - 5.0 g/dL   AST 10 (L) 15 - 41 U/L   ALT 11 0 - 44 U/L   Alkaline Phosphatase 61 38 - 126 U/L   Total Bilirubin 0.7 0.3 - 1.2 mg/dL   GFR calc non Af Amer 19 (L) >60 mL/min   GFR calc Af Amer 22 (L) >60 mL/min   Anion gap 12 5 - 15    Comment: Performed at [facility], [place] [address] [place], [place] [address]  CBC     Status: Abnormal   Collection Time: 09/29/19  3:56 AM  Result Value Ref Range   WBC 17.4 (H) 4.0 - 10.5 K/uL   RBC 3.49 (L) 3.87 - 5.11 MIL/uL   Hemoglobin 8.0 (L) 12.0 - 15.0 g/dL    Comment: Reticulocyte Hemoglobin testing may be clinically indicated, consider ordering this additional test [id number]    HCT 25.8 (L) 36.0 - 46.0 %   MCV 73.9 (L) 80.0 - 100.0 fL   MCH 22.9 (L) 26.0 - 34.0 pg   MCHC 31.0 30.0 - 36.0 g/dL   RDW 19.5 (H) 11.5 - 15.5 %   Platelets 474 (H) 150 - 400 K/uL   nRBC 0.2 0.0 - 0.2 %    Comment: Performed at [facility], [place] [address], [place], [place] [address]  Troponin I (High Sensitivity)     Status: None   Collection Time: 09/29/19  3:56 AM  Result Value Ref Range   Troponin I (High Sensitivity) 17 <18 ng/L    Comment: (NOTE) Elevated high sensitivity troponin I (hsTnI) values and significant  changes across serial measurements may suggest ACS but many other  chronic and acute  conditions are known to elevate hsTnI results.  Refer to the "Links" section for chest pain algorithms and additional  guidance. Performed at [facility], [place] [address] [place], [place] [address]   MRSA PCR Screening     Status: None   Collection Time: 09/29/19  4:47 AM   Specimen: Nasal Mucosa; Nasopharyngeal  Result Value Ref Range   MRSA by PCR NEGATIVE NEGATIVE    Comment:        The GeneXpert MRSA Assay (FDA approved for NASAL specimens only), is one component of a comprehensive MRSA colonization surveillance program. It is not intended to diagnose MRSA infection nor to guide or monitor treatment for MRSA infections. Performed at [facility], [place] [address] [place], [place] [address]    CT ABDOMEN PELVIS WO CONTRAST  Result Date: 09/28/2019 CLINICAL DATA:  Constipation, fecal impaction, currently being treated for C difficile colitis. EXAM: CT ABDOMEN AND PELVIS WITHOUT CONTRAST TECHNIQUE: Multidetector CT imaging of the abdomen and pelvis was performed following the  standard protocol without IV contrast. COMPARISON:  03/20/2019 FINDINGS: Lower chest: No acute pleural or parenchymal lung disease. Hepatobiliary: No focal liver abnormality is seen. No gallstones, gallbladder wall thickening, or biliary dilatation. Pancreas: Unremarkable. No pancreatic ductal dilatation or surrounding inflammatory changes. Spleen: Normal in size without focal abnormality. Adrenals/Urinary Tract: Adrenals are unremarkable. There is bilateral renal cortical atrophy. Faint 3-4 mm nonobstructing calculi are seen within the right kidney. There is mild right-sided hydronephrosis and hydroureter, though I do not see a clear obstructing calculus or mass. Overall this is improved since previous study. On the left, there is minimal distension of the left renal pelvis and proximal left ureter. No evidence of left-sided calculi. The bladder is decompressed with a Foley catheter. Stomach/Bowel: There  is moderate residual stool within the colon, though improved since prior study. Segmental areas of wall thickening are seen involving the cecum and sigmoid colon. Findings are consistent with multifocal colitis. No bowel obstruction. Vascular/Lymphatic: Multiple subcentimeter lymph nodes are seen within the retroperitoneum and surrounding the sigmoid colon, likely inflammatory. Vascular structures are grossly unremarkable. Reproductive: Uterus and bilateral adnexa are unremarkable. Other: There is prominent pericolonic fat stranding involving the rectosigmoid colon. No free fluid within the abdomen or pelvis. No free gas. Musculoskeletal: No acute or destructive bony lesions. Reconstructed images demonstrate no additional findings. IMPRESSION: 1. Segmental areas of colonic wall thickening consistent with colitis. By report, the patient is currently being treated for C difficile colitis. 2. Moderate fecal retention, though decreased stool since prior study. 3. Nonobstructing right renal calculi. There is dilatation of the bilateral renal pelves and ureters, right greater than left, though less pronounced than on prior study. This may be related underlying neurogenic bladder. 4. Pericolonic fat stranding surrounding the sigmoid colon, with multiple small lymph nodes, likely reactive. Electronically Signed   By: Randa Ngo M.D.   On: 09/28/2019 22:21   DG Chest Port 1 View  Result Date: 09/28/2019 CLINICAL DATA:  Sepsis EXAM: PORTABLE CHEST 1 VIEW COMPARISON:  10/13/2016 FINDINGS: The heart size and mediastinal contours are within normal limits. Both lungs are clear. The visualized skeletal structures are unremarkable. IMPRESSION: No active disease. Electronically Signed   By: Ulyses Jarred M.D.   On: 09/28/2019 21:04      Assessment/Plan Paraplegic 2/2 transverse myelitis  Neurogenic bladder DM-2 Morbid obesity Recent c diff colitis UTI  Sacral wounds  - Patient seen and examined with Dr.  Donne Hazel. Small amount of tissue debrided from wound at bedside. Would not recommend any surgical intervention. Pack wounds twice daily with saline dampened gauze, and as needed for saturation. Needs flexiseal for copious loose stool to keep this from going into the wound. Air mattress ordered. General surgery will sign off, please call with concerns.  Wellington Hampshire, [facility] 09/29/2019, 10:56 AM Please see Amion for pager number during day hours 7:00am-4:30pm

## 2019-09-29 NOTE — ED Notes (Signed)
Dr Donne Hazel into see pt

## 2019-09-29 NOTE — ED Notes (Signed)
Report given to Danielle RN

## 2019-09-29 NOTE — H&P (Signed)
TRH H&P    Patient Demographics:    Traci [name], is a 44 y.o. female  MRN: [id number]  DOB - [birth year]  Admit Date - 09/28/2019  Referring MD/NP/PA:  Morton Amy  Outpatient Primary MD for the patient is Seward Carol, MD  Patient coming from: [facility]  Chief complaint- fecal impaction   HPI:    Traci [name]  is a 44 y.o. female,  w Dm2, Transverse myelitis/ paraplegia, neurogenic bladder, recent diagnosis of C. Diff colitis on oral vancomycin since 09/26/2019, apparently presents with c/o bilateral lower abdominal discomfort and xray showed fecal impaction and thus sent to ED for evaluation.   In ED,  T 102  P 118, R 30, Bp 77/66 pox 99% on RA Wt 117.9kg  CT abd/ pelvis IMPRESSION: 1. Segmental areas of colonic wall thickening consistent with colitis. By report, the patient is currently being treated for C difficile colitis.  2. Moderate fecal retention, though decreased stool since prior study. 3. Nonobstructing right renal calculi. There is dilatation of the bilateral renal pelves and ureters, right greater than left, though less pronounced than on prior study. This may be related underlying neurogenic bladder. 4. Pericolonic fat stranding surrounding the sigmoid colon, with multiple small lymph nodes, likely reactive.   CXR IMPRESSION: No active disease.  Na 133, K 3.8,  Bun 41, creatinine 3.02 (baseline 2) Alb 2.0 Ast 19, Alt 14 Wbc 15.8, Hgb 8.7 Plt 487 mcv 74, rdw 19.1 INR 1.2 Blood culture x2 pending Urinalysis wbc 21-50, rbc 0-5  Pt given vanco iv, cefepime iv, and flagyl iv x1 in ED.  Pt give 3L ns iv x1 in ED  Pt will be admitted for sepsis secondary to Acute lower UTI vs C. Diff, as well as ARF on CRF.       Review of systems:    In addition to the HPI above,  No Fever-chills, No Headache, No changes with Vision or hearing, No problems swallowing food  or Liquids, No Chest pain, Cough or Shortness of Breath, No Abdominal pain, No Nausea or Vomiting, bowel movements are regular, No Blood in stool or Urine, No dysuria, No new skin rashes or bruises, No new joints pains-aches,  No new weakness, tingling, numbness in any extremity, No recent weight gain or loss, No polyuria, polydypsia or polyphagia, No significant Mental Stressors.  All other systems reviewed and are negative.    Past History of the following :    Past Medical History:  Diagnosis Date  . C. difficile colitis 09/24/2019  . H/O degenerative disc disease   . Obesity   . Paraplegia ([place])   . Transverse myelitis [facility])       Past Surgical History:  Procedure Laterality Date  . fibroid tumor    . KNEE ARTHROSCOPY    . TUBAL LIGATION        Social History:      Social History   Tobacco Use  . Smoking status: Never Smoker  . Smokeless tobacco: Never Used  Substance Use Topics  . Alcohol use:  No       Family History :     Family History  Problem Relation Age of Onset  . Diabetes Mother   . Diabetes Father        Home Medications:   Prior to Admission medications   Medication Sig Start Date End Date Taking? Authorizing Provider  acetaminophen (TYLENOL) 500 MG tablet Take 500 mg by mouth every 4 (four) hours as needed.    [provider]  cholecalciferol (VITAMIN D) 1000 units tablet Take 2,000 Units by mouth daily.    [provider]  Cranberry 250 MG CAPS Take 250 mg by mouth 2 (two) times daily.    [provider]  dexamethasone (DECADRON) 4 MG tablet Take 12 mg by mouth every other day. 09/04/19   [provider]  diphenhydrAMINE (BENADRYL) 25 mg capsule Take 25 mg by mouth every 6 (six) hours as needed for itching.    [provider]  famotidine (PEPCID) 20 MG tablet Take 20 mg by mouth 2 (two) times daily.    [provider]  gabapentin (NEURONTIN) 300 MG capsule Take 1 capsule (300 mg  total) by mouth 3 (three) times daily. 05/04/17   Pieter Partridge, DO  iron polysaccharides (NIFEREX) 150 MG capsule Take 150 mg by mouth daily.    [provider]  LACTOBACILLUS PO Take by mouth 2 (two) times daily.    [provider]  Loratadine 10 MG CAPS Take 10 mg by mouth daily.    [provider]  meloxicam (MOBIC) 15 MG tablet Take 1 tablet (15 mg total) by mouth daily. 01/27/17   Love, Ivan Anchors, PA-C  methocarbamol (ROBAXIN) 500 MG tablet Take 1 tablet (500 mg total) by mouth every 6 (six) hours as needed for muscle spasms. 01/26/17   Love, Ivan Anchors, PA-C  polyethylene glycol (MIRALAX / GLYCOLAX) packet Take 17 g by mouth daily. Adjust to twice a day once antibiotics discontinued. 01/27/17   Love, Ivan Anchors, PA-C  rivaroxaban (XARELTO) 20 MG TABS tablet Take 1 tablet (20 mg total) by mouth daily with supper. 01/26/17   Love, Ivan Anchors, PA-C  senna-docusate (SENOKOT-S) 8.6-50 MG tablet Take 2 tablets by mouth at bedtime. Increase to twice a day once antibiotics discontinued. 01/26/17   Love, Ivan Anchors, PA-C  sodium phosphate (FLEET) 7-19 GM/118ML ENEM Place 133 mLs (1 enema total) rectally daily at 6 (six) AM. Patient not taking: Reported on 05/04/2017 01/27/17   Love, Ivan Anchors, PA-C  traMADol (ULTRAM) 50 MG tablet Take 1 tablet (50 mg total) by mouth every 4 (four) hours as needed for severe pain. 01/26/17   Love, Ivan Anchors, PA-C  vancomycin (VANCOCIN) 125 MG capsule Take 125 mg by mouth 4 (four) times daily. 09/26/19   [provider]  pregabalin (LYRICA) 100 MG capsule Take 1 capsule (100 mg total) by mouth 3 (three) times daily. 09/03/17 03/29/19  Pieter Partridge, DO     Allergies:    No Known Allergies   Physical Exam:   Vitals  Blood pressure 122/69, pulse (!) 114, temperature (!) 101.1 F (38.4 C), temperature source Oral, resp. rate (!) 26, height 5' (1.524 m), weight 127 kg, SpO2 94 %.  1.  General: axoxo3  2. Psychiatric: euthymic  3.  Neurologic: paraplegic  4. HEENMT:  Anicteric, pupils 1.44mm symmetric, direct, consensual intact Neck: no jvd  5. Respiratory : CTAB  6. Cardiovascular : rrr s1, s2, no m/g/r  7. Gastrointestinal:  Abd: soft,  nt, nd, +bs  8. Skin:  Ext: no c/c/e.  No rash  9.Musculoskeletal:  Good ROM    Data Review:    CBC Recent Labs  Lab 09/28/19 2026 09/28/19 2050  WBC 15.8*  --   HGB 8.7* 9.2*  HCT 28.2* 27.0*  PLT 487*  --   MCV 74.0*  --   MCH 22.8*  --   MCHC 30.9  --   RDW 19.1*  --   LYMPHSABS 3.1  --   MONOABS 1.1*  --   EOSABS 1.1*  --   BASOSABS 0.1  --    ------------------------------------------------------------------------------------------------------------------  Results for orders placed or performed during the hospital encounter of 09/28/19 (from the past 48 hour(s))  Lactic acid, plasma     Status: None   Collection Time: 09/28/19  8:26 PM  Result Value Ref Range   Lactic Acid, Venous 1.6 0.5 - 1.9 mmol/L    Comment: Performed at [facility], [place] [address] [place], [place] [address]  Comprehensive metabolic panel     Status: Abnormal   Collection Time: 09/28/19  8:26 PM  Result Value Ref Range   Sodium 133 (L) 135 - 145 mmol/L   Potassium 3.8 3.5 - 5.1 mmol/L   Chloride 106 98 - 111 mmol/L   CO2 15 (L) 22 - 32 mmol/L   Glucose, Bld 154 (H) 70 - 99 mg/dL   BUN 41 (H) 6 - 20 mg/dL   Creatinine, Ser 3.02 (H) 0.44 - 1.00 mg/dL   Calcium 8.3 (L) 8.9 - 10.3 mg/dL   Total Protein 6.1 (L) 6.5 - 8.1 g/dL   Albumin 2.0 (L) 3.5 - 5.0 g/dL   AST 19 15 - 41 U/L   ALT 14 0 - 44 U/L   Alkaline Phosphatase 63 38 - 126 U/L   Total Bilirubin 0.6 0.3 - 1.2 mg/dL   GFR calc non Af Amer 18 (L) >60 mL/min   GFR calc Af Amer 21 (L) >60 mL/min   Anion gap 12 5 - 15    Comment: Performed at [facility], [place] [address] [place], [place] [address]  CBC WITH DIFFERENTIAL     Status: Abnormal   Collection Time: 09/28/19  8:26 PM  Result Value Ref  Range   WBC 15.8 (H) 4.0 - 10.5 K/uL   RBC 3.81 (L) 3.87 - 5.11 MIL/uL   Hemoglobin 8.7 (L) 12.0 - 15.0 g/dL    Comment: Reticulocyte Hemoglobin testing may be clinically indicated, consider ordering this additional test [id number]    HCT 28.2 (L) 36.0 - 46.0 %   MCV 74.0 (L) 80.0 - 100.0 fL   MCH 22.8 (L) 26.0 - 34.0 pg   MCHC 30.9 30.0 - 36.0 g/dL   RDW 19.1 (H) 11.5 - 15.5 %   Platelets 487 (H) 150 - 400 K/uL   nRBC 0.2 0.0 - 0.2 %   Neutrophils Relative % 57 %   Neutro Abs 9.3 (H) 1.7 - 7.7 K/uL   Lymphocytes Relative 20 %   Lymphs Abs 3.1 0.7 - 4.0 K/uL   Monocytes Relative 7 %   Monocytes Absolute 1.1 (H) 0.1 - 1.0 K/uL   Eosinophils Relative 7 %   Eosinophils Absolute 1.1 (H) 0.0 - 0.5 K/uL   Basophils Relative 1 %   Basophils Absolute 0.1 0.0 - 0.1 K/uL   Immature Granulocytes 8 %   Abs Immature Granulocytes 1.19 (H) 0.00 - 0.07 K/uL    Comment: Performed at  [facility], [place] [address] [place], [place] [address]  APTT     Status: None   Collection Time: 09/28/19  8:26 PM  Result Value Ref Range   aPTT 34 24 - 36 seconds    Comment: Performed at [facility] [address] [place], [place] [address]  Protime-INR     Status: Abnormal   Collection Time: 09/28/19  8:26 PM  Result Value Ref Range   Prothrombin Time 15.6 (H) 11.4 - 15.2 seconds   INR 1.2 0.8 - 1.2    Comment: (NOTE) INR goal varies based on device and disease states. Performed at [facility], [place] [address], [place], [place] [address]   I-Stat beta hCG blood, ED     Status: None   Collection Time: 09/28/19  8:43 PM  Result Value Ref Range   I-stat hCG, quantitative <5.0 <5 mIU/mL   Comment 3            Comment:   GEST. AGE      CONC.  (mIU/mL)   <=1 WEEK        5 - 50     2 WEEKS       50 - 500     3 WEEKS       100 - 10,000     4 WEEKS     1,000 - 30,000        FEMALE AND NON-PREGNANT FEMALE:     LESS THAN 5 mIU/mL   I-stat chem 8, ED (not at [facility] or [facility])     Status:  Abnormal   Collection Time: 09/28/19  8:50 PM  Result Value Ref Range   Sodium 131 (L) 135 - 145 mmol/L   Potassium 3.8 3.5 - 5.1 mmol/L   Chloride 105 98 - 111 mmol/L   BUN 41 (H) 6 - 20 mg/dL   Creatinine, Ser 3.10 (H) 0.44 - 1.00 mg/dL   Glucose, Bld 150 (H) 70 - 99 mg/dL   Calcium, Ion 1.14 (L) 1.15 - 1.40 mmol/L   TCO2 19 (L) 22 - 32 mmol/L   Hemoglobin 9.2 (L) 12.0 - 15.0 g/dL   HCT 27.0 (L) 36.0 - 46.0 %  Urinalysis, Routine w reflex microscopic     Status: Abnormal   Collection Time: 09/28/19  9:18 PM  Result Value Ref Range   Color, Urine YELLOW YELLOW   APPearance CLOUDY (A) CLEAR   Specific Gravity, Urine 1.008 1.005 - 1.030   pH 8.0 5.0 - 8.0   Glucose, UA NEGATIVE NEGATIVE mg/dL   Hgb urine dipstick NEGATIVE NEGATIVE   Bilirubin Urine NEGATIVE NEGATIVE   Ketones, ur NEGATIVE NEGATIVE mg/dL   Protein, ur 100 (A) NEGATIVE mg/dL   Nitrite NEGATIVE NEGATIVE   Leukocytes,Ua MODERATE (A) NEGATIVE   RBC / HPF 0-5 0 - 5 RBC/hpf   WBC, UA 21-50 0 - 5 WBC/hpf   Bacteria, UA MANY (A) NONE SEEN   Squamous Epithelial / LPF 0-5 0 - 5   Amorphous Crystal PRESENT     Comment: Performed at [facility], [address] [place], [place] [address]    Chemistries  Recent Labs  Lab 09/28/19 2026 09/28/19 2050  NA 133* 131*  K 3.8 3.8  CL 106 105  CO2 15*  --   GLUCOSE 154* 150*  BUN 41* 41*  CREATININE 3.02* 3.10*  CALCIUM 8.3*  --   AST 19  --   ALT 14  --  ALKPHOS 63  --   BILITOT 0.6  --    ------------------------------------------------------------------------------------------------------------------  ------------------------------------------------------------------------------------------------------------------ GFR: Estimated Creatinine Clearance: 28.3 mL/min (A) (by C-G formula based on SCr of 3.1 mg/dL (H)). Liver Function Tests: Recent Labs  Lab 09/28/19 2026  AST 19  ALT 14  ALKPHOS 63  BILITOT 0.6  PROT 6.1*  ALBUMIN 2.0*   No results  for input(s): LIPASE, AMYLASE in the last 168 hours. No results for input(s): AMMONIA in the last 168 hours. Coagulation Profile: Recent Labs  Lab 09/28/19 2026  INR 1.2   Cardiac Enzymes: No results for input(s): CKTOTAL, CKMB, CKMBINDEX, TROPONINI in the last 168 hours. BNP (last 3 results) No results for input(s): PROBNP in the last 8760 hours. HbA1C: No results for input(s): HGBA1C in the last 72 hours. CBG: No results for input(s): GLUCAP in the last 168 hours. Lipid Profile: No results for input(s): CHOL, HDL, LDLCALC, TRIG, CHOLHDL, LDLDIRECT in the last 72 hours. Thyroid Function Tests: No results for input(s): TSH, T4TOTAL, FREET4, T3FREE, THYROIDAB in the last 72 hours. Anemia Panel: No results for input(s): VITAMINB12, FOLATE, FERRITIN, TIBC, IRON, RETICCTPCT in the last 72 hours.  --------------------------------------------------------------------------------------------------------------- Urine analysis:    Component Value Date/Time   COLORURINE YELLOW 09/28/2019 2118   APPEARANCEUR CLOUDY (A) 09/28/2019 2118   LABSPEC 1.008 09/28/2019 2118   PHURINE 8.0 09/28/2019 2118   GLUCOSEU NEGATIVE 09/28/2019 2118   HGBUR NEGATIVE 09/28/2019 2118   BILIRUBINUR NEGATIVE 09/28/2019 2118   [place] NEGATIVE 09/28/2019 2118   PROTEINUR 100 (A) 09/28/2019 2118   NITRITE NEGATIVE 09/28/2019 2118   LEUKOCYTESUR MODERATE (A) 09/28/2019 2118      Imaging Results:    CT ABDOMEN PELVIS WO CONTRAST  Result Date: 09/28/2019 CLINICAL DATA:  Constipation, fecal impaction, currently being treated for C difficile colitis. EXAM: CT ABDOMEN AND PELVIS WITHOUT CONTRAST TECHNIQUE: Multidetector CT imaging of the abdomen and pelvis was performed following the standard protocol without IV contrast. COMPARISON:  03/20/2019 FINDINGS: Lower chest: No acute pleural or parenchymal lung disease. Hepatobiliary: No focal liver abnormality is seen. No gallstones, gallbladder wall thickening, or  biliary dilatation. Pancreas: Unremarkable. No pancreatic ductal dilatation or surrounding inflammatory changes. Spleen: Normal in size without focal abnormality. Adrenals/Urinary Tract: Adrenals are unremarkable. There is bilateral renal cortical atrophy. Faint 3-4 mm nonobstructing calculi are seen within the right kidney. There is mild right-sided hydronephrosis and hydroureter, though I do not see a clear obstructing calculus or mass. Overall this is improved since previous study. On the left, there is minimal distension of the left renal pelvis and proximal left ureter. No evidence of left-sided calculi. The bladder is decompressed with a Foley catheter. Stomach/Bowel: There is moderate residual stool within the colon, though improved since prior study. Segmental areas of wall thickening are seen involving the cecum and sigmoid colon. Findings are consistent with multifocal colitis. No bowel obstruction. Vascular/Lymphatic: Multiple subcentimeter lymph nodes are seen within the retroperitoneum and surrounding the sigmoid colon, likely inflammatory. Vascular structures are grossly unremarkable. Reproductive: Uterus and bilateral adnexa are unremarkable. Other: There is prominent pericolonic fat stranding involving the rectosigmoid colon. No free fluid within the abdomen or pelvis. No free gas. Musculoskeletal: No acute or destructive bony lesions. Reconstructed images demonstrate no additional findings. IMPRESSION: 1. Segmental areas of colonic wall thickening consistent with colitis. By report, the patient is currently being treated for C difficile colitis. 2. Moderate fecal retention, though decreased stool since prior study. 3. Nonobstructing right renal calculi. There is dilatation of the  bilateral renal pelves and ureters, right greater than left, though less pronounced than on prior study. This may be related underlying neurogenic bladder. 4. Pericolonic fat stranding surrounding the sigmoid colon, with  multiple small lymph nodes, likely reactive. Electronically Signed   By: Randa Ngo M.D.   On: 09/28/2019 22:21   DG Chest Port 1 View  Result Date: 09/28/2019 CLINICAL DATA:  Sepsis EXAM: PORTABLE CHEST 1 VIEW COMPARISON:  10/13/2016 FINDINGS: The heart size and mediastinal contours are within normal limits. Both lungs are clear. The visualized skeletal structures are unremarkable. IMPRESSION: No active disease. Electronically Signed   By: Ulyses Jarred M.D.   On: 09/28/2019 21:04    ekg st at 115, nl axis, early R progression, no st-t changes c/w ischemia    Assessment & Plan:    Principal Problem:   Sepsis ([place]) Active Problems:   Transverse myelitis ([place])   Diabetes mellitus type 2 in obese ([place])   Acute lower UTI   C. difficile diarrhea   Sepsis (Fever, tachycardia, hypotension, leukocytosis) Blood culture x2 vanco iv, cefepime iv, Flagyl iv  Hypotension Check trop I Check cortisol Consider echo if persistent  Acute lower UTI Urine culture  Cefepime iv as above  C. Diff Contact precautions Flagyl 500mg  iv tid Vanco 125mg  po qid  ARF on CKD stage 4 Hydrate with ns iv Check cmp in am  Severe protein calorie malnutrition Start Prostat 30 mL po bid  Anemia, iron deficiency Check cbc in am  ? Dm2 fsbs ac and qhs, ISS  Skin ulcer, ? Decub Wound care consult    DVT Prophylaxis-   Lovenox - SCDs  AM Labs Ordered, also please review Full Orders  Family Communication: Admission, patients condition and plan of care including tests being ordered have been discussed with the patient who indicate understanding and agree with the plan and Code Status.  Code Status:  FULL CODE per patient  Admission status: Inpatient: Based on patients clinical presentation and evaluation of above clinical data, I have made determination that patient meets Inpatient criteria at this time.  Pt has sepsis,(hypotension) and acute lower uti and c. Diff, and will require iv abx, and  > 2 nites stay.   Time spent in minutes : 70 minutes   Jani Gravel M.D on 09/29/2019 at 12:58 AM

## 2019-09-30 LAB — CBC WITH DIFFERENTIAL/PLATELET
Abs Immature Granulocytes: 2.71 10*3/uL — ABNORMAL HIGH (ref 0.00–0.07)
Basophils Absolute: 0.1 10*3/uL (ref 0.0–0.1)
Basophils Relative: 0 %
Eosinophils Absolute: 1.1 10*3/uL — ABNORMAL HIGH (ref 0.0–0.5)
Eosinophils Relative: 6 %
HCT: 24.5 % — ABNORMAL LOW (ref 36.0–46.0)
Hemoglobin: 7.8 g/dL — ABNORMAL LOW (ref 12.0–15.0)
Immature Granulocytes: 14 %
Lymphocytes Relative: 22 %
Lymphs Abs: 4.2 10*3/uL — ABNORMAL HIGH (ref 0.7–4.0)
MCH: 23.1 pg — ABNORMAL LOW (ref 26.0–34.0)
MCHC: 31.8 g/dL (ref 30.0–36.0)
MCV: 72.5 fL — ABNORMAL LOW (ref 80.0–100.0)
Monocytes Absolute: 1.1 10*3/uL — ABNORMAL HIGH (ref 0.1–1.0)
Monocytes Relative: 6 %
Neutro Abs: 9.8 10*3/uL — ABNORMAL HIGH (ref 1.7–7.7)
Neutrophils Relative %: 52 %
Platelets: 467 10*3/uL — ABNORMAL HIGH (ref 150–400)
RBC: 3.38 MIL/uL — ABNORMAL LOW (ref 3.87–5.11)
RDW: 19.2 % — ABNORMAL HIGH (ref 11.5–15.5)
WBC: 19 10*3/uL — ABNORMAL HIGH (ref 4.0–10.5)
nRBC: 0.3 % — ABNORMAL HIGH (ref 0.0–0.2)

## 2019-09-30 LAB — BASIC METABOLIC PANEL
Anion gap: 11 (ref 5–15)
BUN: 45 mg/dL — ABNORMAL HIGH (ref 6–20)
CO2: 17 mmol/L — ABNORMAL LOW (ref 22–32)
Calcium: 7.9 mg/dL — ABNORMAL LOW (ref 8.9–10.3)
Chloride: 108 mmol/L (ref 98–111)
Creatinine, Ser: 2.61 mg/dL — ABNORMAL HIGH (ref 0.44–1.00)
GFR calc Af Amer: 25 mL/min — ABNORMAL LOW (ref >60)
GFR calc non Af Amer: 21 mL/min — ABNORMAL LOW (ref >60)
Glucose, Bld: 128 mg/dL — ABNORMAL HIGH (ref 70–99)
Potassium: 3.1 mmol/L — ABNORMAL LOW (ref 3.5–5.1)
Sodium: 136 mmol/L (ref 135–145)

## 2019-09-30 MED ORDER — VANCOMYCIN HCL 1250 MG/250ML IV SOLN
1250.0000 mg | INTRAVENOUS | Status: DC
  Administered 2019-10-01: 1250 mg via INTRAVENOUS
  Filled 2019-09-30: qty 250

## 2019-09-30 MED ORDER — SODIUM BICARBONATE-DEXTROSE 150-5 MEQ/L-% IV SOLN
150.0000 meq | INTRAVENOUS | Status: DC
  Administered 2019-10-01: 150 meq via INTRAVENOUS
  Filled 2019-09-30 (×4): qty 1000

## 2019-09-30 MED ORDER — POTASSIUM CHLORIDE CRYS ER 20 MEQ PO TBCR
40.0000 meq | EXTENDED_RELEASE_TABLET | Freq: Once | ORAL | Status: AC
  Administered 2019-09-30: 40 meq via ORAL
  Filled 2019-09-30: qty 2

## 2019-09-30 NOTE — Progress Notes (Signed)
PROGRESS NOTE    Traci [name]  [id number] DOB: 07-25-1976 DOA: 09/28/2019 PCP: Seward Carol, MD   Brief Narrative: Patient is a 44 year old female with history of CKD stage IV, diabetes type 2, transverse myelitis/paraplegia/neurogenic bladder, recent history of C. difficile colitis on oral vancomycin since 09/26/2019 who presents from [facility] with complaints of bilateral lower abdominal discomfort.  She was diagnosed with Covid in mid January and she has recovered.  She is paraplegic at baseline.She was having diarrhea.  CT imaging done here showed segmental areas of colonic wall thickening consistent with colitis, moderate fecal retention, and non obstructing right renal calculi.  Patient was febrile, tachycardic, hypotensive and had leukocytosis on presentation.  Admitted for management of sepsis.  Assessment & Plan:   Principal Problem:   Sepsis ([place]) Active Problems:   Transverse myelitis ([place])   Diabetes mellitus type 2 in obese ([facility])   Acute lower UTI   C. difficile diarrhea   Sepsis: Present on admission.  Was febrile, tachycardic, hypotensive.  Has leukocytosis.  Blood cultures have been sent.  Continue IV vancomycin, IV cefepime.Source cud be infected sacral ulcer or from C. Difficile itself.  Cultures have been sent, no growth till date.  Currently she is afebrile.  Recent C. difficile colitis: Was on oral vancomycin which was started on 09/26/2019.  She was having diarrhea.This is her second episode of C. difficile colitis CT imaging showed colitis.  She was complaining of abdominal pain in the nursing facilty.  Continue oral vancomycin.  Patient had been having large loose  bowel movements in the emergency department.Diarrhoea might have slowed down.  Hypertension: Currently blood pressure low.  Antihypertensives on hold  Suspected UTI: Urine culture showed multiple species.  Continue current antibiotics.  She has a chronic indwelling Foley catheter.Urine  looked turbid on presentation.  AKI on CKD stage IV: Her baseline creatinine ranges from 2.5-2.8.  Might have mild AKI on presentation.  Had metabolic acidosis with CO2 of just 13.  Continue bicarb drip .Kidney function improving.  Severe protein calorie malnutrition: Dietitian consulted.  Chronic iron deficiency anemia: Currently H&H stable.  She has chronic iron deficiency and follows with hematology as an outpatient.Iron studies showed low iron.  She will be given a dose of IV iron when appropriate before discharge.  She should continue iron supplementation on discharge.  Diabetes type 2: Continue sliding scale insulin.Monitor CBGs  Stage III -IV sacral ulcer on the perianal area: Present on admission.; wound care consulted.  General surgery consulted and they did bedside debridement.  No need of extensive debridement at present.   MRI of the sacrum ruled out osteomyelitis.  Continue wound care         DVT prophylaxis:Heparin [place] Code Status: Full Family Communication: None present at the bedside Disposition Plan: Patient is from skilled nursing facility.  She is not clinically ready for discharge because of ongoing diarrhea, AKI, suspected sepsis.  She will be discharged to skilled nursing facility when medically stable.   Consultants: general surgery  Procedures: Bedside sacral wound debridement  Antimicrobials:  Anti-infectives (From admission, onward)   Start     Dose/Rate Route Frequency Ordered Stop   10/01/19 1200  vancomycin (VANCOREADY) IVPB 1250 mg/250 mL     1,250 mg 166.7 mL/hr over 90 Minutes Intravenous Every 48 hours 09/30/19 0752     09/30/19 0600  vancomycin (VANCOREADY) IVPB 500 mg/100 mL  Status:  Discontinued     500 mg 100 mL/hr over 60 Minutes Intravenous Every  24 hours 09/28/19 2142 09/30/19 0752   09/29/19 2200  ceFEPIme (MAXIPIME) 2 g in sodium chloride 0.9 % 100 mL IVPB     2 g 200 mL/hr over 30 Minutes Intravenous Every 24 hours 09/28/19 2142      09/29/19 0600  metroNIDAZOLE (FLAGYL) IVPB 500 mg  Status:  Discontinued     500 mg 100 mL/hr over 60 Minutes Intravenous Every 8 hours 09/29/19 0112 09/29/19 1236   09/29/19 0200  vancomycin (VANCOCIN) 50 mg/mL oral solution 125 mg     125 mg Oral 3 times daily before meals & bedtime 09/29/19 0112     09/28/19 2030  ceFEPIme (MAXIPIME) 2 g in sodium chloride 0.9 % 100 mL IVPB     2 g 200 mL/hr over 30 Minutes Intravenous  Once 09/28/19 2017 09/28/19 2126   09/28/19 2030  metroNIDAZOLE (FLAGYL) IVPB 500 mg     500 mg 100 mL/hr over 60 Minutes Intravenous  Once 09/28/19 2017 09/28/19 2235   09/28/19 2030  vancomycin (VANCOCIN) IVPB 1000 mg/200 mL premix  Status:  Discontinued     1,000 mg 200 mL/hr over 60 Minutes Intravenous  Once 09/28/19 2017 09/28/19 2020   09/28/19 2030  Vancomycin (VANCOCIN) 1,250 mg in sodium chloride 0.9 % 250 mL IVPB     1,250 mg 166.7 mL/hr over 90 Minutes Intravenous  Once 09/28/19 2020 09/29/19 0040      Subjective:  Patient seen and examined the bedside this morning.  Looked comfortable.  She was still having diarrhea last night but the volume and frequency might have slowed down.  Denies any abdominal pain.  Eating her breakfast.  Blood pressure soft   Objective: Vitals:   09/29/19 2146 09/29/19 2309 09/30/19 0251 09/30/19 0852  BP: 124/60 116/64 100/63 (!) 94/50  Pulse: (!) 113 100 (!) 107   Resp: 20 20 20    Temp: 98.6 F (37 C) 98.6 F (37 C) 98.1 F (36.7 C) 98.4 F (36.9 C)  TempSrc: Oral Oral Oral Oral  SpO2: 98% 99% 96%   Weight:      Height:        Intake/Output Summary (Last 24 hours) at 09/30/2019 0933 Last data filed at 09/30/2019 0253 Gross per 24 hour  Intake 1715.02 ml  Output 1900 ml  Net -184.98 ml   Filed Weights   09/28/19 2005 09/28/19 2129  Weight: 117.9 kg 127 kg    Examination:  General exam: Morbidly obese, not in distress Respiratory system: Bilateral equal air entry, normal vesicular breath sounds, no wheezes  or crackles  Cardiovascular system: Sinus tachycardia. No JVD, murmurs, rubs, gallops or clicks. Gastrointestinal system: Abdomen is distended, soft and nontender. No organomegaly or masses felt. Normal bowel sounds heard. Central nervous system: Alert and oriented. No focal neurological deficits. Extremities: Trace bilateral lower extremity edema, no clubbing ,no cyanosis. Skin: 2 Stage III sacral ulcers.Present on admission    Data Reviewed: I have personally reviewed following labs and imaging studies  CBC: Recent Labs  Lab 09/28/19 2026 09/28/19 2050 09/29/19 0356 09/30/19 0336  WBC 15.8*  --  17.4* 19.0*  NEUTROABS 9.3*  --   --  9.8*  HGB 8.7* 9.2* 8.0* 7.8*  HCT 28.2* 27.0* 25.8* 24.5*  MCV 74.0*  --  73.9* 72.5*  PLT 487*  --  474* 0000000*   Basic Metabolic Panel: Recent Labs  Lab 09/28/19 2026 09/28/19 2050 09/29/19 0356 09/30/19 0336  NA 133* 131* 136 136  K 3.8 3.8 3.8 3.1*  CL 106 105 111 108  CO2 15*  --  13* 17*  GLUCOSE 154* 150* 145* 128*  BUN 41* 41* 42* 45*  CREATININE 3.02* 3.10* 2.86* 2.61*  CALCIUM 8.3*  --  7.9* 7.9*   GFR: Estimated Creatinine Clearance: 33.6 mL/min (A) (by C-G formula based on SCr of 2.61 mg/dL (H)). Liver Function Tests: Recent Labs  Lab 09/28/19 2026 09/29/19 0356  AST 19 10*  ALT 14 11  ALKPHOS 63 61  BILITOT 0.6 0.7  PROT 6.1* 5.8*  ALBUMIN 2.0* 1.8*   No results for input(s): LIPASE, AMYLASE in the last 168 hours. No results for input(s): AMMONIA in the last 168 hours. Coagulation Profile: Recent Labs  Lab 09/28/19 2026  INR 1.2   Cardiac Enzymes: No results for input(s): CKTOTAL, CKMB, CKMBINDEX, TROPONINI in the last 168 hours. BNP (last 3 results) No results for input(s): PROBNP in the last 8760 hours. HbA1C: No results for input(s): HGBA1C in the last 72 hours. CBG: No results for input(s): GLUCAP in the last 168 hours. Lipid Profile: No results for input(s): CHOL, HDL, LDLCALC, TRIG, CHOLHDL,  LDLDIRECT in the last 72 hours. Thyroid Function Tests: No results for input(s): TSH, T4TOTAL, FREET4, T3FREE, THYROIDAB in the last 72 hours. Anemia Panel: Recent Labs    09/29/19 1234  FERRITIN 416*  TIBC 134*  IRON 16*   Sepsis Labs: Recent Labs  Lab 09/28/19 2026  LATICACIDVEN 1.6    Recent Results (from the past 240 hour(s))  SARS CORONAVIRUS 2 (TAT 6-24 HRS) Nasopharyngeal Nasopharyngeal Swab     Status: None   Collection Time: 09/28/19  1:05 AM   Specimen: Nasopharyngeal Swab  Result Value Ref Range Status   SARS Coronavirus 2 NEGATIVE NEGATIVE Final    Comment: (NOTE) SARS-CoV-2 target nucleic acids are NOT DETECTED. The SARS-CoV-2 RNA is generally detectable in upper and lower respiratory specimens during the acute phase of infection. Negative results do not preclude SARS-CoV-2 infection, do not rule out co-infections with other pathogens, and should not be used as the sole basis for treatment or other patient management decisions. Negative results must be combined with clinical observations, patient history, and epidemiological information. The expected result is Negative. Fact Sheet for Patients: SugarRoll.be Fact Sheet for Healthcare Providers: https://www.woods-mathews.com/ This test is not yet approved or cleared by the [place] FDA and  has been authorized for detection and/or diagnosis of SARS-CoV-2 by FDA under an Emergency Use Authorization (EUA). This EUA will remain  in effect (meaning this test can be used) for the duration of the COVID-19 declaration under Section 56 4(b)(1) of the Act, 21 U.S.C. section 360bbb-3(b)(1), unless the authorization is terminated or revoked sooner. Performed at [facility], [place] [address] [place], [place] [address]   Blood Culture (routine x 2)     Status: None (Preliminary result)   Collection Time: 09/28/19  8:26 PM   Specimen: BLOOD  Result Value Ref Range  Status   Specimen Description BLOOD RIGHT ARM  Final   Special Requests   Final    BOTTLES DRAWN AEROBIC ONLY Blood Culture results may not be optimal due to an inadequate volume of blood received in culture bottles   Culture   Final    NO GROWTH < 12 HOURS Performed at [facility], [place] [address], [place], [place] [address]    Report Status PENDING  Incomplete  Urine culture     Status: Abnormal   Collection Time: 09/28/19  9:18 PM  Specimen: In/Out Cath Urine  Result Value Ref Range Status   Specimen Description IN/OUT CATH URINE  Final   Special Requests   Final    NONE Performed at [facility], [address], [place], [place] [address]    Culture MULTIPLE SPECIES PRESENT, SUGGEST RECOLLECTION (A)  Final   Report Status 09/29/2019 FINAL  Final  Blood Culture (routine x 2)     Status: None (Preliminary result)   Collection Time: 09/28/19  9:45 PM   Specimen: BLOOD  Result Value Ref Range Status   Specimen Description BLOOD LEFT ANTECUBITAL  Final   Special Requests   Final    BOTTLES DRAWN AEROBIC AND ANAEROBIC Blood Culture results may not be optimal due to an inadequate volume of blood received in culture bottles   Culture   Final    NO GROWTH < 12 HOURS Performed at [facility], [place] [address], [place], [place] [address]    Report Status PENDING  Incomplete  Respiratory Panel by RT PCR (Flu A&B, Covid) - Nasopharyngeal Swab     Status: None   Collection Time: 09/29/19  1:29 AM   Specimen: Nasopharyngeal Swab  Result Value Ref Range Status   SARS Coronavirus 2 by RT PCR NEGATIVE NEGATIVE Final    Comment: (NOTE) SARS-CoV-2 target nucleic acids are NOT DETECTED. The SARS-CoV-2 RNA is generally detectable in upper respiratoy specimens during the acute phase of infection. The lowest concentration of SARS-CoV-2 viral copies this assay can detect is 131 copies/mL. A negative result does not preclude SARS-Cov-2 infection and should not be used as the  sole basis for treatment or other patient management decisions. A negative result may occur with  improper specimen collection/handling, submission of specimen other than nasopharyngeal swab, presence of viral mutation(s) within the areas targeted by this assay, and inadequate number of viral copies (<131 copies/mL). A negative result must be combined with clinical observations, patient history, and epidemiological information. The expected result is Negative. Fact Sheet for Patients:  PinkCheek.be Fact Sheet for Healthcare Providers:  GravelBags.it This test is not yet ap proved or cleared by the [place] FDA and  has been authorized for detection and/or diagnosis of SARS-CoV-2 by FDA under an Emergency Use Authorization (EUA). This EUA will remain  in effect (meaning this test can be used) for the duration of the COVID-19 declaration under Section 564(b)(1) of the Act, 21 U.S.C. section 360bbb-3(b)(1), unless the authorization is terminated or revoked sooner.    Influenza A by PCR NEGATIVE NEGATIVE Final   Influenza B by PCR NEGATIVE NEGATIVE Final    Comment: (NOTE) The Xpert Xpress SARS-CoV-2/FLU/RSV assay is intended as an aid in  the diagnosis of influenza from Nasopharyngeal swab specimens and  should not be used as a sole basis for treatment. Nasal washings and  aspirates are unacceptable for Xpert Xpress SARS-CoV-2/FLU/RSV  testing. Fact Sheet for Patients: PinkCheek.be Fact Sheet for Healthcare Providers: GravelBags.it This test is not yet approved or cleared by the [place] FDA and  has been authorized for detection and/or diagnosis of SARS-CoV-2 by  FDA under an Emergency Use Authorization (EUA). This EUA will remain  in effect (meaning this test can be used) for the duration of the  Covid-19 declaration under Section 564(b)(1) of the Act, 21   U.S.C. section 360bbb-3(b)(1), unless the authorization is  terminated or revoked. Performed at [facility], [place] [address], [place], [place] [address]   MRSA PCR Screening  Status: None   Collection Time: 09/29/19  4:47 AM   Specimen: Nasal Mucosa; Nasopharyngeal  Result Value Ref Range Status   MRSA by PCR NEGATIVE NEGATIVE Final    Comment:        The GeneXpert MRSA Assay (FDA approved for NASAL specimens only), is one component of a comprehensive MRSA colonization surveillance program. It is not intended to diagnose MRSA infection nor to guide or monitor treatment for MRSA infections. Performed at [facility], [place] [address], [place], [place] [address]          Radiology Studies: CT ABDOMEN PELVIS WO CONTRAST  Result Date: 09/28/2019 CLINICAL DATA:  Constipation, fecal impaction, currently being treated for C difficile colitis. EXAM: CT ABDOMEN AND PELVIS WITHOUT CONTRAST TECHNIQUE: Multidetector CT imaging of the abdomen and pelvis was performed following the standard protocol without IV contrast. COMPARISON:  03/20/2019 FINDINGS: Lower chest: No acute pleural or parenchymal lung disease. Hepatobiliary: No focal liver abnormality is seen. No gallstones, gallbladder wall thickening, or biliary dilatation. Pancreas: Unremarkable. No pancreatic ductal dilatation or surrounding inflammatory changes. Spleen: Normal in size without focal abnormality. Adrenals/Urinary Tract: Adrenals are unremarkable. There is bilateral renal cortical atrophy. Faint 3-4 mm nonobstructing calculi are seen within the right kidney. There is mild right-sided hydronephrosis and hydroureter, though I do not see a clear obstructing calculus or mass. Overall this is improved since previous study. On the left, there is minimal distension of the left renal pelvis and proximal left ureter. No evidence of left-sided calculi. The bladder is decompressed with a Foley catheter. Stomach/Bowel: There  is moderate residual stool within the colon, though improved since prior study. Segmental areas of wall thickening are seen involving the cecum and sigmoid colon. Findings are consistent with multifocal colitis. No bowel obstruction. Vascular/Lymphatic: Multiple subcentimeter lymph nodes are seen within the retroperitoneum and surrounding the sigmoid colon, likely inflammatory. Vascular structures are grossly unremarkable. Reproductive: Uterus and bilateral adnexa are unremarkable. Other: There is prominent pericolonic fat stranding involving the rectosigmoid colon. No free fluid within the abdomen or pelvis. No free gas. Musculoskeletal: No acute or destructive bony lesions. Reconstructed images demonstrate no additional findings. IMPRESSION: 1. Segmental areas of colonic wall thickening consistent with colitis. By report, the patient is currently being treated for C difficile colitis. 2. Moderate fecal retention, though decreased stool since prior study. 3. Nonobstructing right renal calculi. There is dilatation of the bilateral renal pelves and ureters, right greater than left, though less pronounced than on prior study. This may be related underlying neurogenic bladder. 4. Pericolonic fat stranding surrounding the sigmoid colon, with multiple small lymph nodes, likely reactive. Electronically Signed   By: Randa Ngo M.D.   On: 09/28/2019 22:21   MR SACRUM SI JOINTS WO CONTRAST  Result Date: 09/29/2019 CLINICAL DATA:  Decubitus ulcer over the sacrum. EXAM: MRI SACRUM WITHOUT CONTRAST TECHNIQUE: Multiplanar, multisequence MR imaging of the sacrum was performed. No intravenous contrast was administered. COMPARISON:  CT scan of the abdomen and pelvis dated 09/28/2019 FINDINGS: There are 2 decubitus ulcers in the midline posteriorly superficial to the sacrum. Each of these is approximately 3 cm deep with a communication between the 2. The ulcers do not extend to the bony sacrum or to the paraspinal  musculature. There are no abscesses. No osteomyelitis. Note is made of bilateral hip effusions. The bones of the hips appear normal. There is a focal soft disc protrusion to the left of midline at L4-5 which might affect the left L5  nerve. No adenopathy or mass lesions. Foley catheter in the bladder. There is diffuse mucosal thickening in the colon consistent with the patient's history of C difficile colitis. Nonspecific slight presacral edema. IMPRESSION: 1. No evidence of osteomyelitis or soft tissue abscess. 2. 2 decubitus ulcers in the midline posteriorly superficial to the sacrum. 3. Focal soft disc protrusion to the left of midline at L4-5 which might affect the left L5 nerve. 4. Diffuse mucosal thickening in the colon consistent with the patient's history of C difficile colitis. Electronically Signed   By: Lorriane Shire M.D.   On: 09/29/2019 16:50   DG Chest Port 1 View  Result Date: 09/28/2019 CLINICAL DATA:  Sepsis EXAM: PORTABLE CHEST 1 VIEW COMPARISON:  10/13/2016 FINDINGS: The heart size and mediastinal contours are within normal limits. Both lungs are clear. The visualized skeletal structures are unremarkable. IMPRESSION: No active disease. Electronically Signed   By: Ulyses Jarred M.D.   On: 09/28/2019 21:04        Scheduled Meds: . Chlorhexidine Gluconate Cloth  6 each Topical Daily  . feeding supplement (PRO-STAT SUGAR FREE 64)  30 mL Oral BID  . heparin  5,000 Units Subcutaneous Q8H  . vancomycin  125 mg Oral TID AC & HS   Continuous Infusions: . ceFEPime (MAXIPIME) IV 2 g (09/29/19 2114)  .  sodium bicarbonate  infusion 1000 mL 100 mL/hr at 09/30/19 0232  . [START ON 10/01/2019] vancomycin       LOS: 2 days    Time spent: 35 mins.More than 50% of that time was spent in counseling and/or coordination of care.      Shelly Coss, MD [facility] P2/13/2021, 9:33 AM

## 2019-09-30 NOTE — Progress Notes (Signed)
Pharmacy Antibiotic Note  Traci [name] is a 44 y.o. female admitted on 09/28/2019 with sepsis due to a sacral wound (no osteo)  Patient has recently been diagnosed with C. Difficile infection prescribed oral vancomycin filled on 09/25/2018. Pharmacy has been consulted for vancomycin and cefepime dosing.     Plan: -Change vancomycin to 1250mg  IV q48hr (Scr used 2.6. Est AUC 475) -Continue cefepime 2gm IV q24 - F/u C. difficile treatment and antimicrobial deescalation/discontinuation as appropriate (consider discontinuing on 2/20 for 10 days total; started on 2/10 PTA)    Height: 5' (152.4 cm) Weight: 280 lb (127 kg) IBW/kg (Calculated) : 45.5  Temp (24hrs), Avg:98.4 F (36.9 C), Min:98.1 F (36.7 C), Max:98.6 F (37 C)  Recent Labs  Lab 09/28/19 2026 09/28/19 2050 09/29/19 0356 09/30/19 0336  WBC 15.8*  --  17.4* 19.0*  CREATININE 3.02* 3.10* 2.86* 2.61*  LATICACIDVEN 1.6  --   --   --     Estimated Creatinine Clearance: 33.6 mL/min (A) (by C-G formula based on SCr of 2.61 mg/dL (H)).    No Known Allergies  Antimicrobials this admission: Vancomycin 2/11 >>  Cefepime 2/11 >>  Flagyl 2/11 x1  Dose adjustments this admission:  Microbiology results: 2/11 BCx: sent 2/11 UCx: multiple   Thank you for allowing pharmacy to be a part of this patient's care.  Hildred Laser, PharmD Clinical Pharmacist **Pharmacist phone directory can now be found on [email or web address] ([email or web address]).  Listed under [facility].

## 2019-09-30 NOTE — Evaluation (Addendum)
Physical Therapy Evaluation Patient Details Name: Traci [name] MRN: [id number] DOB: [birth year] Today's Date: 09/30/2019   History of Present Illness  Patient is a 44 year old female with history of CKD stage IV, diabetes type 2, transverse myelitis/paraplegia/neurogenic bladder, recent history of C. difficile colitis on oral vancomycin since 09/26/2019, recent Covid infection in mid January, who presents from [facility] with complaints of bilateral lower abdominal discomfort and diarrhea. Admitted for management of sepsis.   Clinical Impression  Pt admitted with above. Prior to admission, pt lives at [facility] and states she has recently been discharged by PT. She is typically modified independent with slide board transfers into a wheelchair. On PT evaluation, pt requiring moderate assist for bed mobility; deferred transfer due to bowel incontinence and provided peri care in supine with RN assist. Pt tachycardic with mobility, 108-133 bpm. Would benefit from follow up physical therapy at SNF for strengthening and transfer training.     Follow Up Recommendations SNF    Equipment Recommendations  None recommended by PT    Recommendations for Other Services       Precautions / Restrictions Precautions Precautions: Fall;Other (comment) Precaution Comments: Paraplegic Restrictions Weight Bearing Restrictions: No      Mobility  Bed Mobility Overal bed mobility: Needs Assistance Bed Mobility: Rolling;Sidelying to Sit;Sit to Sidelying Rolling: Min assist Sidelying to sit: Mod assist     Sit to sidelying: Mod assist General bed mobility comments: Pt able to use momentum to roll onto side with minA, modA for trunk elevation to upright and then for BLE negotiation back into bed upon return. Rolling multiple times for peri care and linen change.   Transfers                 General transfer comment: deferred due to bowel incontinence  Ambulation/Gait                 Stairs            Wheelchair Mobility    Modified Rankin (Stroke Patients Only)       Balance Overall balance assessment: Needs assistance Sitting-balance support: Feet unsupported;Bilateral upper extremity supported Sitting balance-Leahy Scale: Poor Sitting balance - Comments: Supervision for safety, reliant on bilateral hand support                                     Pertinent Vitals/Pain Pain Assessment: Faces Faces Pain Scale: Hurts a little bit Pain Location: sacrum Pain Descriptors / Indicators: Discomfort Pain Intervention(s): Monitored during session;Repositioned    Home Living Family/patient expects to be discharged to:: Skilled nursing facility                      Prior Function Level of Independence: Needs assistance   Gait / Transfers Assistance Needed: ModI with slide board transfers to whelechair           Hand Dominance        Extremity/Trunk Assessment   Upper Extremity Assessment Upper Extremity Assessment: Overall WFL for tasks assessed    Lower Extremity Assessment Lower Extremity Assessment: RLE deficits/detail;LLE deficits/detail RLE Deficits / Details: Strength 0/5, ROM WFL LLE Deficits / Details: Strength 0/5, ROM WFL       Communication   Communication: No difficulties  Cognition Arousal/Alertness: Awake/alert Behavior During Therapy: WFL for tasks assessed/performed Overall Cognitive Status: Within Functional Limits for tasks assessed  General Comments      Exercises     Assessment/Plan    PT Assessment Patient needs continued PT services  PT Problem List Decreased strength;Decreased balance;Decreased mobility;Impaired sensation;Decreased skin integrity;Obesity       PT Treatment Interventions Functional mobility training;Therapeutic activities;Therapeutic exercise;Balance training;Patient/family education;Wheelchair mobility  training    PT Goals (Current goals can be found in the Care Plan section)  Acute Rehab PT Goals Patient Stated Goal: "no more C diff." PT Goal Formulation: With patient Time For Goal Achievement: 10/14/19 Potential to Achieve Goals: Fair    Frequency Min 2X/week   Barriers to discharge        Co-evaluation               AM-PAC PT "6 Clicks" Mobility  Outcome Measure Help needed turning from your back to your side while in a flat bed without using bedrails?: A Little Help needed moving from lying on your back to sitting on the side of a flat bed without using bedrails?: A Lot Help needed moving to and from a bed to a chair (including a wheelchair)?: A Lot Help needed standing up from a chair using your arms (e.g., wheelchair or bedside chair)?: Total Help needed to walk in hospital room?: Total Help needed climbing 3-5 steps with a railing? : Total 6 Click Score: 10    End of Session   Activity Tolerance: Patient tolerated treatment well Patient left: in bed;with call bell/phone within reach Nurse Communication: Mobility status PT Visit Diagnosis: Other abnormalities of gait and mobility (R26.89)    Time: [id number] PT Time Calculation (min) (ACUTE ONLY): 42 min   Charges:   PT Evaluation $PT Eval Moderate Complexity: 1 Mod PT Treatments $Therapeutic Activity: 23-37 mins          Wyona Almas, PT, DPT Acute Rehabilitation Services Pager [phone] Office [phone]   Deno Etienne 09/30/2019, 12:36 PM

## 2019-10-01 LAB — BASIC METABOLIC PANEL
Anion gap: 12 (ref 5–15)
BUN: 47 mg/dL — ABNORMAL HIGH (ref 6–20)
CO2: 22 mmol/L (ref 22–32)
Calcium: 7.7 mg/dL — ABNORMAL LOW (ref 8.9–10.3)
Chloride: 102 mmol/L (ref 98–111)
Creatinine, Ser: 2.57 mg/dL — ABNORMAL HIGH (ref 0.44–1.00)
GFR calc Af Amer: 25 mL/min — ABNORMAL LOW (ref >60)
GFR calc non Af Amer: 22 mL/min — ABNORMAL LOW (ref >60)
Glucose, Bld: 158 mg/dL — ABNORMAL HIGH (ref 70–99)
Potassium: 3.6 mmol/L (ref 3.5–5.1)
Sodium: 136 mmol/L (ref 135–145)

## 2019-10-01 LAB — CBC WITH DIFFERENTIAL/PLATELET
Abs Immature Granulocytes: 2.99 10*3/uL — ABNORMAL HIGH (ref 0.00–0.07)
Basophils Absolute: 0.1 10*3/uL (ref 0.0–0.1)
Basophils Relative: 1 %
Eosinophils Absolute: 1.1 10*3/uL — ABNORMAL HIGH (ref 0.0–0.5)
Eosinophils Relative: 5 %
HCT: 25.5 % — ABNORMAL LOW (ref 36.0–46.0)
Hemoglobin: 7.8 g/dL — ABNORMAL LOW (ref 12.0–15.0)
Immature Granulocytes: 14 %
Lymphocytes Relative: 21 %
Lymphs Abs: 4.5 10*3/uL — ABNORMAL HIGH (ref 0.7–4.0)
MCH: 22.6 pg — ABNORMAL LOW (ref 26.0–34.0)
MCHC: 30.6 g/dL (ref 30.0–36.0)
MCV: 73.9 fL — ABNORMAL LOW (ref 80.0–100.0)
Monocytes Absolute: 1.3 10*3/uL — ABNORMAL HIGH (ref 0.1–1.0)
Monocytes Relative: 6 %
Neutro Abs: 11.2 10*3/uL — ABNORMAL HIGH (ref 1.7–7.7)
Neutrophils Relative %: 53 %
Platelets: 596 10*3/uL — ABNORMAL HIGH (ref 150–400)
RBC: 3.45 MIL/uL — ABNORMAL LOW (ref 3.87–5.11)
RDW: 19.4 % — ABNORMAL HIGH (ref 11.5–15.5)
WBC: 21.2 10*3/uL — ABNORMAL HIGH (ref 4.0–10.5)
nRBC: 0.4 % — ABNORMAL HIGH (ref 0.0–0.2)

## 2019-10-01 LAB — PROCALCITONIN: Procalcitonin: >150 ng/mL

## 2019-10-01 MED ORDER — SODIUM BICARBONATE 8.4 % IV SOLN
INTRAVENOUS | Status: DC
  Filled 2019-10-01: qty 150

## 2019-10-01 MED ORDER — SODIUM CHLORIDE 0.9 % IV SOLN: INTRAVENOUS | Status: DC

## 2019-10-01 NOTE — NC FL2 (Signed)
[place] LEVEL OF CARE SCREENING TOOL     IDENTIFICATION  Patient Name: Traci [name] Birthdate: [birth year] Sex: female Admission Date (Current Location): 09/28/2019  [facility] [place] and [place] Number:  Herbalist and Address:  [facility] [place], [place] [address] [place], [place] [address]      Provider Number: [id number]  Attending Physician Name and Address:  Shelly Coss, MD  Relative Name and Phone Number:  Torrie Mayers (sister) [phone]    Current Level of Care: Hospital Recommended Level of Care: [place] Prior Approval Number:    Date Approved/Denied:   PASRR Number: [id number]  Discharge Plan: SNF    Current Diagnoses: Patient Active Problem List   Diagnosis Date Noted  . C. difficile diarrhea 09/29/2019  . Sepsis ([place]) 09/28/2019  . Proteus infection   . Fever   . Generalized abdominal pain   . Non-intractable vomiting with nausea   . Hemorrhoids   . Neuropathic pain   . Acute blood loss anemia   . Transaminitis   . Acute lower UTI   . Hyponatremia   . Peroneal DVT (deep venous thrombosis), left ([place])   . Thoracic myelopathy   . Neurogenic bladder   . Neurogenic bowel   . Bilateral leg weakness   . 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 Myelitis ([place])   . Paresthesia of both lower extremities   . Morbid obesity ([place])   . Spinal stenosis of lumbar region   . DDD (degenerative disc disease), lumbosacral   . Diabetes mellitus type 2 in obese ([place])   . Paraplegia ([place])   . Transverse myelitis ([place]) 12/25/2016  . Steroid-induced hyperglycemia 12/25/2016    Orientation RESPIRATION BLADDER Height & Weight     Self, Time, Situation, Place  Normal Incontinent(urethral catheter) Weight: (!) 306 lb 7 oz (139 kg) Height:  5' (152.4 cm)  BEHAVIORAL SYMPTOMS/MOOD NEUROLOGICAL BOWEL NUTRITION STATUS      Incontinent Diet(see discharge summary)  AMBULATORY STATUS COMMUNICATION OF NEEDS Skin   Extensive Assist  Verbally Other (Comment)(MASD buttocks/sacrum, pressure injursy stage 4 scarum, stage 2 buttocks)                       Personal Care Assistance Level of Assistance  Bathing, Dressing, Total care, Feeding Bathing Assistance: Limited assistance Feeding assistance: Independent Dressing Assistance: Limited assistance Total Care Assistance: Maximum assistance   Functional Limitations Info  Sight, Speech, Hearing Sight Info: Impaired Hearing Info: Adequate Speech Info: Adequate    SPECIAL CARE FACTORS FREQUENCY  PT (By licensed PT), OT (By licensed OT)     PT Frequency: min 5x weekly OT Frequency: min 5x weekly            Contractures Contractures Info: Not present    Additional Factors Info  Code Status, Allergies Code Status Info: full Allergies Info: No Known Allergies           Current Medications (10/01/2019):  This is the current hospital active medication list Current Facility-Administered Medications  Medication Dose Route Frequency Provider Last Rate Last Admin  . 0.9 %  sodium chloride infusion   Intravenous Continuous Shelly Coss, MD 75 mL/hr at 10/01/19 0858 New Bag/Given (Non-Interop) at 10/01/19 0858  . acetaminophen (TYLENOL) tablet 650 mg  650 mg Oral Q6H PRN Jani Gravel, MD   650 mg at 09/29/19 [id number]   Or  . acetaminophen (TYLENOL) suppository 650 mg  650 mg Rectal Q6H PRN Jani Gravel, MD      .  ceFEPIme (MAXIPIME) 2 g in sodium chloride 0.9 % 100 mL IVPB  2 g Intravenous Q24H Fredia Sorrow, MD 200 mL/hr at 09/30/19 2303 2 g at 09/30/19 2303  . Chlorhexidine Gluconate Cloth 2 % PADS 6 each  6 each Topical Daily Shelly Coss, MD   6 each at 09/30/19 [phone]  . feeding supplement (PRO-STAT SUGAR FREE 64) liquid 30 mL  30 mL Oral BID Jani Gravel, MD   30 mL at 10/01/19 0853  . heparin injection 5,000 Units  5,000 Units Subcutaneous Q8H Jani Gravel, MD   5,000 Units at 10/01/19 0612  . vancomycin (VANCOCIN) 50 mg/mL oral solution 125 mg  125 mg Oral TID  AC & HS Jani Gravel, MD   125 mg at 10/01/19 0858  . vancomycin (VANCOREADY) IVPB 1250 mg/250 mL  1,250 mg Intravenous Q48H Kris Mouton, [facility]         Discharge Medications: Please see discharge summary for a list of discharge medications.  Relevant Imaging Results:  Relevant Lab Results:   Additional Information SSN: [id number]  Alberteen Sam, LCSW

## 2019-10-01 NOTE — Progress Notes (Signed)
PROGRESS NOTE    Traci [name]  [id number] DOB: 1976-04-07 DOA: 09/28/2019 PCP: Seward Carol, MD   Brief Narrative: Patient is a 44 year old female with history of CKD stage IV, diabetes type 2, transverse myelitis/paraplegia/neurogenic bladder, recent history of C. difficile colitis on oral vancomycin since 09/26/2019 who presents from [facility] [facility] with complaints of bilateral lower abdominal discomfort.  She was diagnosed with Covid in mid January and she has recovered.  She is paraplegic at baseline.She was having diarrhea.  CT imaging done here showed segmental areas of colonic wall thickening consistent with colitis, moderate fecal retention, and non obstructing right renal calculi.  Patient was febrile, tachycardic, hypotensive and had leukocytosis on presentation.  Admitted for management of sepsis.  Assessment & Plan:   Principal Problem:   Sepsis ([place]) Active Problems:   Transverse myelitis ([place])   Diabetes mellitus type 2 in obese (HCC)   Acute lower UTI   C. difficile diarrhea   Sepsis: Present on admission.  Was febrile, tachycardic, hypotensive.  Has severe  leukocytosis. Elevated procalcitonin. Blood cultures have been sent. NGTD. Continue IV vancomycin, IV cefepime.Source cud be infected sacral ulcer or from C. Difficile itself.   Currently she is afebrile.We will plan for de-escalation of antibiotics.    Recurrent  C. difficile colitis: Was on oral vancomycin which was started on 09/26/2019.  She was having diarrhea.This is her second episode of C. difficile colitis CT imaging showed colitis.  She was complaining of abdominal pain in the nursing facilty.  Continue oral vancomycin.  Patient had been having large loose  bowel movements in the emergency department.Diarrhoea  have slowed down.  Hypertension: Currently blood pressure soft.  Antihypertensives on hold  Suspected UTI: Urine culture showed multiple species.  Continue current antibiotics.  She has a  chronic indwelling Foley catheter.Urine looked turbid on presentation.  AKI on CKD stage IV: Her baseline creatinine ranges from 2.5-2.8.  Might have mild AKI on presentation.  Had metabolic acidosis with CO2 of just 13. She was on  bicarb drip .Kidney function improving and currently at baseline.  Fluids changed to normal saline.  Severe protein calorie malnutrition: Dietitian consulted.  Chronic iron deficiency anemia: Currently H&H stable.  She has chronic iron deficiency and follows with hematology as an outpatient.Iron studies showed low iron.  She will be given a dose of IV iron when appropriate before discharge.  She should continue iron supplementation on discharge.  Diabetes type 2: Continue sliding scale insulin.Monitor CBGs  Stage III -IV sacral ulcer on the perianal area: Present on admission.; wound care consulted.  General surgery consulted and they did bedside debridement.  No need of extensive debridement at present.   MRI of the sacrum ruled out osteomyelitis.  Continue wound care         DVT prophylaxis:Heparin [place] Code Status: Full Family Communication: None present at the bedside Disposition Plan: Patient is from skilled nursing facility.  She is not clinically ready for discharge because of worsening leukocytosis, C. Difficile. she will be discharged to skilled nursing facility when medically stable.   Consultants: general surgery  Procedures: Bedside sacral wound debridement  Antimicrobials:  Anti-infectives (From admission, onward)   Start     Dose/Rate Route Frequency Ordered Stop   10/01/19 1200  vancomycin (VANCOREADY) IVPB 1250 mg/250 mL     1,250 mg 166.7 mL/hr over 90 Minutes Intravenous Every 48 hours 09/30/19 0752     09/30/19 0600  vancomycin (VANCOREADY) IVPB 500 mg/100 mL  Status:  Discontinued     500 mg 100 mL/hr over 60 Minutes Intravenous Every 24 hours 09/28/19 2142 09/30/19 0752   09/29/19 2200  ceFEPIme (MAXIPIME) 2 g in sodium chloride 0.9  % 100 mL IVPB     2 g 200 mL/hr over 30 Minutes Intravenous Every 24 hours 09/28/19 2142     09/29/19 0600  metroNIDAZOLE (FLAGYL) IVPB 500 mg  Status:  Discontinued     500 mg 100 mL/hr over 60 Minutes Intravenous Every 8 hours 09/29/19 0112 09/29/19 1236   09/29/19 0200  vancomycin (VANCOCIN) 50 mg/mL oral solution 125 mg     125 mg Oral 3 times daily before meals & bedtime 09/29/19 0112     09/28/19 2030  ceFEPIme (MAXIPIME) 2 g in sodium chloride 0.9 % 100 mL IVPB     2 g 200 mL/hr over 30 Minutes Intravenous  Once 09/28/19 2017 09/28/19 2126   09/28/19 2030  metroNIDAZOLE (FLAGYL) IVPB 500 mg     500 mg 100 mL/hr over 60 Minutes Intravenous  Once 09/28/19 2017 09/28/19 2235   09/28/19 2030  vancomycin (VANCOCIN) IVPB 1000 mg/200 mL premix  Status:  Discontinued     1,000 mg 200 mL/hr over 60 Minutes Intravenous  Once 09/28/19 2017 09/28/19 2020   09/28/19 2030  Vancomycin (VANCOCIN) 1,250 mg in sodium chloride 0.9 % 250 mL IVPB     1,250 mg 166.7 mL/hr over 90 Minutes Intravenous  Once 09/28/19 2020 09/29/19 0040      Subjective:  Patient seen and examined at the bedside this morning.  In mild sinus tachycardia.  Looks comfortable.  Denies any abdominal pain.  Diarrhea has slowed down.  Objective: Vitals:   09/30/19 2219 10/01/19 0006 10/01/19 0300 10/01/19 0612  BP: 100/62 102/62 107/65   Pulse: 100 99 (!) 111   Resp: 18 16 20    Temp: 98.5 F (36.9 C) 98.5 F (36.9 C) 98 F (36.7 C)   TempSrc: Oral Oral Oral   SpO2: 98% 98% 97%   Weight:    (!) 139 kg  Height:        Intake/Output Summary (Last 24 hours) at 10/01/2019 0758 Last data filed at 10/01/2019 0515 Gross per 24 hour  Intake 1100 ml  Output 925 ml  Net 175 ml   Filed Weights   09/28/19 2005 09/28/19 2129 10/01/19 0612  Weight: 117.9 kg 127 kg (!) 139 kg    Examination:  General exam: Looks comfortable, morbidly obese HEENT:PERRL,Oral mucosa moist, Ear/Nose normal on gross exam Respiratory system:  Bilateral equal air entry, normal vesicular breath sounds, no wheezes or crackles  Cardiovascular system: Sinus tachycardia, No JVD, murmurs, rubs, gallops or clicks. Gastrointestinal system: Abdomen is nondistended, soft and nontender. No organomegaly or masses felt. Normal bowel sounds heard. Central nervous system: Alert and oriented.  Paraplegia  extremities: No edema, no clubbing ,no cyanosis Skin: Stage III sacral ulcers.Present on admission   Data Reviewed: I have personally reviewed following labs and imaging studies  CBC: Recent Labs  Lab 09/28/19 2026 09/28/19 2050 09/29/19 0356 09/30/19 0336 10/01/19 0306  WBC 15.8*  --  17.4* 19.0* 21.2*  NEUTROABS 9.3*  --   --  9.8* 11.2*  HGB 8.7* 9.2* 8.0* 7.8* 7.8*  HCT 28.2* 27.0* 25.8* 24.5* 25.5*  MCV 74.0*  --  73.9* 72.5* 73.9*  PLT 487*  --  474* 467* 0000000*   Basic Metabolic Panel: Recent Labs  Lab 09/28/19 2026 09/28/19 2050 09/29/19 0356 09/30/19 0336 10/01/19 [id number]  NA 133* 131* 136 136 136  K 3.8 3.8 3.8 3.1* 3.6  CL 106 105 111 108 102  CO2 15*  --  13* 17* 22  GLUCOSE 154* 150* 145* 128* 158*  BUN 41* 41* 42* 45* 47*  CREATININE 3.02* 3.10* 2.86* 2.61* 2.57*  CALCIUM 8.3*  --  7.9* 7.9* 7.7*   GFR: Estimated Creatinine Clearance: 36.2 mL/min (A) (by C-G formula based on SCr of 2.57 mg/dL (H)). Liver Function Tests: Recent Labs  Lab 09/28/19 2026 09/29/19 0356  AST 19 10*  ALT 14 11  ALKPHOS 63 61  BILITOT 0.6 0.7  PROT 6.1* 5.8*  ALBUMIN 2.0* 1.8*   No results for input(s): LIPASE, AMYLASE in the last 168 hours. No results for input(s): AMMONIA in the last 168 hours. Coagulation Profile: Recent Labs  Lab 09/28/19 2026  INR 1.2   Cardiac Enzymes: No results for input(s): CKTOTAL, CKMB, CKMBINDEX, TROPONINI in the last 168 hours. BNP (last 3 results) No results for input(s): PROBNP in the last 8760 hours. HbA1C: No results for input(s): HGBA1C in the last 72 hours. CBG: No results for  input(s): GLUCAP in the last 168 hours. Lipid Profile: No results for input(s): CHOL, HDL, LDLCALC, TRIG, CHOLHDL, LDLDIRECT in the last 72 hours. Thyroid Function Tests: No results for input(s): TSH, T4TOTAL, FREET4, T3FREE, THYROIDAB in the last 72 hours. Anemia Panel: Recent Labs    09/29/19 1234  FERRITIN 416*  TIBC 134*  IRON 16*   Sepsis Labs: Recent Labs  Lab 09/28/19 2026  LATICACIDVEN 1.6    Recent Results (from the past 240 hour(s))  SARS CORONAVIRUS 2 (TAT 6-24 HRS) Nasopharyngeal Nasopharyngeal Swab     Status: None   Collection Time: 09/28/19  1:05 AM   Specimen: Nasopharyngeal Swab  Result Value Ref Range Status   SARS Coronavirus 2 NEGATIVE NEGATIVE Final    Comment: (NOTE) SARS-CoV-2 target nucleic acids are NOT DETECTED. The SARS-CoV-2 RNA is generally detectable in upper and lower respiratory specimens during the acute phase of infection. Negative results do not preclude SARS-CoV-2 infection, do not rule out co-infections with other pathogens, and should not be used as the sole basis for treatment or other patient management decisions. Negative results must be combined with clinical observations, patient history, and epidemiological information. The expected result is Negative. Fact Sheet for Patients: SugarRoll.be Fact Sheet for Healthcare Providers: https://www.woods-mathews.com/ This test is not yet approved or cleared by the [place] FDA and  has been authorized for detection and/or diagnosis of SARS-CoV-2 by FDA under an Emergency Use Authorization (EUA). This EUA will remain  in effect (meaning this test can be used) for the duration of the COVID-19 declaration under Section 56 4(b)(1) of the Act, 21 U.S.C. section 360bbb-3(b)(1), unless the authorization is terminated or revoked sooner. Performed at [facility], [place] [address], [place], [place] [address]   Blood Culture (routine x 2)      Status: None (Preliminary result)   Collection Time: 09/28/19  8:26 PM   Specimen: BLOOD  Result Value Ref Range Status   Specimen Description BLOOD RIGHT ARM  Final   Special Requests   Final    BOTTLES DRAWN AEROBIC ONLY Blood Culture results may not be optimal due to an inadequate volume of blood received in culture bottles   Culture   Final    NO GROWTH 2 DAYS Performed at [facility], [place] [address], [place], [place] [address]    Report Status PENDING  Incomplete  Urine culture     Status: Abnormal   Collection Time: 09/28/19  9:18 PM   Specimen: In/Out Cath Urine  Result Value Ref Range Status   Specimen Description IN/OUT CATH URINE  Final   Special Requests   Final    NONE Performed at [facility], [address] [place], [place] [address]    Culture MULTIPLE SPECIES PRESENT, SUGGEST RECOLLECTION (A)  Final   Report Status 09/29/2019 FINAL  Final  Blood Culture (routine x 2)     Status: None (Preliminary result)   Collection Time: 09/28/19  9:45 PM   Specimen: BLOOD  Result Value Ref Range Status   Specimen Description BLOOD LEFT ANTECUBITAL  Final   Special Requests   Final    BOTTLES DRAWN AEROBIC AND ANAEROBIC Blood Culture results may not be optimal due to an inadequate volume of blood received in culture bottles   Culture   Final    NO GROWTH 2 DAYS Performed at [facility], [place] [address] [place], [place] [address]    Report Status PENDING  Incomplete  Respiratory Panel by RT PCR (Flu A&B, Covid) - Nasopharyngeal Swab     Status: None   Collection Time: 09/29/19  1:29 AM   Specimen: Nasopharyngeal Swab  Result Value Ref Range Status   SARS Coronavirus 2 by RT PCR NEGATIVE NEGATIVE Final    Comment: (NOTE) SARS-CoV-2 target nucleic acids are NOT DETECTED. The SARS-CoV-2 RNA is generally detectable in upper respiratoy specimens during the acute phase of infection. The lowest concentration of SARS-CoV-2 viral copies this assay can detect  is 131 copies/mL. A negative result does not preclude SARS-Cov-2 infection and should not be used as the sole basis for treatment or other patient management decisions. A negative result may occur with  improper specimen collection/handling, submission of specimen other than nasopharyngeal swab, presence of viral mutation(s) within the areas targeted by this assay, and inadequate number of viral copies (<131 copies/mL). A negative result must be combined with clinical observations, patient history, and epidemiological information. The expected result is Negative. Fact Sheet for Patients:  PinkCheek.be Fact Sheet for Healthcare Providers:  GravelBags.it This test is not yet ap proved or cleared by the [place] FDA and  has been authorized for detection and/or diagnosis of SARS-CoV-2 by FDA under an Emergency Use Authorization (EUA). This EUA will remain  in effect (meaning this test can be used) for the duration of the COVID-19 declaration under Section 564(b)(1) of the Act, 21 U.S.C. section 360bbb-3(b)(1), unless the authorization is terminated or revoked sooner.    Influenza A by PCR NEGATIVE NEGATIVE Final   Influenza B by PCR NEGATIVE NEGATIVE Final    Comment: (NOTE) The Xpert Xpress SARS-CoV-2/FLU/RSV assay is intended as an aid in  the diagnosis of influenza from Nasopharyngeal swab specimens and  should not be used as a sole basis for treatment. Nasal washings and  aspirates are unacceptable for Xpert Xpress SARS-CoV-2/FLU/RSV  testing. Fact Sheet for Patients: PinkCheek.be Fact Sheet for Healthcare Providers: GravelBags.it This test is not yet approved or cleared by the [place] FDA and  has been authorized for detection and/or diagnosis of SARS-CoV-2 by  FDA under an Emergency Use Authorization (EUA). This EUA will remain  in effect (meaning this  test can be used) for the duration of the  Covid-19 declaration under Section 564(b)(1) of the Act, 21  U.S.C. section 360bbb-3(b)(1), unless the authorization is  terminated or revoked. Performed  at [facility], [place] [address], [place], [place] [address]   MRSA PCR Screening     Status: None   Collection Time: 09/29/19  4:47 AM   Specimen: Nasal Mucosa; Nasopharyngeal  Result Value Ref Range Status   MRSA by PCR NEGATIVE NEGATIVE Final    Comment:        The GeneXpert MRSA Assay (FDA approved for NASAL specimens only), is one component of a comprehensive MRSA colonization surveillance program. It is not intended to diagnose MRSA infection nor to guide or monitor treatment for MRSA infections. Performed at [facility], [place] [address] [place], [place] [address]          Radiology Studies: MR SACRUM SI JOINTS WO CONTRAST  Result Date: 09/29/2019 CLINICAL DATA:  Decubitus ulcer over the sacrum. EXAM: MRI SACRUM WITHOUT CONTRAST TECHNIQUE: Multiplanar, multisequence MR imaging of the sacrum was performed. No intravenous contrast was administered. COMPARISON:  CT scan of the abdomen and pelvis dated 09/28/2019 FINDINGS: There are 2 decubitus ulcers in the midline posteriorly superficial to the sacrum. Each of these is approximately 3 cm deep with a communication between the 2. The ulcers do not extend to the bony sacrum or to the paraspinal musculature. There are no abscesses. No osteomyelitis. Note is made of bilateral hip effusions. The bones of the hips appear normal. There is a focal soft disc protrusion to the left of midline at L4-5 which might affect the left L5 nerve. No adenopathy or mass lesions. Foley catheter in the bladder. There is diffuse mucosal thickening in the colon consistent with the patient's history of C difficile colitis. Nonspecific slight presacral edema. IMPRESSION: 1. No evidence of osteomyelitis or soft tissue abscess. 2. 2 decubitus ulcers in  the midline posteriorly superficial to the sacrum. 3. Focal soft disc protrusion to the left of midline at L4-5 which might affect the left L5 nerve. 4. Diffuse mucosal thickening in the colon consistent with the patient's history of C difficile colitis. Electronically Signed   By: Lorriane Shire M.D.   On: 09/29/2019 16:50        Scheduled Meds: . Chlorhexidine Gluconate Cloth  6 each Topical Daily  . feeding supplement (PRO-STAT SUGAR FREE 64)  30 mL Oral BID  . heparin  5,000 Units Subcutaneous Q8H  . vancomycin  125 mg Oral TID AC & HS   Continuous Infusions: . sodium chloride    . ceFEPime (MAXIPIME) IV 2 g (09/30/19 2303)  . vancomycin       LOS: 3 days    Time spent: 35 mins.More than 50% of that time was spent in counseling and/or coordination of care.      Shelly Coss, MD [facility] P2/14/2021, 7:58 AM

## 2019-10-01 NOTE — TOC Initial Note (Signed)
Transition of Care [facility]) - Initial/Assessment Note    Patient Details  Name: Traci [name] MRN: [id number] Date of Birth: 1975-09-23  Transition of Care [facility] CM/SW Contact:    Alberteen Sam, [place] Phone Number: [phone] 10/01/2019, 10:16 AM  Clinical Narrative:                  CSW spoke with patient regarding discharge planning and PT recommendation of SNF. She reports she has been living at [facility] and plans to return at dc, reports no questions or concerns with her dc plan back to [facility] at this time.   Please inform TOC Team once patient is nearing medical readiness, as insurance authorization will need to be initiated.    Expected Discharge Plan: Skilled Nursing Facility Barriers to Discharge: Continued Medical Work up   Patient Goals and CMS Choice Patient states their goals for this hospitalization and ongoing recovery are:: to go back to [employer] Medicare.gov Compare Post Acute Care list provided to:: Patient Choice offered to / list presented to : Patient  Expected Discharge Plan and Services Expected Discharge Plan: [place] Choice: [place] Living arrangements for the past 2 months: Skilled Nursing Facility([facility])                                      Prior Living Arrangements/Services Living arrangements for the past 2 months: Skilled Nursing Facility([facility]) Lives with:: Self Patient language and need for interpreter reviewed:: Yes Do you feel safe going back to the place where you live?: Yes      Need for Family Participation in Patient Care: Yes (Comment) Care giver support system in place?: Yes (comment)   Criminal Activity/Legal Involvement Pertinent to Current Situation/Hospitalization: No - Comment as needed  Activities of Daily Living Home Assistive Devices/Equipment: Eyeglasses, Wheelchair ADL Screening (condition at time of admission) Patient's cognitive  ability adequate to safely complete daily activities?: Yes Is the patient deaf or have difficulty hearing?: No Does the patient have difficulty seeing, even when wearing glasses/contacts?: No Does the patient have difficulty concentrating, remembering, or making decisions?: No Patient able to express need for assistance with ADLs?: Yes Does the patient have difficulty dressing or bathing?: Yes Independently performs ADLs?: No Communication: Independent Dressing (OT): Dependent Is this a change from baseline?: Pre-admission baseline Grooming: Dependent Is this a change from baseline?: Pre-admission baseline Feeding: Independent Bathing: Dependent Is this a change from baseline?: Pre-admission baseline Toileting: Dependent Is this a change from baseline?: Pre-admission baseline In/Out Bed: Dependent Is this a change from baseline?: Pre-admission baseline Walks in Home: Dependent Is this a change from baseline?: Pre-admission baseline Does the patient have difficulty walking or climbing stairs?: Yes Weakness of Legs: Both Weakness of Arms/Hands: None  Permission Sought/Granted Permission sought to share information with : Case Freight forwarder, Investment banker, corporate granted to share info w AGENCY: SNF        Emotional Assessment Appearance:: Appears stated age Attitude/Demeanor/Rapport: Gracious Affect (typically observed): Calm Orientation: : Oriented to Self, Oriented to Place, Oriented to  Time, Oriented to Situation Alcohol / Substance Use: Not Applicable Psych Involvement: No (comment)  Admission diagnosis:  AKI (acute kidney injury) ([place]) [N17.9] C. difficile colitis [A04.72] Sepsis ([place]) [A41.9] Urinary tract infection associated with indwelling urethral catheter, initial encounter ([place]) [id number], N39.0] Sepsis with acute renal failure  without septic shock, due to unspecified organism, unspecified acute renal failure type ([place]) [A41.9, R65.20,  N17.9] Patient Active Problem List   Diagnosis Date Noted  . C. difficile diarrhea 09/29/2019  . Sepsis ([place]) 09/28/2019  . Proteus infection   . Fever   . Generalized abdominal pain   . Non-intractable vomiting with nausea   . Hemorrhoids   . Neuropathic pain   . Acute blood loss anemia   . Transaminitis   . Acute lower UTI   . Hyponatremia   . Peroneal DVT (deep venous thrombosis), left ([place])   . Thoracic myelopathy   . Neurogenic bladder   . Neurogenic bowel   . Bilateral leg weakness   . CNS demyelination ([place])   . Myelitis ([place])   . Paresthesia of both lower extremities   . Morbid obesity ([place])   . Spinal stenosis of lumbar region   . DDD (degenerative disc disease), lumbosacral   . Diabetes mellitus type 2 in obese ([place])   . Paraplegia ([place])   . Transverse myelitis ([place]) 12/25/2016  . Steroid-induced hyperglycemia 12/25/2016   PCP:  Seward Carol, MD Pharmacy:  No Pharmacies Listed    Social Determinants of Health (SDOH) Interventions    Readmission Risk Interventions No flowsheet data found.

## 2019-10-02 DIAGNOSIS — L8990 Pressure ulcer of unspecified site, unspecified stage: Secondary | ICD-10-CM | POA: Insufficient documentation

## 2019-10-02 LAB — CBC WITH DIFFERENTIAL/PLATELET
Abs Immature Granulocytes: 2.85 10*3/uL — ABNORMAL HIGH (ref 0.00–0.07)
Basophils Absolute: 0.1 10*3/uL (ref 0.0–0.1)
Basophils Relative: 1 %
Eosinophils Absolute: 0.9 10*3/uL — ABNORMAL HIGH (ref 0.0–0.5)
Eosinophils Relative: 4 %
HCT: 24.5 % — ABNORMAL LOW (ref 36.0–46.0)
Hemoglobin: 7.5 g/dL — ABNORMAL LOW (ref 12.0–15.0)
Immature Granulocytes: 13 %
Lymphocytes Relative: 21 %
Lymphs Abs: 4.4 10*3/uL — ABNORMAL HIGH (ref 0.7–4.0)
MCH: 22.1 pg — ABNORMAL LOW (ref 26.0–34.0)
MCHC: 30.6 g/dL (ref 30.0–36.0)
MCV: 72.3 fL — ABNORMAL LOW (ref 80.0–100.0)
Monocytes Absolute: 1.3 10*3/uL — ABNORMAL HIGH (ref 0.1–1.0)
Monocytes Relative: 6 %
Neutro Abs: 11.7 10*3/uL — ABNORMAL HIGH (ref 1.7–7.7)
Neutrophils Relative %: 55 %
Platelets: 592 10*3/uL — ABNORMAL HIGH (ref 150–400)
RBC: 3.39 MIL/uL — ABNORMAL LOW (ref 3.87–5.11)
RDW: 19 % — ABNORMAL HIGH (ref 11.5–15.5)
WBC: 21.3 10*3/uL — ABNORMAL HIGH (ref 4.0–10.5)
nRBC: 0.2 % (ref 0.0–0.2)

## 2019-10-02 LAB — BASIC METABOLIC PANEL
Anion gap: 11 (ref 5–15)
BUN: 43 mg/dL — ABNORMAL HIGH (ref 6–20)
CO2: 22 mmol/L (ref 22–32)
Calcium: 8 mg/dL — ABNORMAL LOW (ref 8.9–10.3)
Chloride: 107 mmol/L (ref 98–111)
Creatinine, Ser: 2.2 mg/dL — ABNORMAL HIGH (ref 0.44–1.00)
GFR calc Af Amer: 30 mL/min — ABNORMAL LOW (ref >60)
GFR calc non Af Amer: 26 mL/min — ABNORMAL LOW (ref >60)
Glucose, Bld: 129 mg/dL — ABNORMAL HIGH (ref 70–99)
Potassium: 3.4 mmol/L — ABNORMAL LOW (ref 3.5–5.1)
Sodium: 140 mmol/L (ref 135–145)

## 2019-10-02 MED ORDER — POTASSIUM CHLORIDE CRYS ER 20 MEQ PO TBCR
40.0000 meq | EXTENDED_RELEASE_TABLET | Freq: Once | ORAL | Status: AC
  Administered 2019-10-02: 40 meq via ORAL
  Filled 2019-10-02: qty 2

## 2019-10-02 MED ORDER — SODIUM CHLORIDE 0.9 % IV SOLN
510.0000 mg | Freq: Once | INTRAVENOUS | Status: AC
  Administered 2019-10-02: 510 mg via INTRAVENOUS
  Filled 2019-10-02: qty 17

## 2019-10-02 MED ORDER — HYDROCODONE-ACETAMINOPHEN 7.5-325 MG PO TABS: 1.0000 | ORAL_TABLET | Freq: Four times a day (QID) | ORAL | Status: DC | PRN

## 2019-10-02 NOTE — Progress Notes (Addendum)
PROGRESS NOTE    Traci [name]  [id number] DOB: 1975-11-22 DOA: 09/28/2019 PCP: Seward Carol, MD   Brief Narrative: Patient is a 44 year old female with history of CKD stage IV, diabetes type 2, transverse myelitis/paraplegia/neurogenic bladder, recent history of C. difficile colitis on oral vancomycin since 09/26/2019 who presents from [facility] with complaints of bilateral lower abdominal discomfort.  She was diagnosed with Covid in mid January and she has recovered.  She is paraplegic at baseline.She was having diarrhea.  CT imaging done here showed segmental areas of colonic wall thickening consistent with colitis, moderate fecal retention, and non obstructing right renal calculi.  Patient was febrile, tachycardic, hypotensive and had leukocytosis on presentation.  Admitted for management of sepsis.  Assessment & Plan:   Principal Problem:   Sepsis ([place]) Active Problems:   Transverse myelitis ([place])   Diabetes mellitus type 2 in obese ([facility])   Acute lower UTI   C. difficile diarrhea   Sepsis: Present on admission.  Was febrile, tachycardic, hypotensive.  Has severe  leukocytosis. Elevated procalcitonin. Blood cultures have been sent. NGTD. Continue IV cefepime.Will dc IV vancomycin.  Source cud be infected sacral ulcer or from C. Difficile itself.   Currently she is afebrile.We will plan for de-escalation of antibiotics but will continue current regimen because she still has severe leukocytosis.    Recurrent  C. difficile colitis: Was on oral vancomycin which was started on 09/26/2019.  She was having diarrhea.This is her second episode of C. difficile colitis CT imaging showed colitis.  She was complaining of abdominal pain in the nursing facilty.  Continue oral vancomycin.  Patient had been having large loose  bowel movements in the emergency department.Diarrhoea  have slowed down but has not stopped. We will continue oral vancomycin till seven more days after stoppage of  other antibiotics.  Hypertension: Currently blood pressure soft.  Antihypertensives on hold  Suspected UTI: Urine culture showed multiple species.  Continue current antibiotics.  She has a chronic indwelling Foley catheter.Urine looked turbid on presentation.  AKI on CKD stage IV: Her baseline creatinine ranges from 2.5-2.8.  Might have mild AKI on presentation.  Had metabolic acidosis with CO2 of just 13. She was on  bicarb drip .Kidney function improving and currently at baseline.  Fluids changed to normal saline.  Sinus tachycardia: Could be associated with hypovolemia from diarrhea or pain.  Continue to monitor.  Severe protein calorie malnutrition: Dietitian consulted.  Chronic iron deficiency anemia:   She has chronic iron deficiency and follows with hematology as an outpatient.Iron studies showed low iron.  She will be given a dose of IV iron.She should continue iron supplementation on discharge.  Diabetes type 2: Continue sliding scale insulin.Monitor CBGs  Stage III -IV sacral ulcer on the perianal area: Present on admission.; wound care consulted.  General surgery consulted and they did bedside debridement.  No need of extensive debridement at present.   MRI of the sacrum ruled out osteomyelitis.  Continue wound care         DVT prophylaxis:Heparin Benjamin Perez Code Status: Full Family Communication: None present at the bedside Disposition Plan: Patient is from skilled nursing facility.  She is not clinically ready for discharge because of worsening leukocytosis, persistent sinus tachycardia. she will be discharged to skilled nursing facility when medically stable.   Consultants: general surgery  Procedures: Bedside sacral wound debridement  Antimicrobials:  Anti-infectives (From admission, onward)   Start     Dose/Rate Route Frequency Ordered Stop   10/01/19  1200  vancomycin (VANCOREADY) IVPB 1250 mg/250 mL     1,250 mg 166.7 mL/hr over 90 Minutes Intravenous Every 48 hours  09/30/19 0752     09/30/19 0600  vancomycin (VANCOREADY) IVPB 500 mg/100 mL  Status:  Discontinued     500 mg 100 mL/hr over 60 Minutes Intravenous Every 24 hours 09/28/19 2142 09/30/19 0752   09/29/19 2200  ceFEPIme (MAXIPIME) 2 g in sodium chloride 0.9 % 100 mL IVPB     2 g 200 mL/hr over 30 Minutes Intravenous Every 24 hours 09/28/19 2142     09/29/19 0600  metroNIDAZOLE (FLAGYL) IVPB 500 mg  Status:  Discontinued     500 mg 100 mL/hr over 60 Minutes Intravenous Every 8 hours 09/29/19 0112 09/29/19 1236   09/29/19 0200  vancomycin (VANCOCIN) 50 mg/mL oral solution 125 mg     125 mg Oral 3 times daily before meals & bedtime 09/29/19 0112     09/28/19 2030  ceFEPIme (MAXIPIME) 2 g in sodium chloride 0.9 % 100 mL IVPB     2 g 200 mL/hr over 30 Minutes Intravenous  Once 09/28/19 2017 09/28/19 2126   09/28/19 2030  metroNIDAZOLE (FLAGYL) IVPB 500 mg     500 mg 100 mL/hr over 60 Minutes Intravenous  Once 09/28/19 2017 09/28/19 2235   09/28/19 2030  vancomycin (VANCOCIN) IVPB 1000 mg/200 mL premix  Status:  Discontinued     1,000 mg 200 mL/hr over 60 Minutes Intravenous  Once 09/28/19 2017 09/28/19 2020   09/28/19 2030  Vancomycin (VANCOCIN) 1,250 mg in sodium chloride 0.9 % 250 mL IVPB     1,250 mg 166.7 mL/hr over 90 Minutes Intravenous  Once 09/28/19 2020 09/29/19 0040      Subjective:  Patient seen and examined the bedside this morning.  Blood pressure has improved.  Still in sinus tachycardia.  She  was still having diarrhea but not extensive like before.  Denies any abdomen pain.  Complains that her bed is very uncomfortable.  Objective: Vitals:   10/01/19 2100 10/01/19 2358 10/02/19 0409 10/02/19 0559  BP: 115/61 (!) 98/53 96/68   Pulse: (!) 102 (!) 109 (!) 106   Resp: 18 20 20    Temp: 98.1 F (36.7 C) 99.8 F (37.7 C) 98.3 F (36.8 C)   TempSrc: Oral Oral Oral   SpO2: 100% 99% 100%   Weight:    (!) 139 kg  Height:        Intake/Output Summary (Last 24 hours) at  10/02/2019 0754 Last data filed at 10/01/2019 1906 Gross per 24 hour  Intake 996.25 ml  Output 1350 ml  Net -353.75 ml   Filed Weights   09/28/19 2129 10/01/19 0612 10/02/19 0559  Weight: 127 kg (!) 139 kg (!) 139 kg    Examination:  General exam: Not in distress, morbidly obese HEENT:PERRL,Oral mucosa moist, Ear/Nose normal on gross exam Respiratory system: Bilateral equal air entry, normal vesicular breath sounds, no wheezes or crackles  Cardiovascular system: Sinus tachycardia. No JVD, murmurs, rubs, gallops or clicks. Gastrointestinal system: Abdomen is nondistended, soft and nontender. No organomegaly or masses felt,paraplegia Central nervous system: Alert and oriented. No focal neurological deficits. Extremities: No edema, no clubbing ,no cyanosis Skin: 2 stage 3 sacral ulcers.  Present on admission.   Data Reviewed: I have personally reviewed following labs and imaging studies  CBC: Recent Labs  Lab 09/28/19 2026 09/28/19 2026 09/28/19 2050 09/29/19 0356 09/30/19 0336 10/01/19 0306 10/02/19 0314  WBC 15.8*  --   --  17.4* 19.0* 21.2* 21.3*  NEUTROABS 9.3*  --   --   --  9.8* 11.2* 11.7*  HGB 8.7*   < > 9.2* 8.0* 7.8* 7.8* 7.5*  HCT 28.2*   < > 27.0* 25.8* 24.5* 25.5* 24.5*  MCV 74.0*  --   --  73.9* 72.5* 73.9* 72.3*  PLT 487*  --   --  474* 467* 596* 592*   < > = values in this interval not displayed.   Basic Metabolic Panel: Recent Labs  Lab 09/28/19 2026 09/28/19 2026 09/28/19 2050 09/29/19 0356 09/30/19 0336 10/01/19 0306 10/02/19 0314  NA 133*   < > 131* 136 136 136 140  K 3.8   < > 3.8 3.8 3.1* 3.6 3.4*  CL 106   < > 105 111 108 102 107  CO2 15*  --   --  13* 17* 22 22  GLUCOSE 154*   < > 150* 145* 128* 158* 129*  BUN 41*   < > 41* 42* 45* 47* 43*  CREATININE 3.02*   < > 3.10* 2.86* 2.61* 2.57* 2.20*  CALCIUM 8.3*  --   --  7.9* 7.9* 7.7* 8.0*   < > = values in this interval not displayed.   GFR: Estimated Creatinine Clearance: 42.3 mL/min  (A) (by C-G formula based on SCr of 2.2 mg/dL (H)). Liver Function Tests: Recent Labs  Lab 09/28/19 2026 09/29/19 0356  AST 19 10*  ALT 14 11  ALKPHOS 63 61  BILITOT 0.6 0.7  PROT 6.1* 5.8*  ALBUMIN 2.0* 1.8*   No results for input(s): LIPASE, AMYLASE in the last 168 hours. No results for input(s): AMMONIA in the last 168 hours. Coagulation Profile: Recent Labs  Lab 09/28/19 2026  INR 1.2   Cardiac Enzymes: No results for input(s): CKTOTAL, CKMB, CKMBINDEX, TROPONINI in the last 168 hours. BNP (last 3 results) No results for input(s): PROBNP in the last 8760 hours. HbA1C: No results for input(s): HGBA1C in the last 72 hours. CBG: No results for input(s): GLUCAP in the last 168 hours. Lipid Profile: No results for input(s): CHOL, HDL, LDLCALC, TRIG, CHOLHDL, LDLDIRECT in the last 72 hours. Thyroid Function Tests: No results for input(s): TSH, T4TOTAL, FREET4, T3FREE, THYROIDAB in the last 72 hours. Anemia Panel: Recent Labs    09/29/19 1234  FERRITIN 416*  TIBC 134*  IRON 16*   Sepsis Labs: Recent Labs  Lab 09/28/19 2026 10/01/19 0306  PROCALCITON  --  >150.00  LATICACIDVEN 1.6  --     Recent Results (from the past 240 hour(s))  SARS CORONAVIRUS 2 (TAT 6-24 HRS) Nasopharyngeal Nasopharyngeal Swab     Status: None   Collection Time: 09/28/19  1:05 AM   Specimen: Nasopharyngeal Swab  Result Value Ref Range Status   SARS Coronavirus 2 NEGATIVE NEGATIVE Final    Comment: (NOTE) SARS-CoV-2 target nucleic acids are NOT DETECTED. The SARS-CoV-2 RNA is generally detectable in upper and lower respiratory specimens during the acute phase of infection. Negative results do not preclude SARS-CoV-2 infection, do not rule out co-infections with other pathogens, and should not be used as the sole basis for treatment or other patient management decisions. Negative results must be combined with clinical observations, patient history, and epidemiological information. The  expected result is Negative. Fact Sheet for Patients: SugarRoll.be Fact Sheet for Healthcare Providers: https://www.woods-mathews.com/ This test is not yet approved or cleared by the [place] FDA and  has been authorized for detection and/or diagnosis of SARS-CoV-2 by FDA  under an Emergency Use Authorization (EUA). This EUA will remain  in effect (meaning this test can be used) for the duration of the COVID-19 declaration under Section 56 4(b)(1) of the Act, 21 U.S.C. section 360bbb-3(b)(1), unless the authorization is terminated or revoked sooner. Performed at [facility], [place] [address] [place], [place] [address]   Blood Culture (routine x 2)     Status: None (Preliminary result)   Collection Time: 09/28/19  8:26 PM   Specimen: BLOOD  Result Value Ref Range Status   Specimen Description BLOOD RIGHT ARM  Final   Special Requests   Final    BOTTLES DRAWN AEROBIC ONLY Blood Culture results may not be optimal due to an inadequate volume of blood received in culture bottles   Culture   Final    NO GROWTH 4 DAYS Performed at [facility], [place] [address], [place], [place] [address]    Report Status PENDING  Incomplete  Urine culture     Status: Abnormal   Collection Time: 09/28/19  9:18 PM   Specimen: In/Out Cath Urine  Result Value Ref Range Status   Specimen Description IN/OUT CATH URINE  Final   Special Requests   Final    NONE Performed at [facility], [place] [address], [place], [place] [address]    Culture MULTIPLE SPECIES PRESENT, SUGGEST RECOLLECTION (A)  Final   Report Status 09/29/2019 FINAL  Final  Blood Culture (routine x 2)     Status: None (Preliminary result)   Collection Time: 09/28/19  9:45 PM   Specimen: BLOOD  Result Value Ref Range Status   Specimen Description BLOOD LEFT ANTECUBITAL  Final   Special Requests   Final    BOTTLES DRAWN AEROBIC AND ANAEROBIC Blood Culture results may not be optimal  due to an inadequate volume of blood received in culture bottles   Culture   Final    NO GROWTH 4 DAYS Performed at [facility], [place] [address], [place], [place] [address]    Report Status PENDING  Incomplete  Respiratory Panel by RT PCR (Flu A&B, Covid) - Nasopharyngeal Swab     Status: None   Collection Time: 09/29/19  1:29 AM   Specimen: Nasopharyngeal Swab  Result Value Ref Range Status   SARS Coronavirus 2 by RT PCR NEGATIVE NEGATIVE Final    Comment: (NOTE) SARS-CoV-2 target nucleic acids are NOT DETECTED. The SARS-CoV-2 RNA is generally detectable in upper respiratoy specimens during the acute phase of infection. The lowest concentration of SARS-CoV-2 viral copies this assay can detect is 131 copies/mL. A negative result does not preclude SARS-Cov-2 infection and should not be used as the sole basis for treatment or other patient management decisions. A negative result may occur with  improper specimen collection/handling, submission of specimen other than nasopharyngeal swab, presence of viral mutation(s) within the areas targeted by this assay, and inadequate number of viral copies (<131 copies/mL). A negative result must be combined with clinical observations, patient history, and epidemiological information. The expected result is Negative. Fact Sheet for Patients:  PinkCheek.be Fact Sheet for Healthcare Providers:  GravelBags.it This test is not yet ap proved or cleared by the [place] FDA and  has been authorized for detection and/or diagnosis of SARS-CoV-2 by FDA under an Emergency Use Authorization (EUA). This EUA will remain  in effect (meaning this test can be used) for the duration of the COVID-19 declaration under Section 564(b)(1) of the Act, 21 U.S.C.  section 360bbb-3(b)(1), unless the authorization is terminated or revoked sooner.    Influenza A by PCR NEGATIVE NEGATIVE Final   Influenza B  by PCR NEGATIVE NEGATIVE Final    Comment: (NOTE) The Xpert Xpress SARS-CoV-2/FLU/RSV assay is intended as an aid in  the diagnosis of influenza from Nasopharyngeal swab specimens and  should not be used as a sole basis for treatment. Nasal washings and  aspirates are unacceptable for Xpert Xpress SARS-CoV-2/FLU/RSV  testing. Fact Sheet for Patients: PinkCheek.be Fact Sheet for Healthcare Providers: GravelBags.it This test is not yet approved or cleared by the [place] FDA and  has been authorized for detection and/or diagnosis of SARS-CoV-2 by  FDA under an Emergency Use Authorization (EUA). This EUA will remain  in effect (meaning this test can be used) for the duration of the  Covid-19 declaration under Section 564(b)(1) of the Act, 21  U.S.C. section 360bbb-3(b)(1), unless the authorization is  terminated or revoked. Performed at [facility], [place] [address] [place], [place] [address]   MRSA PCR Screening     Status: None   Collection Time: 09/29/19  4:47 AM   Specimen: Nasal Mucosa; Nasopharyngeal  Result Value Ref Range Status   MRSA by PCR NEGATIVE NEGATIVE Final    Comment:        The GeneXpert MRSA Assay (FDA approved for NASAL specimens only), is one component of a comprehensive MRSA colonization surveillance program. It is not intended to diagnose MRSA infection nor to guide or monitor treatment for MRSA infections. Performed at [facility], [place] [address], [place], [place] [address]          Radiology Studies: No results found.      Scheduled Meds: . Chlorhexidine Gluconate Cloth  6 each Topical Daily  . feeding supplement (PRO-STAT SUGAR FREE 64)  30 mL Oral BID  . heparin  5,000 Units Subcutaneous Q8H  . potassium chloride  40 mEq Oral Once  . vancomycin  125 mg Oral TID AC & HS   Continuous Infusions: . sodium chloride 75 mL/hr at 10/01/19 2257  . ceFEPime (MAXIPIME)  IV 2 g (10/01/19 2300)  . vancomycin 1,250 mg (10/01/19 1238)     LOS: 4 days    Time spent: 35 mins.More than 50% of that time was spent in counseling and/or coordination of care.      Shelly Coss, MD [facility] P2/15/2021, 7:54 AM

## 2019-10-02 NOTE — Progress Notes (Signed)
Physical Therapy Treatment Patient Details Name: Traci [name] MRN: [id number] DOB: [birth year] Today's Date: 10/02/2019    History of Present Illness Patient is a 44 year old female with history of CKD stage IV, diabetes type 2, transverse myelitis/paraplegia/neurogenic bladder, recent history of C. difficile colitis on oral vancomycin since 09/26/2019, recent Covid infection in mid January, who presents from [facility] with complaints of bilateral lower abdominal discomfort and diarrhea. Admitted for management of sepsis.     PT Comments    Patient agreeable to therapy, however still having frequent diarrhea and deferred OOB to chair. Focused on education re: pressure relief and attempted to reposition patient's bed to more chair-like position and to add more air to mattress with both pieces of equipment not working. Notified RN and Biomedical engineer to call for a bed check or new bed. Assisted pt with pressure relief by having pt roll onto her rt side and placed a pillow under her left hip/buttock for partial turn. Issued theraband for UE exercises.     Follow Up Recommendations  SNF     Equipment Recommendations  None recommended by PT    Recommendations for Other Services       Precautions / Restrictions Precautions Precautions: Fall;Other (comment) Precaution Comments: Paraplegic Restrictions Weight Bearing Restrictions: No    Mobility  Bed Mobility Overal bed mobility: Needs Assistance Bed Mobility: Rolling Rolling: Modified independent (Device/Increase time)(with rail)         General bed mobility comments: Pt able to use momentum to roll onto rt side with rail  Transfers                 General transfer comment: deferred due to bowel incontinence  Ambulation/Gait                 Stairs             Wheelchair Mobility    Modified Rankin (Stroke Patients Only)       Balance                                             Cognition Arousal/Alertness: Awake/alert Behavior During Therapy: WFL for tasks assessed/performed Overall Cognitive Status: Within Functional Limits for tasks assessed                                        Exercises Other Exercises Other Exercises: issued 2 green therabands ~30" long; educated to focus on triceps and scapular protraction (did not have room in bed to work on scapular depression with resistance.    General Comments General comments (skin integrity, edema, etc.): Educated pt on positioning on air mattress to minimize pressure on her sacrum (pt in Fowler's position, and bottoming-out mattress to nearly rest on bedframe). Attempted to adjust with reverse trendelenburg to more  upright position, however bed not responding to tilt in either direction. Attempted to add more air to mattress, and mattress controls also not working. RN reports she asked secretary to call this morning, I again asked her to call as no one had come.       Pertinent Vitals/Pain Pain Assessment: No/denies pain    Home Living  Prior Function            PT Goals (current goals can now be found in the care plan section) Acute Rehab PT Goals Patient Stated Goal: "no more C diff." Time For Goal Achievement: 10/14/19 Potential to Achieve Goals: Fair Progress towards PT goals: Not progressing toward goals - comment(continued diarrhea)    Frequency    Min 2X/week      PT Plan Current plan remains appropriate    Co-evaluation              AM-PAC PT "6 Clicks" Mobility   Outcome Measure  Help needed turning from your back to your side while in a flat bed without using bedrails?: A Little Help needed moving from lying on your back to sitting on the side of a flat bed without using bedrails?: A Lot Help needed moving to and from a bed to a chair (including a wheelchair)?: A Lot Help needed standing up from a chair using your  arms (e.g., wheelchair or bedside chair)?: Total Help needed to walk in hospital room?: Total Help needed climbing 3-5 steps with a railing? : Total 6 Click Score: 10    End of Session   Activity Tolerance: Patient tolerated treatment well Patient left: in bed;with call bell/phone within reach Nurse Communication: Other (comment)(bed issues) PT Visit Diagnosis: Other abnormalities of gait and mobility (R26.89)     Time: 1410-1438 PT Time Calculation (min) (ACUTE ONLY): 28 min  Charges:  $Therapeutic Exercise: 8-22 mins $Self Care/Home Management: 8-22                      Traci [name], PT Pager [phone]    Traci [name] 10/02/2019, 3:26 PM

## 2019-10-03 LAB — CBC WITH DIFFERENTIAL/PLATELET
Abs Immature Granulocytes: 3.14 10*3/uL — ABNORMAL HIGH (ref 0.00–0.07)
Basophils Absolute: 0.1 10*3/uL (ref 0.0–0.1)
Basophils Relative: 1 %
Eosinophils Absolute: 0.8 10*3/uL — ABNORMAL HIGH (ref 0.0–0.5)
Eosinophils Relative: 4 %
HCT: 24.3 % — ABNORMAL LOW (ref 36.0–46.0)
Hemoglobin: 7.4 g/dL — ABNORMAL LOW (ref 12.0–15.0)
Immature Granulocytes: 16 %
Lymphocytes Relative: 21 %
Lymphs Abs: 4 10*3/uL (ref 0.7–4.0)
MCH: 22.4 pg — ABNORMAL LOW (ref 26.0–34.0)
MCHC: 30.5 g/dL (ref 30.0–36.0)
MCV: 73.6 fL — ABNORMAL LOW (ref 80.0–100.0)
Monocytes Absolute: 1.2 10*3/uL — ABNORMAL HIGH (ref 0.1–1.0)
Monocytes Relative: 6 %
Neutro Abs: 10.2 10*3/uL — ABNORMAL HIGH (ref 1.7–7.7)
Neutrophils Relative %: 52 %
Platelets: 559 10*3/uL — ABNORMAL HIGH (ref 150–400)
RBC: 3.3 MIL/uL — ABNORMAL LOW (ref 3.87–5.11)
RDW: 18.9 % — ABNORMAL HIGH (ref 11.5–15.5)
WBC: 19.4 10*3/uL — ABNORMAL HIGH (ref 4.0–10.5)
nRBC: 0.3 % — ABNORMAL HIGH (ref 0.0–0.2)

## 2019-10-03 LAB — CULTURE, BLOOD (ROUTINE X 2)
Culture: NO GROWTH
Culture: NO GROWTH

## 2019-10-03 LAB — BASIC METABOLIC PANEL
Anion gap: 10 (ref 5–15)
BUN: 40 mg/dL — ABNORMAL HIGH (ref 6–20)
CO2: 21 mmol/L — ABNORMAL LOW (ref 22–32)
Calcium: 8.1 mg/dL — ABNORMAL LOW (ref 8.9–10.3)
Chloride: 107 mmol/L (ref 98–111)
Creatinine, Ser: 2.01 mg/dL — ABNORMAL HIGH (ref 0.44–1.00)
GFR calc Af Amer: 34 mL/min — ABNORMAL LOW (ref >60)
GFR calc non Af Amer: 29 mL/min — ABNORMAL LOW (ref >60)
Glucose, Bld: 122 mg/dL — ABNORMAL HIGH (ref 70–99)
Potassium: 3.6 mmol/L (ref 3.5–5.1)
Sodium: 138 mmol/L (ref 135–145)

## 2019-10-03 LAB — TSH: TSH: 1.572 u[IU]/mL (ref 0.350–4.500)

## 2019-10-03 MED ORDER — SODIUM BICARBONATE 650 MG PO TABS: 650.0000 mg | ORAL_TABLET | Freq: Two times a day (BID) | ORAL | 1 refills | Status: AC

## 2019-10-03 MED ORDER — VANCOMYCIN 50 MG/ML ORAL SOLUTION: 125.0000 mg | Freq: Three times a day (TID) | ORAL | 0 refills | Status: AC

## 2019-10-03 MED ORDER — TRAMADOL HCL 50 MG PO TABS: 50.0000 mg | ORAL_TABLET | ORAL | 0 refills | Status: AC | PRN

## 2019-10-03 MED ORDER — AMOXICILLIN-POT CLAVULANATE 875-125 MG PO TABS: 1.0000 | ORAL_TABLET | Freq: Two times a day (BID) | ORAL | 0 refills | Status: AC

## 2019-10-03 NOTE — Progress Notes (Signed)
Nsg Discharge Note  Admit Date:  09/28/2019 Discharge date: 10/03/2019   Sanaiah Straube to be D/C'd [facility] via ptar per MD order.  AVS completed. Patient/caregiver able to verbalize understanding.  Discharge Medication: Allergies as of 10/03/2019   No Known Allergies     Medication List    STOP taking these medications   vancomycin 125 MG capsule Commonly known as: VANCOCIN     TAKE these medications   acetaminophen 325 MG tablet Commonly known as: TYLENOL Take 650 mg by mouth every 6 (six) hours as needed for mild pain.   albuterol 108 (90 Base) MCG/ACT inhaler Commonly known as: VENTOLIN HFA Inhale 2 puffs into the lungs every 6 (six) hours as needed for wheezing or shortness of breath.   amoxicillin-clavulanate 875-125 MG tablet Commonly known as: Augmentin Take 1 tablet by mouth 2 (two) times daily for 2 days.   ascorbic acid 500 MG tablet Commonly known as: VITAMIN C Take 500 mg by mouth 2 (two) times daily.   Chloraseptic 6-10 MG lozenge Generic drug: benzocaine-menthol Take 1 lozenge by mouth every 4 (four) hours as needed for sore throat.   Cranberry 250 MG Caps Take 1,000 mg by mouth 2 (two) times daily.   Dermacloud Crea Apply 1 application topically 2 (two) times daily as needed (irritation). Apply to buttocks as needed for protection   diphenhydrAMINE 25 mg capsule Commonly known as: BENADRYL Take 25 mg by mouth every 6 (six) hours as needed for itching.   guaiFENesin 600 MG 12 hr tablet Commonly known as: MUCINEX Take 600 mg by mouth 2 (two) times daily.   iron polysaccharides 150 MG capsule Commonly known as: NIFEREX Take 150 mg by mouth 2 (two) times daily.   LACTOBACILLUS PO Take 1 capsule by mouth 2 (two) times daily.   loperamide 2 MG capsule Commonly known as: IMODIUM Take 2 mg by mouth every 4 (four) hours as needed for diarrhea or loose stools. Max 8 capsules per 24 hours   loratadine 10 MG dissolvable tablet Commonly known as:  CLARITIN REDITABS Take 10 mg by mouth daily.   Melatonin 3 MG Tabs Take 3 mg by mouth at bedtime.   methocarbamol 500 MG tablet Commonly known as: ROBAXIN Take 500 mg by mouth every 8 (eight) hours as needed for muscle spasms.   pregabalin 150 MG capsule Commonly known as: LYRICA Take 150 mg by mouth in the morning, at noon, and at bedtime.   saccharomyces boulardii 250 MG capsule Commonly known as: FLORASTOR Take 250 mg by mouth 2 (two) times daily.   sodium bicarbonate 650 MG tablet Take 1 tablet (650 mg total) by mouth 2 (two) times daily.   sodium chloride 0.65 % Soln nasal spray Commonly known as: OCEAN Place 1 spray into both nostrils every 2 (two) hours as needed (nasal dryness).   traMADol 50 MG tablet Commonly known as: ULTRAM Take 1 tablet (50 mg total) by mouth every 4 (four) hours as needed for severe pain.   vancomycin 50 mg/mL  oral solution Commonly known as: VANCOCIN Take 2.5 mLs (125 mg total) by mouth 4 (four) times daily -  before meals and at bedtime for 9 days.   Vitamin D 50 MCG (2000 UT) Caps Take 2,000 Units by mouth daily.   cholecalciferol 25 MCG (1000 UNIT) tablet Commonly known as: VITAMIN D Take 1,000 Units by mouth daily.   Vitamin D2 50 MCG (2000 UT) Tabs Take 2,000 Units by mouth daily.   Zinc Gluconate  100 MG Tabs Take 100 mg by mouth daily.       Discharge Assessment: Vitals:   10/03/19 1113 10/03/19 1344  BP: (!) 94/42   Pulse: 97 96  Resp: (!) 21 15  Temp: 98 F (36.7 C) 98.2 F (36.8 C)  SpO2: 99% 99%   Skin clean, dry and intact without evidence of skin break down, no evidence of skin tears noted. IV catheter discontinued intact. Site without signs and symptoms of complications - no redness or edema noted at insertion site, patient denies c/o pain - only slight tenderness at site.  Dressing with slight pressure applied.  D/c Instructions-Education: Discharge instructions given to patient/family with verbalized  understanding. D/c education completed with patient/family including follow up instructions, medication list, d/c activities limitations if indicated, with other d/c instructions as indicated by MD - patient able to verbalize understanding, all questions fully answered. Patient instructed to return to ED, call 911, or call MD for any changes in condition.  Patient escorted via [place], and D/C to [facility] via ptar  Atilano Ina, RN 10/03/2019 1:52 PM

## 2019-10-03 NOTE — TOC Progression Note (Addendum)
Transition of Care [facility]) - Progression Note    Patient Details  Name: Traci [name] MRN: [id number] Date of Birth: [birth year]  Transition of [facility]) CM/SW [place], LCSW Phone Number: 10/03/2019, 10:27 AM  Clinical Narrative:    Patient able to return to [facility] today. [facility] confirmed no new COVID test needed. CSW sent info to insurance for approval.   Update: Insurance approval received.    Expected Discharge Plan: [place] Barriers to Discharge: No Barriers Identified  Expected Discharge Plan and Services Expected Discharge Plan: [place] Choice: [place] Living arrangements for the past 2 months: Skilled Nursing Facility([facility]) Expected Discharge Date: 10/03/19                                     Social Determinants of Health (SDOH) Interventions    Readmission Risk Interventions No flowsheet data found.

## 2019-10-03 NOTE — Discharge Summary (Signed)
Physician Discharge Summary  Traci [name] [id number] DOB: Feb 21, 1976 DOA: 09/28/2019  PCP: Seward Carol, MD  Admit date: 09/28/2019 Discharge date: 10/03/2019  Admitted From: SNF Disposition:  SNF  Discharge Condition:Stable CODE STATUS:FULL Diet recommendation: Heart Healthy   Brief/Interim Summary:  Patient is a 44 year old female with history of CKD stage IV, diabetes type 2, transverse myelitis/paraplegia/neurogenic bladder, recent history of C. difficile colitis on oral vancomycin since 09/26/2019 who presents from [facility] with complaints of bilateral lower abdominal discomfort.  She was diagnosed with Covid in mid January and she has recovered.  She is paraplegic at baseline.She was having diarrhea.  CT imaging done here showed segmental areas of colonic wall thickening consistent with colitis, moderate fecal retention, and non obstructing right renal calculi.  Patient was febrile, tachycardic, hypotensive and had leukocytosis on presentation.  Admitted for management of sepsis.She was admitted for broad spectrum antibiotics. Her cultures were negative.  Currently she is hemodynamically stable, diarrhea has slowed down.  She is medically stable for discharge to skilled facility today.  Following problems were addressed during her hospitalization:  Sepsis: Present on admission.  Was febrile, tachycardic, hypotensive.  Has severe  leukocytosis. Elevated procalcitonin. Blood cultures have been sent. NGTD.   Source cud be infected sacral ulcer or from C. Difficile itself.   Currently she is afebrile.  Antibiotics changed to oral.  She still has significant leukocytosis.  Check CBC in a week.  Recurrent  C. difficile colitis: Was on oral vancomycin which was started on 09/26/2019.  She was having diarrhea.This is her second episode of C. difficile colitis CT imaging showed colitis.  She was complaining of abdominal pain in the nursing facilty.  Continue oral vancomycin for 9  more days. Diarrhoea  have slowed down .  Suspected UTI: Urine culture showed multiple species.  Continue current antibiotics.  She has a chronic indwelling Foley catheter.Urine looked turbid on presentation.  AKI on CKD stage IV: Her baseline creatinine ranges from 2.5-2.8.  Might have mild AKI on presentation.  Had metabolic acidosis with CO2 of just 13. She was on  bicarb drip .Kidney function improving and currently at baseline.  Continue oral bicarb tablets.  Sinus tachycardia: Could be associated with hypovolemia from diarrhea or pain.  Continue to monitor.  Severe protein calorie malnutrition: Dietitian was consulted.  Chronic iron deficiency anemia:   She has chronic iron deficiency and follows with hematology as an outpatient.Iron studies showed low iron.  She was given a dose of IV iron.She should continue iron supplementation on discharge.  Stage III -IV sacral ulcer on the perianal area: Present on admission.; wound care consulted.  General surgery consulted and they did bedside debridement.  No need of extensive debridement at present.   MRI of the sacrum ruled out osteomyelitis.  Continue wound care   Discharge Diagnoses:  Principal Problem:   Sepsis ([place]) Active Problems:   Transverse myelitis ([place])   Diabetes mellitus type 2 in obese ([place])   Acute lower UTI   C. difficile diarrhea   Pressure injury of skin    Discharge Instructions  Discharge Instructions    Diet - low sodium heart healthy   Complete by: As directed    Discharge instructions   Complete by: As directed    1)Do a  CBC and BMP tests in a week. 2)Take prescribed medications as instructed.   Increase activity slowly   Complete by: As directed      Allergies as of 10/03/2019   No  Known Allergies     Medication List    STOP taking these medications   vancomycin 125 MG capsule Commonly known as: VANCOCIN     TAKE these medications   acetaminophen 325 MG tablet Commonly known as:  TYLENOL Take 650 mg by mouth every 6 (six) hours as needed for mild pain.   albuterol 108 (90 Base) MCG/ACT inhaler Commonly known as: VENTOLIN HFA Inhale 2 puffs into the lungs every 6 (six) hours as needed for wheezing or shortness of breath.   amoxicillin-clavulanate 875-125 MG tablet Commonly known as: Augmentin Take 1 tablet by mouth 2 (two) times daily for 2 days.   ascorbic acid 500 MG tablet Commonly known as: VITAMIN C Take 500 mg by mouth 2 (two) times daily.   Chloraseptic 6-10 MG lozenge Generic drug: benzocaine-menthol Take 1 lozenge by mouth every 4 (four) hours as needed for sore throat.   Cranberry 250 MG Caps Take 1,000 mg by mouth 2 (two) times daily.   Dermacloud Crea Apply 1 application topically 2 (two) times daily as needed (irritation). Apply to buttocks as needed for protection   diphenhydrAMINE 25 mg capsule Commonly known as: BENADRYL Take 25 mg by mouth every 6 (six) hours as needed for itching.   guaiFENesin 600 MG 12 hr tablet Commonly known as: MUCINEX Take 600 mg by mouth 2 (two) times daily.   iron polysaccharides 150 MG capsule Commonly known as: NIFEREX Take 150 mg by mouth 2 (two) times daily.   LACTOBACILLUS PO Take 1 capsule by mouth 2 (two) times daily.   loperamide 2 MG capsule Commonly known as: IMODIUM Take 2 mg by mouth every 4 (four) hours as needed for diarrhea or loose stools. Max 8 capsules per 24 hours   loratadine 10 MG dissolvable tablet Commonly known as: CLARITIN REDITABS Take 10 mg by mouth daily.   Melatonin 3 MG Tabs Take 3 mg by mouth at bedtime.   methocarbamol 500 MG tablet Commonly known as: ROBAXIN Take 500 mg by mouth every 8 (eight) hours as needed for muscle spasms.   pregabalin 150 MG capsule Commonly known as: LYRICA Take 150 mg by mouth in the morning, at noon, and at bedtime.   saccharomyces boulardii 250 MG capsule Commonly known as: FLORASTOR Take 250 mg by mouth 2 (two) times daily.    sodium bicarbonate 650 MG tablet Take 1 tablet (650 mg total) by mouth 2 (two) times daily.   sodium chloride 0.65 % Soln nasal spray Commonly known as: OCEAN Place 1 spray into both nostrils every 2 (two) hours as needed (nasal dryness).   traMADol 50 MG tablet Commonly known as: ULTRAM Take 1 tablet (50 mg total) by mouth every 4 (four) hours as needed for severe pain.   vancomycin 50 mg/mL  oral solution Commonly known as: VANCOCIN Take 2.5 mLs (125 mg total) by mouth 4 (four) times daily -  before meals and at bedtime for 9 days.   Vitamin D 50 MCG (2000 UT) Caps Take 2,000 Units by mouth daily.   cholecalciferol 25 MCG (1000 UNIT) tablet Commonly known as: VITAMIN D Take 1,000 Units by mouth daily.   Vitamin D2 50 MCG (2000 UT) Tabs Take 2,000 Units by mouth daily.   Zinc Gluconate 100 MG Tabs Take 100 mg by mouth daily.      Follow-up Information    Seward Carol, MD. Schedule an appointment as soon as possible for a visit in 1 week(s).   Specialty: Internal Medicine Contact  information: [place] [address] [phone]          No Known Allergies  Consultations: General surgery   Procedures/Studies: CT ABDOMEN PELVIS WO CONTRAST  Result Date: 09/28/2019 CLINICAL DATA:  Constipation, fecal impaction, currently being treated for C difficile colitis. EXAM: CT ABDOMEN AND PELVIS WITHOUT CONTRAST TECHNIQUE: Multidetector CT imaging of the abdomen and pelvis was performed following the standard protocol without IV contrast. COMPARISON:  03/20/2019 FINDINGS: Lower chest: No acute pleural or parenchymal lung disease. Hepatobiliary: No focal liver abnormality is seen. No gallstones, gallbladder wall thickening, or biliary dilatation. Pancreas: Unremarkable. No pancreatic ductal dilatation or surrounding inflammatory changes. Spleen: Normal in size without focal abnormality. Adrenals/Urinary Tract: Adrenals are unremarkable. There is bilateral  renal cortical atrophy. Faint 3-4 mm nonobstructing calculi are seen within the right kidney. There is mild right-sided hydronephrosis and hydroureter, though I do not see a clear obstructing calculus or mass. Overall this is improved since previous study. On the left, there is minimal distension of the left renal pelvis and proximal left ureter. No evidence of left-sided calculi. The bladder is decompressed with a Foley catheter. Stomach/Bowel: There is moderate residual stool within the colon, though improved since prior study. Segmental areas of wall thickening are seen involving the cecum and sigmoid colon. Findings are consistent with multifocal colitis. No bowel obstruction. Vascular/Lymphatic: Multiple subcentimeter lymph nodes are seen within the retroperitoneum and surrounding the sigmoid colon, likely inflammatory. Vascular structures are grossly unremarkable. Reproductive: Uterus and bilateral adnexa are unremarkable. Other: There is prominent pericolonic fat stranding involving the rectosigmoid colon. No free fluid within the abdomen or pelvis. No free gas. Musculoskeletal: No acute or destructive bony lesions. Reconstructed images demonstrate no additional findings. IMPRESSION: 1. Segmental areas of colonic wall thickening consistent with colitis. By report, the patient is currently being treated for C difficile colitis. 2. Moderate fecal retention, though decreased stool since prior study. 3. Nonobstructing right renal calculi. There is dilatation of the bilateral renal pelves and ureters, right greater than left, though less pronounced than on prior study. This may be related underlying neurogenic bladder. 4. Pericolonic fat stranding surrounding the sigmoid colon, with multiple small lymph nodes, likely reactive. Electronically Signed   By: Randa Ngo M.D.   On: 09/28/2019 22:21   MR SACRUM SI JOINTS WO CONTRAST  Result Date: 09/29/2019 CLINICAL DATA:  Decubitus ulcer over the sacrum. EXAM:  MRI SACRUM WITHOUT CONTRAST TECHNIQUE: Multiplanar, multisequence MR imaging of the sacrum was performed. No intravenous contrast was administered. COMPARISON:  CT scan of the abdomen and pelvis dated 09/28/2019 FINDINGS: There are 2 decubitus ulcers in the midline posteriorly superficial to the sacrum. Each of these is approximately 3 cm deep with a communication between the 2. The ulcers do not extend to the bony sacrum or to the paraspinal musculature. There are no abscesses. No osteomyelitis. Note is made of bilateral hip effusions. The bones of the hips appear normal. There is a focal soft disc protrusion to the left of midline at L4-5 which might affect the left L5 nerve. No adenopathy or mass lesions. Foley catheter in the bladder. There is diffuse mucosal thickening in the colon consistent with the patient's history of C difficile colitis. Nonspecific slight presacral edema. IMPRESSION: 1. No evidence of osteomyelitis or soft tissue abscess. 2. 2 decubitus ulcers in the midline posteriorly superficial to the sacrum. 3. Focal soft disc protrusion to the left of midline at L4-5 which might affect the left L5 nerve. 4. Diffuse  mucosal thickening in the colon consistent with the patient's history of C difficile colitis. Electronically Signed   By: Lorriane Shire M.D.   On: 09/29/2019 16:50   DG Chest Port 1 View  Result Date: 09/28/2019 CLINICAL DATA:  Sepsis EXAM: PORTABLE CHEST 1 VIEW COMPARISON:  10/13/2016 FINDINGS: The heart size and mediastinal contours are within normal limits. Both lungs are clear. The visualized skeletal structures are unremarkable. IMPRESSION: No active disease. Electronically Signed   By: Ulyses Jarred M.D.   On: 09/28/2019 21:04       Subjective: Patient seen and examined at the bedside this morning.  Hemodynamically stable for discharge.  Discharge Exam: Vitals:   10/03/19 0714 10/03/19 0800  BP: 93/66 106/76  Pulse: (!) 103 (!) 102  Resp: 20 (!) 23  Temp: 98.4 F  (36.9 C) 98.4 F (36.9 C)  SpO2: 96% 95%   Vitals:   10/02/19 2311 10/03/19 0304 10/03/19 0714 10/03/19 0800  BP: (!) 101/49 111/60 93/66 106/76  Pulse: (!) 102  (!) 103 (!) 102  Resp:  (!) 23 20 (!) 23  Temp: 99.5 F (37.5 C) (!) 100.7 F (38.2 C) 98.4 F (36.9 C) 98.4 F (36.9 C)  TempSrc: Oral Oral Oral Oral  SpO2: 96% 97% 96% 95%  Weight:      Height:        General: Pt is alert, awake, not in acute distress Cardiovascular: Sinus tach, no rubs, no gallops Respiratory: CTA bilaterally, no wheezing, no rhonchi Abdominal: Soft, NT, ND, bowel sounds + Extremities: no edema, no cyanosis    The results of significant diagnostics from this hospitalization (including imaging, microbiology, ancillary and laboratory) are listed below for reference.     Microbiology: Recent Results (from the past 240 hour(s))  SARS CORONAVIRUS 2 (TAT 6-24 HRS) Nasopharyngeal Nasopharyngeal Swab     Status: None   Collection Time: 09/28/19  1:05 AM   Specimen: Nasopharyngeal Swab  Result Value Ref Range Status   SARS Coronavirus 2 NEGATIVE NEGATIVE Final    Comment: (NOTE) SARS-CoV-2 target nucleic acids are NOT DETECTED. The SARS-CoV-2 RNA is generally detectable in upper and lower respiratory specimens during the acute phase of infection. Negative results do not preclude SARS-CoV-2 infection, do not rule out co-infections with other pathogens, and should not be used as the sole basis for treatment or other patient management decisions. Negative results must be combined with clinical observations, patient history, and epidemiological information. The expected result is Negative. Fact Sheet for Patients: SugarRoll.be Fact Sheet for Healthcare Providers: https://www.woods-mathews.com/ This test is not yet approved or cleared by the [place] FDA and  has been authorized for detection and/or diagnosis of SARS-CoV-2 by FDA under an Emergency Use  Authorization (EUA). This EUA will remain  in effect (meaning this test can be used) for the duration of the COVID-19 declaration under Section 56 4(b)(1) of the Act, 21 U.S.C. section 360bbb-3(b)(1), unless the authorization is terminated or revoked sooner. Performed at [facility], [place] [address], [place], [place] [address]   Blood Culture (routine x 2)     Status: None   Collection Time: 09/28/19  8:26 PM   Specimen: BLOOD  Result Value Ref Range Status   Specimen Description BLOOD RIGHT ARM  Final   Special Requests   Final    BOTTLES DRAWN AEROBIC ONLY Blood Culture results may not be optimal due to an inadequate volume of blood received in culture bottles   Culture   Final  NO GROWTH 5 DAYS Performed at [facility], [place] [address] [place], [place] [address]    Report Status 10/03/2019 FINAL  Final  Urine culture     Status: Abnormal   Collection Time: 09/28/19  9:18 PM   Specimen: In/Out Cath Urine  Result Value Ref Range Status   Specimen Description IN/OUT CATH URINE  Final   Special Requests   Final    NONE Performed at [facility], [place] [address] [place], [place] [address]    Culture MULTIPLE SPECIES PRESENT, SUGGEST RECOLLECTION (A)  Final   Report Status 09/29/2019 FINAL  Final  Blood Culture (routine x 2)     Status: None   Collection Time: 09/28/19  9:45 PM   Specimen: BLOOD  Result Value Ref Range Status   Specimen Description BLOOD LEFT ANTECUBITAL  Final   Special Requests   Final    BOTTLES DRAWN AEROBIC AND ANAEROBIC Blood Culture results may not be optimal due to an inadequate volume of blood received in culture bottles   Culture   Final    NO GROWTH 5 DAYS Performed at [facility], [place] [address], [place], [place] [address]    Report Status 10/03/2019 FINAL  Final  Respiratory Panel by RT PCR (Flu A&B, Covid) - Nasopharyngeal Swab     Status: None   Collection Time: 09/29/19  1:29 AM   Specimen: Nasopharyngeal Swab   Result Value Ref Range Status   SARS Coronavirus 2 by RT PCR NEGATIVE NEGATIVE Final    Comment: (NOTE) SARS-CoV-2 target nucleic acids are NOT DETECTED. The SARS-CoV-2 RNA is generally detectable in upper respiratoy specimens during the acute phase of infection. The lowest concentration of SARS-CoV-2 viral copies this assay can detect is 131 copies/mL. A negative result does not preclude SARS-Cov-2 infection and should not be used as the sole basis for treatment or other patient management decisions. A negative result may occur with  improper specimen collection/handling, submission of specimen other than nasopharyngeal swab, presence of viral mutation(s) within the areas targeted by this assay, and inadequate number of viral copies (<131 copies/mL). A negative result must be combined with clinical observations, patient history, and epidemiological information. The expected result is Negative. Fact Sheet for Patients:  PinkCheek.be Fact Sheet for Healthcare Providers:  GravelBags.it This test is not yet ap proved or cleared by the [place] FDA and  has been authorized for detection and/or diagnosis of SARS-CoV-2 by FDA under an Emergency Use Authorization (EUA). This EUA will remain  in effect (meaning this test can be used) for the duration of the COVID-19 declaration under Section 564(b)(1) of the Act, 21 U.S.C. section 360bbb-3(b)(1), unless the authorization is terminated or revoked sooner.    Influenza A by PCR NEGATIVE NEGATIVE Final   Influenza B by PCR NEGATIVE NEGATIVE Final    Comment: (NOTE) The Xpert Xpress SARS-CoV-2/FLU/RSV assay is intended as an aid in  the diagnosis of influenza from Nasopharyngeal swab specimens and  should not be used as a sole basis for treatment. Nasal washings and  aspirates are unacceptable for Xpert Xpress SARS-CoV-2/FLU/RSV  testing. Fact Sheet for  Patients: PinkCheek.be Fact Sheet for Healthcare Providers: GravelBags.it This test is not yet approved or cleared by the [place] FDA and  has been authorized for detection and/or diagnosis of SARS-CoV-2 by  FDA under an Emergency Use Authorization (EUA). This EUA will remain  in effect (meaning this test can be used) for the duration of  the  Covid-19 declaration under Section 564(b)(1) of the Act, 21  U.S.C. section 360bbb-3(b)(1), unless the authorization is  terminated or revoked. Performed at [facility], [place] [address] [place], [place] [address]   MRSA PCR Screening     Status: None   Collection Time: 09/29/19  4:47 AM   Specimen: Nasal Mucosa; Nasopharyngeal  Result Value Ref Range Status   MRSA by PCR NEGATIVE NEGATIVE Final    Comment:        The GeneXpert MRSA Assay (FDA approved for NASAL specimens only), is one component of a comprehensive MRSA colonization surveillance program. It is not intended to diagnose MRSA infection nor to guide or monitor treatment for MRSA infections. Performed at [facility], [place] [address], [place], [place] [address]      Labs: BNP (last 3 results) No results for input(s): BNP in the last 8760 hours. Basic Metabolic Panel: Recent Labs  Lab 09/29/19 0356 09/30/19 0336 10/01/19 0306 10/02/19 0314 10/03/19 0156  NA 136 136 136 140 138  K 3.8 3.1* 3.6 3.4* 3.6  CL 111 108 102 107 107  CO2 13* 17* 22 22 21*  GLUCOSE 145* 128* 158* 129* 122*  BUN 42* 45* 47* 43* 40*  CREATININE 2.86* 2.61* 2.57* 2.20* 2.01*  CALCIUM 7.9* 7.9* 7.7* 8.0* 8.1*   Liver Function Tests: Recent Labs  Lab 09/28/19 2026 09/29/19 0356  AST 19 10*  ALT 14 11  ALKPHOS 63 61  BILITOT 0.6 0.7  PROT 6.1* 5.8*  ALBUMIN 2.0* 1.8*   No results for input(s): LIPASE, AMYLASE in the last 168 hours. No results for input(s): AMMONIA in the last 168 hours. CBC: Recent Labs  Lab  09/28/19 2026 09/28/19 2050 09/29/19 0356 09/30/19 0336 10/01/19 0306 10/02/19 0314 10/03/19 0156  WBC 15.8*   < > 17.4* 19.0* 21.2* 21.3* 19.4*  NEUTROABS 9.3*  --   --  9.8* 11.2* 11.7* 10.2*  HGB 8.7*   < > 8.0* 7.8* 7.8* 7.5* 7.4*  HCT 28.2*   < > 25.8* 24.5* 25.5* 24.5* 24.3*  MCV 74.0*   < > 73.9* 72.5* 73.9* 72.3* 73.6*  PLT 487*   < > 474* 467* 596* 592* 559*   < > = values in this interval not displayed.   Cardiac Enzymes: No results for input(s): CKTOTAL, CKMB, CKMBINDEX, TROPONINI in the last 168 hours. BNP: Invalid input(s): POCBNP CBG: No results for input(s): GLUCAP in the last 168 hours. D-Dimer No results for input(s): DDIMER in the last 72 hours. Hgb A1c No results for input(s): HGBA1C in the last 72 hours. Lipid Profile No results for input(s): CHOL, HDL, LDLCALC, TRIG, CHOLHDL, LDLDIRECT in the last 72 hours. Thyroid function studies No results for input(s): TSH, T4TOTAL, T3FREE, THYROIDAB in the last 72 hours.  Invalid input(s): FREET3 Anemia work up No results for input(s): VITAMINB12, FOLATE, FERRITIN, TIBC, IRON, RETICCTPCT in the last 72 hours. Urinalysis    Component Value Date/Time   COLORURINE YELLOW 09/28/2019 2118   APPEARANCEUR CLOUDY (A) 09/28/2019 2118   LABSPEC 1.008 09/28/2019 2118   PHURINE 8.0 09/28/2019 2118   GLUCOSEU NEGATIVE 09/28/2019 2118   HGBUR NEGATIVE 09/28/2019 2118   BILIRUBINUR NEGATIVE 09/28/2019 2118   [place] NEGATIVE 09/28/2019 2118   PROTEINUR 100 (A) 09/28/2019 2118   NITRITE NEGATIVE 09/28/2019 2118   LEUKOCYTESUR MODERATE (A) 09/28/2019 2118   Sepsis Labs Invalid input(s): PROCALCITONIN,  WBC,  LACTICIDVEN Microbiology Recent Results (from the past 240 hour(s))  SARS  CORONAVIRUS 2 (TAT 6-24 HRS) Nasopharyngeal Nasopharyngeal Swab     Status: None   Collection Time: 09/28/19  1:05 AM   Specimen: Nasopharyngeal Swab  Result Value Ref Range Status   SARS Coronavirus 2 NEGATIVE NEGATIVE Final    Comment:  (NOTE) SARS-CoV-2 target nucleic acids are NOT DETECTED. The SARS-CoV-2 RNA is generally detectable in upper and lower respiratory specimens during the acute phase of infection. Negative results do not preclude SARS-CoV-2 infection, do not rule out co-infections with other pathogens, and should not be used as the sole basis for treatment or other patient management decisions. Negative results must be combined with clinical observations, patient history, and epidemiological information. The expected result is Negative. Fact Sheet for Patients: SugarRoll.be Fact Sheet for Healthcare Providers: https://www.woods-mathews.com/ This test is not yet approved or cleared by the [place] FDA and  has been authorized for detection and/or diagnosis of SARS-CoV-2 by FDA under an Emergency Use Authorization (EUA). This EUA will remain  in effect (meaning this test can be used) for the duration of the COVID-19 declaration under Section 56 4(b)(1) of the Act, 21 U.S.C. section 360bbb-3(b)(1), unless the authorization is terminated or revoked sooner. Performed at [facility], [place] [address] [place], [place] [address]   Blood Culture (routine x 2)     Status: None   Collection Time: 09/28/19  8:26 PM   Specimen: BLOOD  Result Value Ref Range Status   Specimen Description BLOOD RIGHT ARM  Final   Special Requests   Final    BOTTLES DRAWN AEROBIC ONLY Blood Culture results may not be optimal due to an inadequate volume of blood received in culture bottles   Culture   Final    NO GROWTH 5 DAYS Performed at [facility], [place] [address] [place], [place] [address]    Report Status 10/03/2019 FINAL  Final  Urine culture     Status: Abnormal   Collection Time: 09/28/19  9:18 PM   Specimen: In/Out Cath Urine  Result Value Ref Range Status   Specimen Description IN/OUT CATH URINE  Final   Special Requests   Final    NONE Performed at [facility], [place] [address], [place], [place] [address]    Culture MULTIPLE SPECIES PRESENT, SUGGEST RECOLLECTION (A)  Final   Report Status 09/29/2019 FINAL  Final  Blood Culture (routine x 2)     Status: None   Collection Time: 09/28/19  9:45 PM   Specimen: BLOOD  Result Value Ref Range Status   Specimen Description BLOOD LEFT ANTECUBITAL  Final   Special Requests   Final    BOTTLES DRAWN AEROBIC AND ANAEROBIC Blood Culture results may not be optimal due to an inadequate volume of blood received in culture bottles   Culture   Final    NO GROWTH 5 DAYS Performed at [facility], [place] [address], [place],  [address]    Report Status 10/03/2019 FINAL  Final  Respiratory Panel by RT PCR (Flu A&B, Covid) - Nasopharyngeal Swab     Status: None   Collection Time: 09/29/19  1:29 AM   Specimen: Nasopharyngeal Swab  Result Value Ref Range Status   SARS Coronavirus 2 by RT PCR NEGATIVE NEGATIVE Final    Comment: (NOTE) SARS-CoV-2 target nucleic acids are NOT DETECTED. The SARS-CoV-2 RNA is generally detectable in upper respiratoy specimens during the acute phase of infection. The lowest concentration of SARS-CoV-2 viral copies this assay can detect  is 131 copies/mL. A negative result does not preclude SARS-Cov-2 infection and should not be used as the sole basis for treatment or other patient management decisions. A negative result may occur with  improper specimen collection/handling, submission of specimen other than nasopharyngeal swab, presence of viral mutation(s) within the areas targeted by this assay, and inadequate number of viral copies (<131 copies/mL). A negative result must be combined with clinical observations, patient history, and epidemiological information. The expected result is Negative. Fact Sheet for Patients:  PinkCheek.be Fact Sheet for Healthcare Providers:  GravelBags.it This test is not yet  ap proved or cleared by the [place] FDA and  has been authorized for detection and/or diagnosis of SARS-CoV-2 by FDA under an Emergency Use Authorization (EUA). This EUA will remain  in effect (meaning this test can be used) for the duration of the COVID-19 declaration under Section 564(b)(1) of the Act, 21 U.S.C. section 360bbb-3(b)(1), unless the authorization is terminated or revoked sooner.    Influenza A by PCR NEGATIVE NEGATIVE Final   Influenza B by PCR NEGATIVE NEGATIVE Final    Comment: (NOTE) The Xpert Xpress SARS-CoV-2/FLU/RSV assay is intended as an aid in  the diagnosis of influenza from Nasopharyngeal swab specimens and  should not be used as a sole basis for treatment. Nasal washings and  aspirates are unacceptable for Xpert Xpress SARS-CoV-2/FLU/RSV  testing. Fact Sheet for Patients: PinkCheek.be Fact Sheet for Healthcare Providers: GravelBags.it This test is not yet approved or cleared by the [place] FDA and  has been authorized for detection and/or diagnosis of SARS-CoV-2 by  FDA under an Emergency Use Authorization (EUA). This EUA will remain  in effect (meaning this test can be used) for the duration of the  Covid-19 declaration under Section 564(b)(1) of the Act, 21  U.S.C. section 360bbb-3(b)(1), unless the authorization is  terminated or revoked. Performed at [facility], [place] [address], [place], [place] [address]   MRSA PCR Screening     Status: None   Collection Time: 09/29/19  4:47 AM   Specimen: Nasal Mucosa; Nasopharyngeal  Result Value Ref Range Status   MRSA by PCR NEGATIVE NEGATIVE Final    Comment:        The GeneXpert MRSA Assay (FDA approved for NASAL specimens only), is one component of a comprehensive MRSA colonization surveillance program. It is not intended to diagnose MRSA infection nor to guide or monitor treatment for MRSA infections. Performed at [facility], [place] [address], [place], [place] [address]     Please note: You were cared for by a hospitalist during your hospital stay. Once you are discharged, your primary care physician will handle any further medical issues. Please note that NO REFILLS for any discharge medications will be authorized once you are discharged, as it is imperative that you return to your primary care physician (or establish a relationship with a primary care physician if you do not have one) for your post hospital discharge needs so that they can reassess your need for medications and monitor your lab values.    Time coordinating discharge: 40 minutes  SIGNED:   Shelly Coss, MD  [facility] 10/03/2019, 10:26 AM Pager [phone]  If 7PM-7AM, please contact night-coverage [email or web address] Password [email or web address]

## 2019-10-03 NOTE — TOC Transition Note (Signed)
Transition of Care [facility]) - CM/SW Discharge Note   Patient Details  Name: Traci [name] MRN: [id number] Date of Birth: 1975-10-12  Transition of [facility]) CM/SW Contact:  Benard Halsted, LCSW Phone Number: 10/03/2019, 12:03 PM   Clinical Narrative:    Patient will DC to: [facility] Anticipated DC date: 10/03/19 Family notified: Pt declined Transport by: Corey Harold 1:30pm   Per MD patient ready for DC to [facility]. RN, patient, patient's family, and facility notified of DC. Discharge Summary and FL2 sent to facility. RN to call report prior to discharge [phone] Room 3225). DC packet on chart. Ambulance transport requested for patient.   CSW will sign off for now as social work intervention is no longer needed. Please consult [place] again if new needs arise.  Cedric Fishman, LCSW Clinical Social Worker [phone]    Final next level of care: Skilled Nursing Facility Barriers to Discharge: No Barriers Identified   Patient Goals and CMS Choice Patient states their goals for this hospitalization and ongoing recovery are:: to go back to [facility] CMS Medicare.gov Compare Post Acute Care list provided to:: Patient Choice offered to / list presented to : Patient  Discharge Placement   Existing PASRR number confirmed : 10/03/19          Patient chooses bed at: [facility] Patient to be transferred to facility by: [facility]   Patient and family notified of of transfer: 10/03/19  Discharge Plan and Services     Post Acute Care Choice: [facility]                               Social Determinants of Health (SDOH) Interventions     Readmission Risk Interventions No flowsheet data found.

## 2019-12-27 ENCOUNTER — Inpatient Hospital Stay: Payer: Medicare Other | Attending: Hematology

## 2019-12-27 ENCOUNTER — Other Ambulatory Visit: Payer: Self-pay

## 2019-12-27 ENCOUNTER — Inpatient Hospital Stay (HOSPITAL_BASED_OUTPATIENT_CLINIC_OR_DEPARTMENT_OTHER): Payer: Medicare Other | Admitting: Hematology

## 2019-12-27 ENCOUNTER — Telehealth: Payer: Self-pay | Admitting: Hematology

## 2019-12-27 VITALS — BP 131/77 | HR 77 | Temp 98.5°F | Resp 16

## 2019-12-27 DIAGNOSIS — D631 Anemia in chronic kidney disease: Secondary | ICD-10-CM | POA: Insufficient documentation

## 2019-12-27 DIAGNOSIS — Z833 Family history of diabetes mellitus: Secondary | ICD-10-CM | POA: Diagnosis not present

## 2019-12-27 DIAGNOSIS — Z87440 Personal history of urinary (tract) infections: Secondary | ICD-10-CM | POA: Diagnosis not present

## 2019-12-27 DIAGNOSIS — Z806 Family history of leukemia: Secondary | ICD-10-CM | POA: Insufficient documentation

## 2019-12-27 DIAGNOSIS — D509 Iron deficiency anemia, unspecified: Secondary | ICD-10-CM

## 2019-12-27 DIAGNOSIS — Z86718 Personal history of other venous thrombosis and embolism: Secondary | ICD-10-CM | POA: Diagnosis not present

## 2019-12-27 DIAGNOSIS — G373 Acute transverse myelitis in demyelinating disease of central nervous system: Secondary | ICD-10-CM | POA: Insufficient documentation

## 2019-12-27 DIAGNOSIS — Z803 Family history of malignant neoplasm of breast: Secondary | ICD-10-CM | POA: Insufficient documentation

## 2019-12-27 DIAGNOSIS — N189 Chronic kidney disease, unspecified: Secondary | ICD-10-CM | POA: Diagnosis not present

## 2019-12-27 DIAGNOSIS — D563 Thalassemia minor: Secondary | ICD-10-CM | POA: Diagnosis not present

## 2019-12-27 DIAGNOSIS — D638 Anemia in other chronic diseases classified elsewhere: Secondary | ICD-10-CM | POA: Diagnosis not present

## 2019-12-27 LAB — CMP (CANCER CENTER ONLY)
ALT: 9 U/L (ref 0–44)
AST: 8 U/L — ABNORMAL LOW (ref 15–41)
Albumin: 3.3 g/dL — ABNORMAL LOW (ref 3.5–5.0)
Alkaline Phosphatase: 69 U/L (ref 38–126)
Anion gap: 9 (ref 5–15)
BUN: 31 mg/dL — ABNORMAL HIGH (ref 6–20)
CO2: 24 mmol/L (ref 22–32)
Calcium: 9.5 mg/dL (ref 8.9–10.3)
Chloride: 107 mmol/L (ref 98–111)
Creatinine: 1.86 mg/dL — ABNORMAL HIGH (ref 0.44–1.00)
GFR, Est AFR Am: 37 mL/min — ABNORMAL LOW (ref >60)
GFR, Estimated: 32 mL/min — ABNORMAL LOW (ref >60)
Glucose, Bld: 173 mg/dL — ABNORMAL HIGH (ref 70–99)
Potassium: 4.3 mmol/L (ref 3.5–5.1)
Sodium: 140 mmol/L (ref 135–145)
Total Bilirubin: <0.2 mg/dL — ABNORMAL LOW (ref 0.3–1.2)
Total Protein: 7.6 g/dL (ref 6.5–8.1)

## 2019-12-27 LAB — CBC WITH DIFFERENTIAL/PLATELET
Abs Immature Granulocytes: 0.02 10*3/uL (ref 0.00–0.07)
Basophils Absolute: 0 10*3/uL (ref 0.0–0.1)
Basophils Relative: 0 %
Eosinophils Absolute: 0.4 10*3/uL (ref 0.0–0.5)
Eosinophils Relative: 5 %
HCT: 40.1 % (ref 36.0–46.0)
Hemoglobin: 11.7 g/dL — ABNORMAL LOW (ref 12.0–15.0)
Immature Granulocytes: 0 %
Lymphocytes Relative: 49 %
Lymphs Abs: 3.3 10*3/uL (ref 0.7–4.0)
MCH: 24.8 pg — ABNORMAL LOW (ref 26.0–34.0)
MCHC: 29.2 g/dL — ABNORMAL LOW (ref 30.0–36.0)
MCV: 85 fL (ref 80.0–100.0)
Monocytes Absolute: 0.4 10*3/uL (ref 0.1–1.0)
Monocytes Relative: 6 %
Neutro Abs: 2.8 10*3/uL (ref 1.7–7.7)
Neutrophils Relative %: 40 %
Platelets: 252 10*3/uL (ref 150–400)
RBC: 4.72 MIL/uL (ref 3.87–5.11)
RDW: 16.4 % — ABNORMAL HIGH (ref 11.5–15.5)
WBC: 6.9 10*3/uL (ref 4.0–10.5)
nRBC: 0 % (ref 0.0–0.2)

## 2019-12-27 LAB — FERRITIN: Ferritin: 141 ng/mL (ref 11–307)

## 2019-12-27 LAB — IRON AND TIBC
Iron: 52 ug/dL (ref 41–142)
Saturation Ratios: 19 % — ABNORMAL LOW (ref 21–57)
TIBC: 271 ug/dL (ref 236–444)
UIBC: 219 ug/dL (ref 120–384)

## 2019-12-27 NOTE — Telephone Encounter (Signed)
Scheduled per 5/12 los. Printed AVS and calender.

## 2019-12-27 NOTE — Progress Notes (Signed)
HEMATOLOGY/ONCOLOGY CLINIC NOTE  Date of Service: 12/27/2019  Patient Care Team: Seward Carol, MD as PCP - General (Internal Medicine)  CHIEF COMPLAINTS/PURPOSE OF CONSULTATION:  Microcytic Anemia  HISTORY OF PRESENTING ILLNESS:   Traci [name] is a wonderful 44 y.o. female who has been referred to [place] by Dr. Merri Ray for evaluation and management of anemia. Her PCP is Dr. Delfina Redwood and her Neurologist is Dr. Tomi Likens. The pt reports that she is doing well overall.  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 She denies having a hx of anemia prior to being diagnosed in October 2019. She was not feeling fatigued when her Hgb was low in October. She denies heavy cycles, however, notes that she has irregular menstrual cycles. When she does have menstrual cycles they last 2-3 days with only the first day being heavy. She is no longer on meloxicam at this time.  She notes that she was sick recently and lost 40 lbs due to recurrent UTIs. She is not able to feel that her bladder is full, however, she wasn't diagnosed with neurogenic bladder. She is taking cranberry and probiotics pill BID, catheter twice a day, and following up with an urologist. She has had recurrent UTIs at least 1 every month. She is also on an iron pill and she has been on it for about 2.5 months at this time, this was started after her initial diagnosis. She is taking vitamin D as well. Additionally, she is taking Lyrica TID and senakot BID. She denies kidney issues.   The pt denies any new symptoms or changes in function over the past 6 months. She had skin breakdown to her left lateral lower extremity, due to wearing a boot, however, this has resolved. She denies issues with bed sores, leg swelling, recent infection, melena, blood in stools, bowel issues, joint pain/swelling, skin rashes, mouth sores, bone pain, and any other symptoms.   Most recent lab results  (07/26/18) of CBC w/diff is as follows: all values are WNL except for WBC at 11.9k, RBC at 2.81, HGB at 6.6, HCT at 20.6, MCV at 73.5, MCH at 23.4, RDW at 16.9, ANC at 7.4k. 08/03/18 Haptoglobin at 300 07/27/18 Ferritin at 322.5 07/27/18 Iron & TIBC revealed all values WNL except for Iron at 22, TIBC at 189, Transferrin saturation at 11.58 05/27/18 Hemoglobinopathy evaluation revealed HgbA at 98.1, HgbA2 at 1.9 05/27/2018, Hgb at 11.   On review of systems, pt reports she felt better after receiving the blood transfusion, however, she notes that it started her menstrual cycle, and denies abdominal pain, enlarged lymph nodes, back pain, and any other symptoms.   On PMHx the pt reports transverse myelitis, anemia x 3 months, left DVT on xarelto x 6 months (off now), sleep apnea (no sleep study yet). 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  On Family Hx the pt denies blood disorders in her family. She does have a family hx of cancer, with her extended family with cancers (uncle with leukemia and her cousin with a brain tumor), her paternal grandmother with breast cancer. Both her father and her brother have had a B12 deficiency. Her brother was paralyzed for several months due to a B12 deficiency. Both her father and her brother take B12 injections for this. She has a family hx of DM. She denies family hx of lupus or an  autoimmune disorder.   Interval History  Traci [name] is a 44 y.o. female presenting today for the management and evaluation of her microcytic anemia. The patient's last visit with [place] was on 06/29/2019. The pt reports that she is doing well overall.  The pt reports that she was hospitalized on 09/28/2019 for sepsis but is doing much better now. She has since been started on treatment for Diabetes and her ulcer has healed. Pt has a follow up with a Urologist to remove her catheter that was placed due to urinary retention. She  has been taking PO Iron and denies any stomach discomfort or bowel habit changes.   Lab results today (12/27/19) of CBC w/diff and CMP is as follows: all values are WNL except for Hgb at 11.7, MCH at 24.8, MCHC at 29.2, RDW at 16.4, Glucose at 173, BUN at 31, Creatinine at 1.86, Albumin at 3.3, AST at 8, Total Bilirubin at <0.2, GFR Est Non Af Am at 32. 12/27/2019 Iron Panel is a follows: Iron at 52, TIBC at 271, Sat Ratios at 19, UIBC at 219 12/27/2019 Ferritin at 141  On review of systems, pt denies blood/black stools, hematuria, gum bleeds, nose bleeds, fever, constipation, diarrhea, abdominal pain and any other symptoms.   MEDICAL HISTORY:  Past Medical History:  Diagnosis Date  . C. difficile colitis 09/24/2019  . H/O degenerative disc disease   . Obesity   . Paraplegia ([place])   . Transverse myelitis ([place])     SURGICAL HISTORY: Past Surgical History:  Procedure Laterality Date  . fibroid tumor    . KNEE ARTHROSCOPY    . TUBAL LIGATION      SOCIAL HISTORY: Social History   Socioeconomic History  . Marital status: Single    Spouse name: Not on file  . Number of children: 3  . Years of education: 68  . Highest education level: Not on file  Occupational History  . Occupation: cook  Tobacco Use  . Smoking status: Never Smoker  . Smokeless tobacco: Never Used  Substance and Sexual Activity  . Alcohol use: No  . Drug use: No  . Sexual activity: Not Currently    Birth control/protection: None  Other Topics Concern  . Not on file  Social History Narrative   Seperated, currently in [facility] nursing [facility].   Social Determinants of Health   Financial Resource Strain:   . Difficulty of Paying Living Expenses:   Food Insecurity:   . Worried About Charity fundraiser in the Last Year:   . Arboriculturist in the Last Year:   Transportation Needs:   . Film/video editor (Medical):   Marland Kitchen Lack of Transportation (Non-Medical):   Physical Activity:   . Days of  Exercise per Week:   . Minutes of Exercise per Session:   Stress:   . Feeling of Stress :   Social Connections:   . Frequency of Communication with Friends and Family:   . Frequency of Social Gatherings with Friends and Family:   . Attends Religious Services:   . Active Member of Clubs or Organizations:   . Attends Archivist Meetings:   Marland Kitchen Marital Status:   Intimate Partner Violence:   . Fear of Current or Ex-Partner:   . Emotionally Abused:   Marland Kitchen Physically Abused:   . Sexually Abused:     FAMILY HISTORY: Family History  Problem Relation Age of Onset  . Diabetes Mother   . Diabetes Father  ALLERGIES:  has No Known Allergies.  MEDICATIONS:  Current Outpatient Medications  Medication Sig Dispense Refill  . acetaminophen (TYLENOL) 325 MG tablet Take 650 mg by mouth every 6 (six) hours as needed for mild pain.     Marland Kitchen albuterol (VENTOLIN HFA) 108 (90 Base) MCG/ACT inhaler Inhale 2 puffs into the lungs every 6 (six) hours as needed for wheezing or shortness of breath.    Marland Kitchen ascorbic acid (VITAMIN C) 500 MG tablet Take 500 mg by mouth 2 (two) times daily.    . benzocaine-menthol (CHLORASEPTIC) 6-10 MG lozenge Take 1 lozenge by mouth every 4 (four) hours as needed for sore throat.    . cholecalciferol (VITAMIN D) 25 MCG (1000 UNIT) tablet Take 1,000 Units by mouth daily.    . Cholecalciferol (VITAMIN D) 50 MCG (2000 UT) CAPS Take 2,000 Units by mouth daily.     . Cranberry 250 MG CAPS Take 1,000 mg by mouth 2 (two) times daily.     . diphenhydrAMINE (BENADRYL) 25 mg capsule Take 25 mg by mouth every 6 (six) hours as needed for itching.    . Ergocalciferol (VITAMIN D2) 50 MCG (2000 UT) TABS Take 2,000 Units by mouth daily.    Marland Kitchen guaiFENesin (MUCINEX) 600 MG 12 hr tablet Take 600 mg by mouth 2 (two) times daily.    . Infant Care Products (DERMACLOUD) CREA Apply 1 application topically 2 (two) times daily as needed (irritation). Apply to buttocks as needed for protection    .  iron polysaccharides (NIFEREX) 150 MG capsule Take 150 mg by mouth 2 (two) times daily.     Marland Kitchen LACTOBACILLUS PO Take 1 capsule by mouth 2 (two) times daily.     Marland Kitchen loperamide (IMODIUM) 2 MG capsule Take 2 mg by mouth every 4 (four) hours as needed for diarrhea or loose stools. Max 8 capsules per 24 hours    . loratadine (CLARITIN REDITABS) 10 MG dissolvable tablet Take 10 mg by mouth daily.    . Melatonin 3 MG TABS Take 3 mg by mouth at bedtime.    . methocarbamol (ROBAXIN) 500 MG tablet Take 500 mg by mouth every 8 (eight) hours as needed for muscle spasms.    . pregabalin (LYRICA) 150 MG capsule Take 150 mg by mouth in the morning, at noon, and at bedtime.    . saccharomyces boulardii (FLORASTOR) 250 MG capsule Take 250 mg by mouth 2 (two) times daily.    . sodium bicarbonate 650 MG tablet Take 1 tablet (650 mg total) by mouth 2 (two) times daily. 100 tablet 1  . sodium chloride (OCEAN) 0.65 % SOLN nasal spray Place 1 spray into both nostrils every 2 (two) hours as needed (nasal dryness).    . traMADol (ULTRAM) 50 MG tablet Take 1 tablet (50 mg total) by mouth every 4 (four) hours as needed for severe pain. 15 tablet 0  . Zinc Gluconate 100 MG TABS Take 100 mg by mouth daily.     No current facility-administered medications for this visit.    REVIEW OF SYSTEMS:   A 10+ POINT REVIEW OF SYSTEMS WAS OBTAINED including neurology, dermatology, psychiatry, cardiac, respiratory, lymph, extremities, GI, GU, Musculoskeletal, constitutional, breasts, reproductive, HEENT.  All pertinent positives are noted in the HPI.  All others are negative.   PHYSICAL EXAMINATION: ECOG FS:2 - Symptomatic, <50% confined to bed  Vitals:   12/27/19 1121  BP: 131/77  Pulse: 77  Resp: 16  Temp: 98.5 F (36.9 C)  SpO2: 99%  Wt Readings from Last 3 Encounters:  10/02/19 (!) 306 lb 7 oz (139 kg)  03/29/19 260 lb (117.9 kg)  09/15/18 251 lb 9.6 oz (114.1 kg)   There is no height or weight on file to calculate  BMI.    GENERAL:alert, in no acute distress and comfortable SKIN: no acute rashes, no significant lesions EYES: conjunctiva are pink and non-injected, sclera anicteric OROPHARYNX: MMM, no exudates, no oropharyngeal erythema or ulceration NECK: supple, no JVD LYMPH:  no palpable lymphadenopathy in the cervical, axillary or inguinal regions LUNGS: clear to auscultation b/l with normal respiratory effort HEART: regular rate & rhythm ABDOMEN:  normoactive bowel sounds , non tender, not distended. No palpable hepatosplenomegaly.  Extremity: 2+ pedal edema b/l PSYCH: alert & oriented x 3 with fluent speech NEURO: no focal motor/sensory deficits  LABORATORY DATA:  I have reviewed the data as listed  . CBC Latest Ref Rng & Units 12/27/2019 10/03/2019 10/02/2019  WBC 4.0 - 10.5 K/uL 6.9 19.4(H) 21.3(H)  Hemoglobin 12.0 - 15.0 g/dL 11.7(L) 7.4(L) 7.5(L)  Hematocrit 36.0 - 46.0 % 40.1 24.3(L) 24.5(L)  Platelets 150 - 400 K/uL 252 559(H) 592(H)   . CBC    Component Value Date/Time   WBC 6.9 12/27/2019 1014   RBC 4.72 12/27/2019 1014   HGB 11.7 (L) 12/27/2019 1014   HGB 8.5 (L) 03/29/2019 1355   HCT 40.1 12/27/2019 1014   PLT 252 12/27/2019 1014   PLT 272 03/29/2019 1355   MCV 85.0 12/27/2019 1014   MCH 24.8 (L) 12/27/2019 1014   MCHC 29.2 (L) 12/27/2019 1014   RDW 16.4 (H) 12/27/2019 1014   LYMPHSABS 3.3 12/27/2019 1014   MONOABS 0.4 12/27/2019 1014   EOSABS 0.4 12/27/2019 1014   BASOSABS 0.0 12/27/2019 1014     . CMP Latest Ref Rng & Units 12/27/2019 10/03/2019 10/02/2019  Glucose 70 - 99 mg/dL 173(H) 122(H) 129(H)  BUN 6 - 20 mg/dL 31(H) 40(H) 43(H)  Creatinine 0.44 - 1.00 mg/dL 1.86(H) 2.01(H) 2.20(H)  Sodium 135 - 145 mmol/L 140 138 140  Potassium 3.5 - 5.1 mmol/L 4.3 3.6 3.4(L)  Chloride 98 - 111 mmol/L 107 107 107  CO2 22 - 32 mmol/L 24 21(L) 22  Calcium 8.9 - 10.3 mg/dL 9.5 8.1(L) 8.0(L)  Total Protein 6.5 - 8.1 g/dL 7.6 - -  Total Bilirubin 0.3 - 1.2 mg/dL <0.2(L) - -   Alkaline Phos 38 - 126 U/L 69 - -  AST 15 - 41 U/L 8(L) - -  ALT 0 - 44 U/L 9 - -   . Lab Results  Component Value Date   IRON 52 12/27/2019   TIBC 271 12/27/2019   IRONPCTSAT 19 (L) 12/27/2019   (Iron and TIBC)  Lab Results  Component Value Date   FERRITIN 141 12/27/2019   . Lab Results  Component Value Date   RETICCTPCT 3.2 (H) 03/29/2019   RBC 4.72 12/27/2019   . Lab Results  Component Value Date   LDH 170 03/29/2019   Sed rate 96  09/15/2018 Alpha Thalassemia:   08/03/18 Haptoglobin:   07/27/18 Iron, TIBC, Ferritin:   07/26/18 CBC w/diff:   05/27/18 Hemoglobinopathy Evaluation:    RADIOGRAPHIC STUDIES: I have personally reviewed the radiological images as listed and agreed with the findings in the report. No results found.  ASSESSMENT & PLAN:  44 y.o. female with  1. Microcytic Anemia Likely multifactorial but primarily due to CKD, anemia of chronic inflammation. Alpha thal trait.   2. Alpha thalassemia  trait   Plan:  -Discussed pt labwork today, 12/27/19; blood counts have improved, blood chemistries are stable, Ferritin is WNL -Discussed 12/27/2019 Iron Panel is a follows: Iron at 52, TIBC at 271, Sat Ratios at 19, UIBC at 219 -No IV Iron needed at this time.  -Continue PO Iron and a B-complex vitamin - 1 capsule daily  -Will see back in 12 months with labs     FOLLOW UP: RTC with Dr Irene Limbo with labs in 12 months   The total time spent in the appt was 20 minutes and more than 50% was on counseling and direct patient cares.  All of the patient's questions were answered with apparent satisfaction. The patient knows to call the clinic with any problems, questions or concerns.   Sullivan Lone MD [place] AAHIVMS [facility] Hematology/Oncology Physician [facility]  (Office):       [phone] (Work cell):  [phone] (Fax):           [phone]  12/27/2019 4:03 PM   I, Yevette Edwards, am acting as a scribe for Dr. Sullivan Lone.   .I have reviewed the above documentation for accuracy and completeness, and I agree with the above. Brunetta Genera MD

## 2020-01-09 ENCOUNTER — Telehealth: Payer: Self-pay | Admitting: Hematology

## 2020-01-09 NOTE — Telephone Encounter (Signed)
Faxed medical records to [facility] @ fax#[phone]

## 2020-04-08 ENCOUNTER — Other Ambulatory Visit: Payer: Self-pay | Admitting: Urology

## 2020-04-19 NOTE — Progress Notes (Signed)
Per Tiffany NP, at [place] home, Ms. Hsu "no longer want to have her surgery". Contact number for  [facility] provided to NP, who stated that she would contact Dr. Carlton Adam office and relay this information to them.   Coni Mabe at [facility] contacted regarding the above conversation with NP. Coni will await NP's call, and advise Dr. Louis Meckel of the patient's decision.

## 2020-04-24 ENCOUNTER — Encounter (HOSPITAL_COMMUNITY): Admission: RE | Payer: Self-pay | Source: Home / Self Care

## 2020-04-24 ENCOUNTER — Ambulatory Visit (HOSPITAL_COMMUNITY): Admission: RE | Admit: 2020-04-24 | Payer: Medicare Other | Source: Home / Self Care | Admitting: Urology

## 2020-04-24 SURGERY — INSERTION, SUPRAPUBIC CATHETER
Anesthesia: General

## 2020-12-26 ENCOUNTER — Other Ambulatory Visit: Payer: Medicare Other

## 2020-12-26 ENCOUNTER — Ambulatory Visit: Payer: Medicare Other | Admitting: Hematology

## 2022-01-28 IMAGING — MR MR SACRUM / SI JOINTS WO CM
4 of 5 series · 19 of 48 positions shown · non-contrast
Comparison: CT scan of the abdomen and pelvis dated 09/28/2019

CLINICAL DATA: Decubitus ulcer over the sacrum.

EXAM:
MRI SACRUM WITHOUT CONTRAST
TECHNIQUE: Multiplanar, multisequence MR imaging of the sacrum was performed.
No intravenous contrast was administered.

[Series 4: T1 · oblique · 4.0mm · 0.59mm/px · 10 of 40 slices shown (1 of 2)]
[im 1/40]
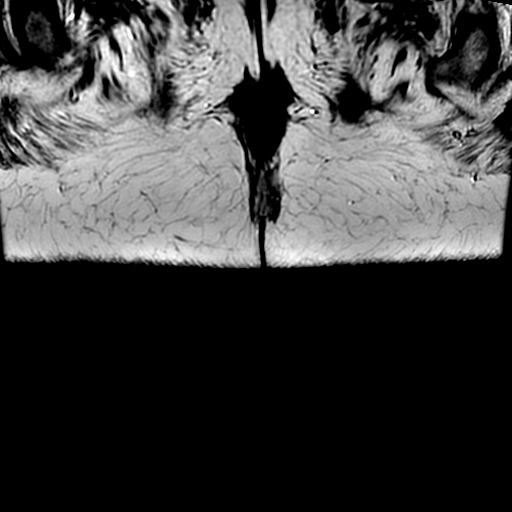
[im 4/40]
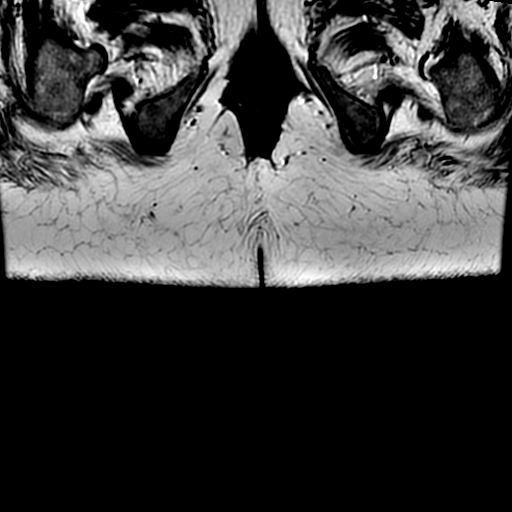
[im 8/40]
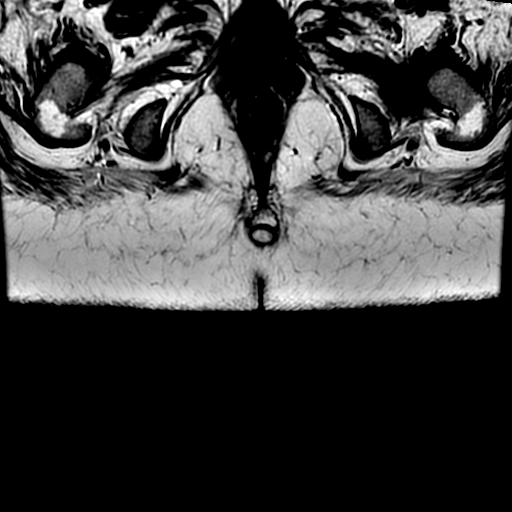
[im 12/40]
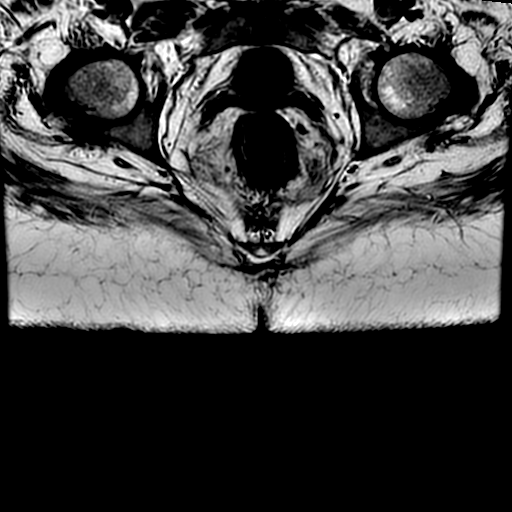
[im 16/40]
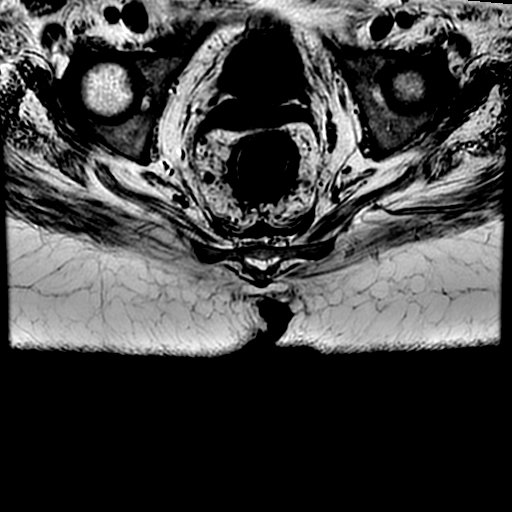
[im 20/40]
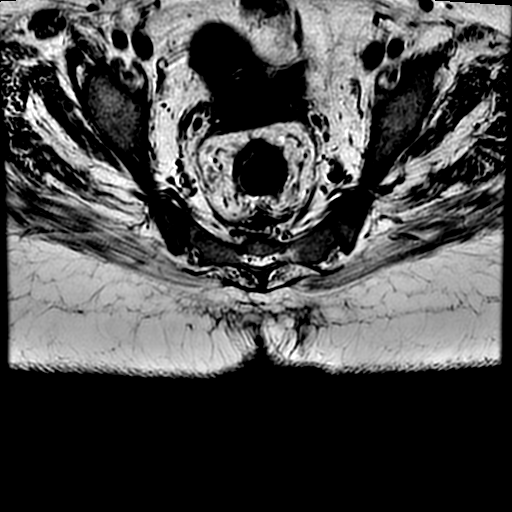
[im 24/40]
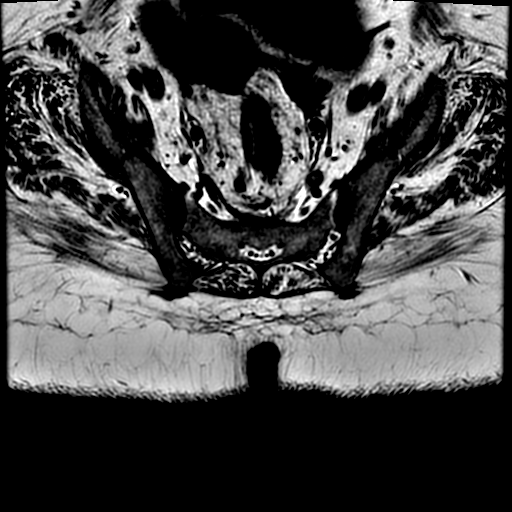
[im 28/40]
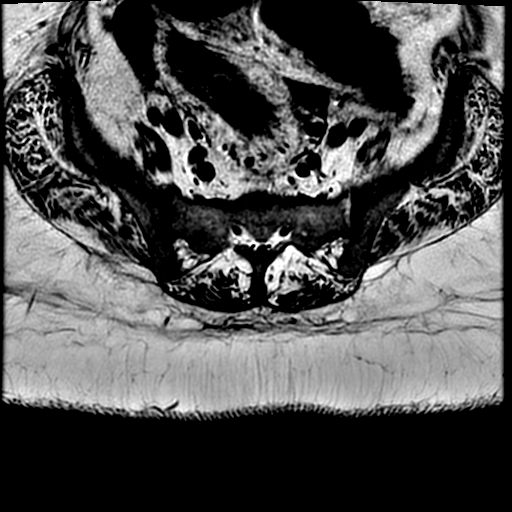
[im 32/40]
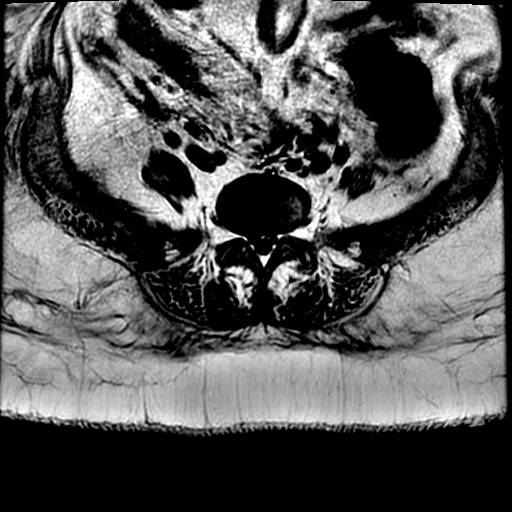
[im 36/40]
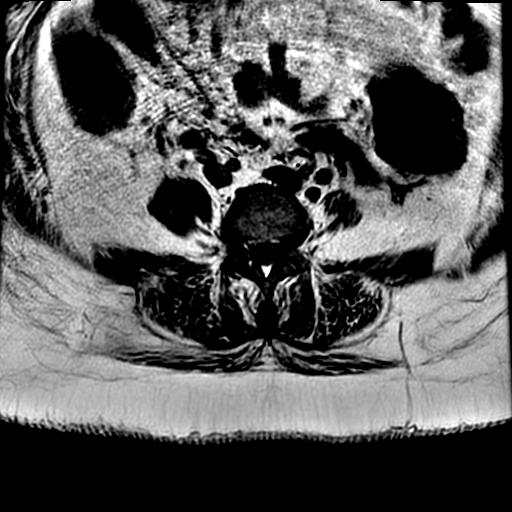

[Series 5: T2 fat-sat · oblique · 4.0mm · 0.59mm/px · 3 of 40 slices shown (1 of 2)]
[im 4/40]
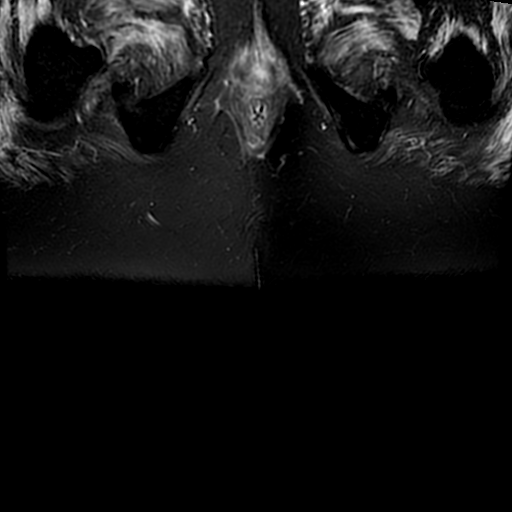
[im 20/40]
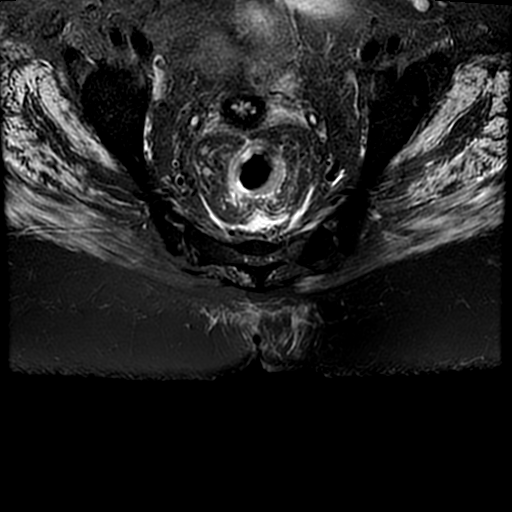
[im 36/40]
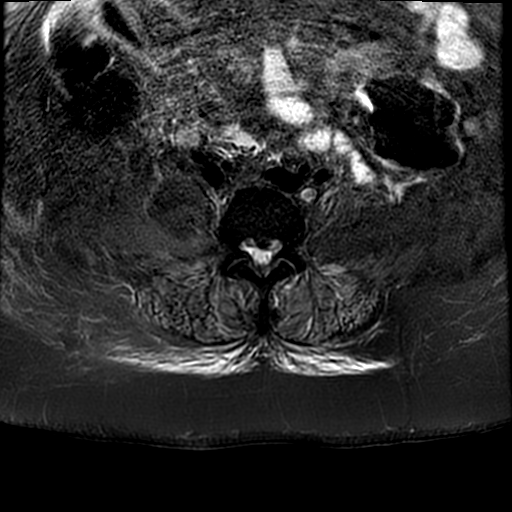

[Series 6: T2 fat-sat · sagittal · 4.0mm · 0.53mm/px · 3 of 38 slices shown (2 of 2)]
[im 5/38]
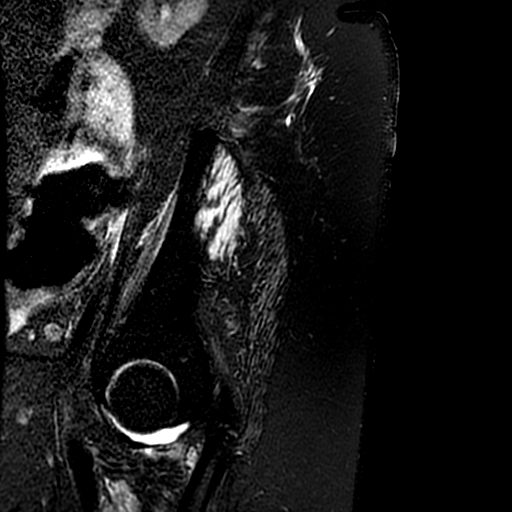
[im 21/38]
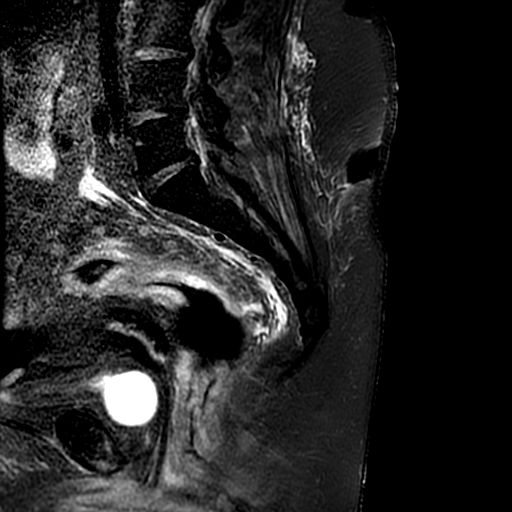
[im 33/38]
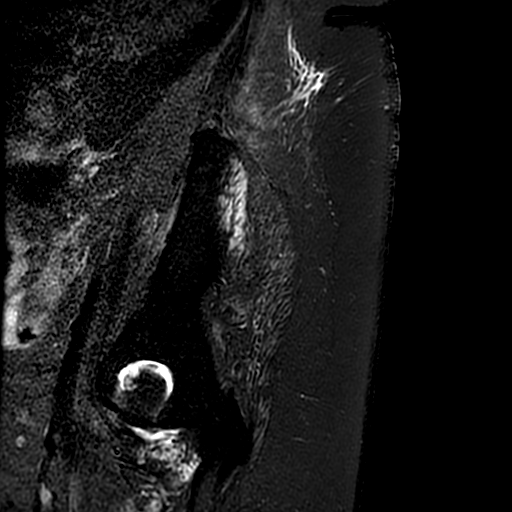

[Series 7: T1 · oblique · 3.0mm · 0.66mm/px · 3 of 32 slices shown (2 of 2)]
[im 5/32]
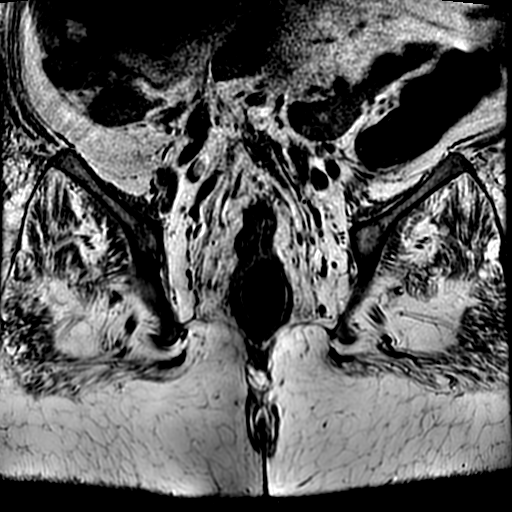
[im 18/32]
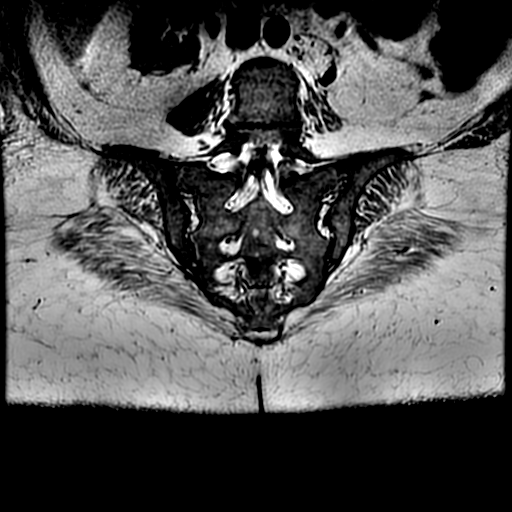
[im 27/32]
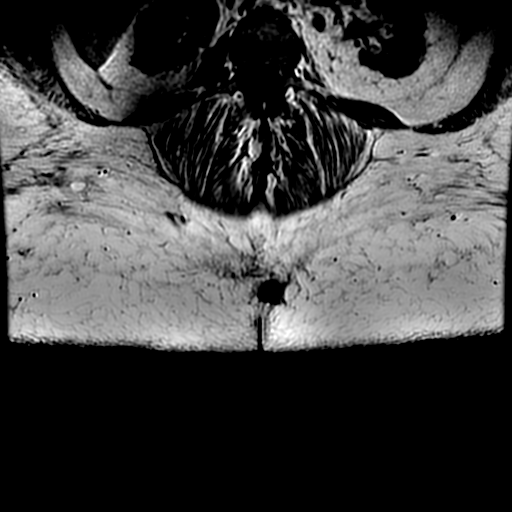

[19 of 48 positions shown; findings below may reference images not displayed]

FINDINGS: There are 2 decubitus ulcers in the midline posteriorly superficial
to the sacrum. Each of these is approximately 3 cm deep with a
communication between the 2. The ulcers do not extend to the bony
sacrum or to the paraspinal musculature. There are no abscesses.

No osteomyelitis.

Note is made of bilateral hip effusions. The bones of the hips
appear normal.

There is a focal soft disc protrusion to the left of midline at L4-5
which might affect the left L5 nerve.

No adenopathy or mass lesions. Foley catheter in the bladder.

There is diffuse mucosal thickening in the colon consistent with the
patient's history of C difficile colitis. Nonspecific slight
presacral edema.
IMPRESSION: 1. No evidence of osteomyelitis or soft tissue abscess.
2. 2 decubitus ulcers in the midline posteriorly superficial to the
sacrum.
3. Focal soft disc protrusion to the left of midline at L4-5 which
might affect the left L5 nerve.
4. Diffuse mucosal thickening in the colon consistent with the
patient's history of C difficile colitis.
